# Patient Record
Sex: Female | Born: 1945 | Race: White | Hispanic: No | Smoking: Former smoker
Health system: Southern US, Community
[De-identification: ages and names within clinical notes are randomized; demographics above are authoritative.]

## PROBLEM LIST (undated history)

## (undated) DIAGNOSIS — G629 Polyneuropathy, unspecified: Secondary | ICD-10-CM

## (undated) DIAGNOSIS — F32A Depression, unspecified: Secondary | ICD-10-CM

## (undated) DIAGNOSIS — E039 Hypothyroidism, unspecified: Secondary | ICD-10-CM

## (undated) DIAGNOSIS — K219 Gastro-esophageal reflux disease without esophagitis: Secondary | ICD-10-CM

## (undated) DIAGNOSIS — F329 Major depressive disorder, single episode, unspecified: Secondary | ICD-10-CM

## (undated) DIAGNOSIS — J45909 Unspecified asthma, uncomplicated: Secondary | ICD-10-CM

## (undated) HISTORY — DX: Polyneuropathy, unspecified: G62.9

## (undated) HISTORY — PX: ABDOMINAL HYSTERECTOMY: SHX81

## (undated) HISTORY — PX: NASAL SINUS SURGERY: SHX719

## (undated) HISTORY — PX: LUMBAR LAMINECTOMY: SHX95

## (undated) HISTORY — PX: CATARACT EXTRACTION BILATERAL W/ ANTERIOR VITRECTOMY: SHX1304

## (undated) HISTORY — PX: NECK SURGERY: SHX720

## (undated) HISTORY — PX: CHOLECYSTECTOMY: SHX55

## (undated) HISTORY — PX: CERVICAL LAMINECTOMY: SHX94

## (undated) HISTORY — DX: Gastro-esophageal reflux disease without esophagitis: K21.9

---

## 2001-08-04 ENCOUNTER — Encounter: Payer: Self-pay | Admitting: Family Medicine

## 2001-08-04 ENCOUNTER — Encounter: Admission: RE | Admit: 2001-08-04 | Discharge: 2001-08-04 | Payer: Self-pay | Admitting: Family Medicine

## 2006-02-23 ENCOUNTER — Inpatient Hospital Stay (HOSPITAL_COMMUNITY): Admission: EM | Admit: 2006-02-23 | Discharge: 2006-03-01 | Payer: Self-pay | Admitting: Emergency Medicine

## 2006-08-06 ENCOUNTER — Emergency Department (HOSPITAL_COMMUNITY): Admission: EM | Admit: 2006-08-06 | Discharge: 2006-08-06 | Payer: Self-pay | Admitting: Emergency Medicine

## 2010-10-17 ENCOUNTER — Inpatient Hospital Stay (HOSPITAL_COMMUNITY): Admission: EM | Admit: 2010-10-17 | Discharge: 2010-10-26 | Payer: Self-pay | Admitting: Emergency Medicine

## 2010-10-19 DIAGNOSIS — F331 Major depressive disorder, recurrent, moderate: Secondary | ICD-10-CM

## 2010-10-20 ENCOUNTER — Encounter (INDEPENDENT_AMBULATORY_CARE_PROVIDER_SITE_OTHER): Payer: Self-pay | Admitting: Internal Medicine

## 2010-10-26 ENCOUNTER — Inpatient Hospital Stay (HOSPITAL_COMMUNITY)
Admission: EM | Admit: 2010-10-26 | Discharge: 2010-10-30 | Payer: Self-pay | Source: Ambulatory Visit | Admitting: Psychiatry

## 2010-10-26 ENCOUNTER — Ambulatory Visit: Payer: Self-pay | Admitting: Psychiatry

## 2011-02-09 LAB — CBC
HCT: 33.8 % — ABNORMAL LOW (ref 36.0–46.0)
Hemoglobin: 10.5 g/dL — ABNORMAL LOW (ref 12.0–15.0)
Hemoglobin: 12.2 g/dL (ref 12.0–15.0)
MCH: 31.3 pg (ref 26.0–34.0)
MCH: 31.5 pg (ref 26.0–34.0)
MCHC: 34.1 g/dL (ref 30.0–36.0)
MCHC: 34.3 g/dL (ref 30.0–36.0)
MCV: 90.4 fL (ref 78.0–100.0)
MCV: 91.1 fL (ref 78.0–100.0)
MCV: 92.5 fL (ref 78.0–100.0)
Platelets: 217 10*3/uL (ref 150–400)
Platelets: 250 10*3/uL (ref 150–400)
Platelets: 254 10*3/uL (ref 150–400)
Platelets: 271 10*3/uL (ref 150–400)
Platelets: 344 10*3/uL (ref 150–400)
RBC: 3.3 MIL/uL — ABNORMAL LOW (ref 3.87–5.11)
RBC: 3.53 MIL/uL — ABNORMAL LOW (ref 3.87–5.11)
RBC: 3.91 MIL/uL (ref 3.87–5.11)
RBC: 4.26 MIL/uL (ref 3.87–5.11)
RDW: 15 % (ref 11.5–15.5)
RDW: 15.8 % — ABNORMAL HIGH (ref 11.5–15.5)
RDW: 15.9 % — ABNORMAL HIGH (ref 11.5–15.5)
WBC: 10.6 10*3/uL — ABNORMAL HIGH (ref 4.0–10.5)
WBC: 11.9 10*3/uL — ABNORMAL HIGH (ref 4.0–10.5)
WBC: 9.2 10*3/uL (ref 4.0–10.5)

## 2011-02-09 LAB — BASIC METABOLIC PANEL
BUN: 1 mg/dL — ABNORMAL LOW (ref 6–23)
BUN: 1 mg/dL — ABNORMAL LOW (ref 6–23)
BUN: 1 mg/dL — ABNORMAL LOW (ref 6–23)
BUN: 2 mg/dL — ABNORMAL LOW (ref 6–23)
BUN: 4 mg/dL — ABNORMAL LOW (ref 6–23)
CO2: 25 mEq/L (ref 19–32)
CO2: 27 mEq/L (ref 19–32)
Calcium: 7.4 mg/dL — ABNORMAL LOW (ref 8.4–10.5)
Calcium: 8.4 mg/dL (ref 8.4–10.5)
Calcium: 8.5 mg/dL (ref 8.4–10.5)
Chloride: 100 mEq/L (ref 96–112)
Chloride: 106 mEq/L (ref 96–112)
Chloride: 107 mEq/L (ref 96–112)
Chloride: 107 mEq/L (ref 96–112)
Chloride: 109 mEq/L (ref 96–112)
Creatinine, Ser: 0.55 mg/dL (ref 0.4–1.2)
Creatinine, Ser: 0.65 mg/dL (ref 0.4–1.2)
Creatinine, Ser: 0.66 mg/dL (ref 0.4–1.2)
Creatinine, Ser: 0.68 mg/dL (ref 0.4–1.2)
Creatinine, Ser: 0.68 mg/dL (ref 0.4–1.2)
Creatinine, Ser: 0.71 mg/dL (ref 0.4–1.2)
GFR calc Af Amer: >60 mL/min (ref >60)
GFR calc Af Amer: >60 mL/min (ref >60)
GFR calc Af Amer: >60 mL/min (ref >60)
GFR calc non Af Amer: >60 mL/min (ref >60)
GFR calc non Af Amer: >60 mL/min (ref >60)
GFR calc non Af Amer: >60 mL/min (ref >60)
GFR calc non Af Amer: >60 mL/min (ref >60)
Glucose, Bld: 76 mg/dL (ref 70–99)
Glucose, Bld: 77 mg/dL (ref 70–99)
Glucose, Bld: 93 mg/dL (ref 70–99)
Potassium: 2.5 mEq/L — CL (ref 3.5–5.1)
Potassium: 3.6 mEq/L (ref 3.5–5.1)
Potassium: 4.6 mEq/L (ref 3.5–5.1)
Potassium: 5.3 mEq/L — ABNORMAL HIGH (ref 3.5–5.1)

## 2011-02-09 LAB — COMPREHENSIVE METABOLIC PANEL
ALT: 10 U/L (ref 0–35)
ALT: 11 U/L (ref 0–35)
AST: 28 U/L (ref 0–37)
AST: 31 U/L (ref 0–37)
Albumin: 1.9 g/dL — ABNORMAL LOW (ref 3.5–5.2)
Albumin: 2.6 g/dL — ABNORMAL LOW (ref 3.5–5.2)
Alkaline Phosphatase: 119 U/L — ABNORMAL HIGH (ref 39–117)
BUN: 1 mg/dL — ABNORMAL LOW (ref 6–23)
CO2: 21 mEq/L (ref 19–32)
Calcium: 8.3 mg/dL — ABNORMAL LOW (ref 8.4–10.5)
Chloride: 111 mEq/L (ref 96–112)
Chloride: 95 mEq/L — ABNORMAL LOW (ref 96–112)
GFR calc Af Amer: >60 mL/min (ref >60)
GFR calc non Af Amer: >60 mL/min (ref >60)
Potassium: 2.4 mEq/L — CL (ref 3.5–5.1)
Sodium: 140 mEq/L (ref 135–145)
Sodium: 142 mEq/L (ref 135–145)
Total Bilirubin: 0.8 mg/dL (ref 0.3–1.2)

## 2011-02-09 LAB — MAGNESIUM
Magnesium: 1.6 mg/dL (ref 1.5–2.5)
Magnesium: 1.9 mg/dL (ref 1.5–2.5)
Magnesium: 2.5 mg/dL (ref 1.5–2.5)
Magnesium: 3.1 mg/dL — ABNORMAL HIGH (ref 1.5–2.5)

## 2011-02-09 LAB — DIFFERENTIAL
Basophils Absolute: 0 10*3/uL (ref 0.0–0.1)
Basophils Relative: 0 % (ref 0–1)
Eosinophils Absolute: 0 10*3/uL (ref 0.0–0.7)
Eosinophils Relative: 0 % (ref 0–5)
Monocytes Absolute: 0.4 10*3/uL (ref 0.1–1.0)
Neutro Abs: 7.3 10*3/uL (ref 1.7–7.7)

## 2011-02-09 LAB — URINALYSIS, ROUTINE W REFLEX MICROSCOPIC
Bilirubin Urine: NEGATIVE
Glucose, UA: NEGATIVE mg/dL
Hgb urine dipstick: NEGATIVE
Protein, ur: 30 mg/dL — AB
Urobilinogen, UA: 1 mg/dL (ref 0.0–1.0)

## 2011-02-09 LAB — T4, FREE: Free T4: 0.78 ng/dL — ABNORMAL LOW (ref 0.80–1.80)

## 2011-02-09 LAB — CLOSTRIDIUM DIFFICILE BY PCR: Toxigenic C. Difficile by PCR: NOT DETECTED

## 2011-02-09 LAB — URINE MICROSCOPIC-ADD ON

## 2011-02-09 LAB — VITAMIN B12: Vitamin B-12: 737 pg/mL (ref 211–911)

## 2011-04-16 NOTE — H&P (Signed)
Elaine Byrd, Elaine Byrd                  ACCOUNT NO.:  [id number]   MEDICAL RECORD NO.:  [id number]          PATIENT TYPE:  INP   LOCATION:  6729                         FACILITY:  [facility]   PHYSICIAN:  Jonna L. Robb Matar, M.D.DATE OF BIRTH:  04/21/1946   DATE OF ADMISSION:  02/23/2006  DATE OF DISCHARGE:                                HISTORY & PHYSICAL   PRIMARY CARE PHYSICIAN:  Talmadge Coventry, M.D.   PSYCHIATRIST:  Dr. Lorin Picket   CHIEF COMPLAINT:  Cough.   HISTORY:  This 65 year old chronic asthmatic has had a nonproductive cough  for the last week-and-a-half with 3 days of increasing shortness of breath  and worsening asthma.  The cough is loose but nonproductive.  She has had  some low-grade chills and fever, some sinus congestion, some achiness.   PAST MEDICAL HISTORY:  1.  Asthma for the last 8 years.  2.  Allergies.  3.  Hypothyroidism.  4.  Bipolar.   OPERATIONS:  1.  Cervical and lumbar laminectomies.  2.  Tonsillectomy.  3.  Hysterectomy.  4.  Cholecystectomy.  5.  Sinus surgery.   FAMILY HISTORY:  Bipolar and anxiety.  Mother had COPD.  Father died of  MRSA.  One sister had severe degenerative disc disease and died by suicide.   SOCIAL HISTORY:  She has one daughter who has asthma and anxiety, a son who  has asthma and bipolar.  She smoked one pack per day and quit about 25 years  ago.  Alcohol:  She has three glasses of wine or beer on most nights but has  no history of DTs or withdrawal symptoms.   DRUG ALLERGIES:  1.  MORPHINE makes her go into [place].  2.  SULFA makes her itch.  3.  PENICILLIN gives her a rash.   MEDICATIONS:  1.  Advair 250/50 b.i.d.  2.  Albuterol p.r.n.  3.  Depakote 1000 daily.  4.  Lamictal 100 daily.  5.  Pepcid 20 daily.  6.  Synthroid 75 daily.  7.  Singulair 10 daily.  8.  Claritin D daily.  9.  Lexapro 20 daily.  10. Klonopin 1 mg up to t.i.d. p.r.n.   REVIEW OF SYSTEMS:  Twelve systems were reviewed.  Other than the  above,  there was no further history.   PHYSICAL EXAMINATION:  VITAL SIGNS:  Temperature 97 degrees, pulse 83,  respirations 20, blood pressure 105/79, 92% O2 saturation on 3 L.  GENERAL:  Well-developed Caucasian female in mild respiratory distress with  a cough.  HEENT:  Conjunctivae and lids are normal.  Pupils reactive.  Extraocular  movements are full.  She has normal hearing, mucosa, and pharynx.  NECK:  Show no mass or thyromegaly.  RESPIRATORY:  Effort slightly increased.  LUNGS:  Have some scattered expiratory wheeze. There are crackles on the  right side of the posterior lung field in the middle and some on the  axillary line.  There is no dullness.  HEART:  She has a regular rate and rhythm, normal S1 and S2, without  murmurs, rubs, or gallops.  There are no bruits, cyanosis, clubbing or  edema.  BREASTS:  Show no masses or tenderness, no discharge.  ABDOMEN:  Nontender.  No hepatosplenomegaly or hernia.  There is no cervical  adenopathy.  MUSCULOSKELETAL:  Muscle strength is 5/5.  Full range of motion all four  extremities.  SKIN:  Shows no rash, lesions or nodules.  NEUROLOGIC:  Cranial nerves are intact.  DTRs are 2+ in the knees.  Sensation is normal.  The patient is alert and oriented x3.  Normal memory,  judgment, pleasant and slightly humorous affect.   LABORATORY WORK:  Chest x-ray showed right middle lobe pneumonia and COPD.  White count 8.5 with a normal differential.  Normal CMP.  Alcohol is less  than 5.   IMPRESSION:  1.  Pneumonia.  Since she is allergic to PENICILLIN I will put her on      Avelox.  2.  Acute-on-chronic asthma.  I will give her just 3 days of lowered-dose      steroids since I do not want the corticosteroids to exacerbate any      psychiatric issues.  3.  Bipolar.  4.  Hypothyroidism.  Check her thyroid levels.  5.  History of daily alcohol use without any history of withdrawal or      delirium tremens.  She does have Klonopin to use  for agitation.      Jonna L. Robb Matar, M.D.  Electronically Signed    JLB/MEDQ  D:  02/23/2006  T:  02/24/2006  Job:  [id number]   cc:   Talmadge Coventry, M.D.  Fax: [phone]

## 2011-04-16 NOTE — Discharge Summary (Signed)
Elaine Byrd, Elaine Byrd                  ACCOUNT NO.:  [id number]   MEDICAL RECORD NO.:  [id number]          PATIENT TYPE:  INP   LOCATION:  6729                         FACILITY:  [facility]   PHYSICIAN:  Elliot Cousin, M.D.    DATE OF BIRTH:  June 12, 1946   DATE OF ADMISSION:  02/23/2006  DATE OF DISCHARGE:  02/28/2006                                 DISCHARGE SUMMARY   DISCHARGE DIAGNOSES:  1.  Right upper lobe and right lower lobe pneumonia.  2.  Subpleural 5 mm left lower lobe nodule per computed tomography scan of      the chest on February 24, 2006.  RECOMMEND FOLLOW-UP COMPUTED TOMOGRAPHY      SCAN IN 6 MONTHS.  3.  Superimposed asthma.  4.  Hypothyroidism.  5.  Mild hyperglycemia secondary to steroids.  6.  Bipolar disorder.   DISCHARGE MEDICATIONS:  1.  Avelox 400 mg daily for a total of 10 days of therapy.  2.  Albuterol MDI 2 puffs q.4 h. as needed.  3.  Advair inhaler 250/50, 1 puff b.i.d.  4.  Prednisone dose taper, take as directed.  5.  Singulair 10 mg daily.  6.  Depakote 1,000 mg daily.  7.  Lamictal 100 mg daily.  8.  Lexapro 20 mg daily.  9.  Pepcid 20 mg daily.  10. Klonopin 1 mg t.i.d. p.r.n.  11. Synthroid 75 mcg daily.   PROCEDURES PERFORMED:  1.  Chest x-ray on February 27, 2006.  The results revealed stable chest x-ray      to slightly decreased right upper lobe infiltrate.  Otherwise, no      significant change.  2.  CT scan of the chest without contrast on February 24, 2006.  The results      revealed mixed interstitial and air space opacities in the right upper      lobe, with bronchial thickening, concerning for pneumonia or      bronchopneumonia.  Associated right upper lobe posterior atelectasis.      Medial superior segment right lower lobe band-like consolidative      pneumonia versus atelectasis.  Subpleural 5 mm left lower lobe nodule.      Recommend followup in 6 months.  3.  Chest x-ray on February 23, 2006.  Findings consistent with right mid-lung      zone  pneumonia.  This may involve the superior segment of the right      lower lobe.  Chronic obstructive pulmonary disease.   HISTORY OF PRESENT ILLNESS:  The patient is a 65 year old lady with a past  medical history significant for asthma, who presented to the emergency  department on February 23, 2006 with a chief complaint of shortness of breath,  worsening asthma symptoms, and a nonproductive cough.  The patient also had  associated low-grade fever and chills.  When she was evaluated in the  emergency department, her chest x-ray revealed right middle lobe pneumonia  and COPD. The patient was therefore admitted for further evaluation and  management.   HOSPITAL COURSE:  1.  RIGHT-SIDED PNEUMONIA.  Blood  cultures were ordered from the emergency      department.  The patient was started on antibiotic therapy with Avelox      400 mg IV daily.  Further treatment was started with albuterol and      Atrovent nebulizers q.4 h.  Advair was also continued at 250/50 mg, 1      inhalation b.i.d.  Symptomatic treatment was started with Robitussin-DM      as needed as well as Humibid LA 1 tablet b.i.d.  Singulair was continued      for chronic asthma management.  Solu-Medrol at 60 mg IV was started as      well,  q.a.m. x3 days.   On admission, the patient's white blood cell count was within normal limits.  She was afebrile on admission as well.  The patient has remained afebrile  throughout the hospital course.  On hospital day 2, a decision was made to  evaluate the patient further with a CT scan of the chest without contrast.  The results are stated above. Essentially, the CT scan revealed right upper  lobe and right lower lobe pneumonia.  The CT scan also revealed a subpleural  5 mm left lower lobe nodule which needs to be followed up upon with a repeat  CT scan in approximately 6 months.  As of today, the patient has received 5  full days of Avelox therapy intravenously.  She remains afebrile.   She is  oxygenating 95% on room air.  With ambulation, her oxygenation saturation  falls to 93%.  The patient is still somewhat symptomatic, particularly with  bronchospasms.  Given the ongoing bronchospasms, steroid therapy was  continued with a prednisone dose taper.  The patient did receive 125 mg of  Solu-Medrol in the emergency department, followed by 60 mg of IV Solu-Medrol  for 3 days.   1.  SUPERIMPOSED ASTHMA.  The patient has a bronchospastic component      superimposed on pneumonia.  However, the bronchospasms are felt to be      more than likely the consequence of chronic asthma.  Management as      stated above.   1.  HYPOTHYROIDISM.  The patient was maintained on Synthroid 75 mcg daily.      Her TSH was mildly low at 0.326.  However, her free T4 was within normal      limits at 1.08.   1.  BIPOLAR DISORDER.  The patient was maintained on her chronic medications      for bipolar disorder.  A valproic acid level was assessed and found to      be therapeutic at 66.2.   1.  HYPERGLYCEMIA.  The patient was covered with a sliding scale insulin      regimen during her treatment with Solu-Medrol.  However, following the      discontinuation of Solu-Medrol, the sliding scale insulin regimen was      discontinued.   DISCHARGE DISPOSITION:  The patient is currently stable, although she still  has a few bronchospasms on exam.  She is still slightly symptomatic.  However, she has been ambulating in the room and the hallway.  Her oxygen  saturations on room air appear to be adequate.  A prednisone dose taper was  started today to hopefully ameliorate the bronchospasms.  A repeat chest x-  ray was ordered on today and revealed slight improvement of the right lung  infiltrates.  The patient will be discharged to home on her  chronic  medications as well as a total of 10 days of antibiotic therapy with Avelox. A prednisone taper will also be prescribed at the time of hospital   discharge.  The patient was advised to follow up with Dr. Smith Mince in 5-7  days.      Elliot Cousin, M.D.  Electronically Signed     DF/MEDQ  D:  02/27/2006  T:  02/28/2006  Job:  [id number]   cc:   Talmadge Coventry, M.D.  Fax: [phone]

## 2011-05-31 ENCOUNTER — Other Ambulatory Visit: Payer: Self-pay | Admitting: Cardiology

## 2011-05-31 DIAGNOSIS — I872 Venous insufficiency (chronic) (peripheral): Secondary | ICD-10-CM

## 2011-06-16 ENCOUNTER — Other Ambulatory Visit: Payer: Self-pay

## 2011-06-22 ENCOUNTER — Other Ambulatory Visit: Payer: Self-pay

## 2011-06-30 ENCOUNTER — Ambulatory Visit
Admission: RE | Admit: 2011-06-30 | Discharge: 2011-06-30 | Disposition: A | Discharge status: Home / Self Care | Payer: Medicaid Other | Source: Ambulatory Visit | Attending: Cardiology | Admitting: Cardiology

## 2011-06-30 VITALS — BP 126/73 | HR 76 | Temp 98.1°F | Resp 15 | Ht 62.0 in | Wt 168.0 lb

## 2011-06-30 DIAGNOSIS — I872 Venous insufficiency (chronic) (peripheral): Secondary | ICD-10-CM

## 2011-06-30 NOTE — Progress Notes (Signed)
Pt c/o bilateral peripheral edema, Left slightly more than Right x 18 mos.  Has become worse last 3-4 mos.  Describes burning, aching throbbing discomfort, restless leg syndrome.    Only minimal relief w/ elevation.  Uses walker for ambulation except w/in her house.

## 2013-02-01 ENCOUNTER — Emergency Department (HOSPITAL_COMMUNITY): Payer: Medicaid Other

## 2013-02-01 ENCOUNTER — Encounter (HOSPITAL_COMMUNITY): Payer: Self-pay | Admitting: Internal Medicine

## 2013-02-01 ENCOUNTER — Inpatient Hospital Stay (HOSPITAL_COMMUNITY)
Admission: EM | Admit: 2013-02-01 | Discharge: 2013-02-06 | DRG: 918 | Disposition: A | Discharge status: Home / Self Care | Payer: Medicaid Other | Attending: Internal Medicine | Admitting: Internal Medicine

## 2013-02-01 DIAGNOSIS — F32A Depression, unspecified: Secondary | ICD-10-CM

## 2013-02-01 DIAGNOSIS — E039 Hypothyroidism, unspecified: Secondary | ICD-10-CM

## 2013-02-01 DIAGNOSIS — F4321 Adjustment disorder with depressed mood: Secondary | ICD-10-CM | POA: Diagnosis present

## 2013-02-01 DIAGNOSIS — R45851 Suicidal ideations: Secondary | ICD-10-CM

## 2013-02-01 DIAGNOSIS — Z79899 Other long term (current) drug therapy: Secondary | ICD-10-CM

## 2013-02-01 DIAGNOSIS — Y92009 Unspecified place in unspecified non-institutional (private) residence as the place of occurrence of the external cause: Secondary | ICD-10-CM

## 2013-02-01 DIAGNOSIS — F329 Major depressive disorder, single episode, unspecified: Secondary | ICD-10-CM | POA: Diagnosis present

## 2013-02-01 DIAGNOSIS — T391X1A Poisoning by 4-Aminophenol derivatives, accidental (unintentional), initial encounter: Secondary | ICD-10-CM

## 2013-02-01 DIAGNOSIS — T1491XA Suicide attempt, initial encounter: Secondary | ICD-10-CM

## 2013-02-01 DIAGNOSIS — T424X4A Poisoning by benzodiazepines, undetermined, initial encounter: Secondary | ICD-10-CM | POA: Diagnosis present

## 2013-02-01 DIAGNOSIS — J45909 Unspecified asthma, uncomplicated: Secondary | ICD-10-CM

## 2013-02-01 DIAGNOSIS — R4182 Altered mental status, unspecified: Secondary | ICD-10-CM

## 2013-02-01 DIAGNOSIS — D72829 Elevated white blood cell count, unspecified: Secondary | ICD-10-CM | POA: Diagnosis present

## 2013-02-01 DIAGNOSIS — R112 Nausea with vomiting, unspecified: Secondary | ICD-10-CM | POA: Diagnosis present

## 2013-02-01 DIAGNOSIS — E876 Hypokalemia: Secondary | ICD-10-CM | POA: Diagnosis not present

## 2013-02-01 DIAGNOSIS — T394X2A Poisoning by antirheumatics, not elsewhere classified, intentional self-harm, initial encounter: Secondary | ICD-10-CM | POA: Diagnosis present

## 2013-02-01 DIAGNOSIS — T398X2A Poisoning by other nonopioid analgesics and antipyretics, not elsewhere classified, intentional self-harm, initial encounter: Secondary | ICD-10-CM | POA: Diagnosis present

## 2013-02-01 HISTORY — DX: Unspecified asthma, uncomplicated: J45.909

## 2013-02-01 HISTORY — DX: Depression, unspecified: F32.A

## 2013-02-01 HISTORY — DX: Major depressive disorder, single episode, unspecified: F32.9

## 2013-02-01 HISTORY — DX: Hypothyroidism, unspecified: E03.9

## 2013-02-01 LAB — CBC WITH DIFFERENTIAL/PLATELET
Eosinophils Absolute: 0 10*3/uL (ref 0.0–0.7)
Eosinophils Relative: 0 % (ref 0–5)
Hemoglobin: 15 g/dL (ref 12.0–15.0)
Lymphocytes Relative: 6 % — ABNORMAL LOW (ref 12–46)
Lymphs Abs: 1.1 10*3/uL (ref 0.7–4.0)
MCH: 31.8 pg (ref 26.0–34.0)
MCV: 93.8 fL (ref 78.0–100.0)
Monocytes Relative: 2 % — ABNORMAL LOW (ref 3–12)
Platelets: 348 10*3/uL (ref 150–400)
RBC: 4.71 MIL/uL (ref 3.87–5.11)
WBC: 18.7 10*3/uL — ABNORMAL HIGH (ref 4.0–10.5)

## 2013-02-01 LAB — RAPID URINE DRUG SCREEN, HOSP PERFORMED
Barbiturates: NOT DETECTED
Benzodiazepines: POSITIVE — AB
Cocaine: NOT DETECTED
Tetrahydrocannabinol: NOT DETECTED

## 2013-02-01 LAB — URINALYSIS, ROUTINE W REFLEX MICROSCOPIC
Glucose, UA: NEGATIVE mg/dL
Hgb urine dipstick: NEGATIVE
Protein, ur: NEGATIVE mg/dL
Specific Gravity, Urine: >1.046 — ABNORMAL HIGH (ref 1.005–1.030)
Urobilinogen, UA: 0.2 mg/dL (ref 0.0–1.0)

## 2013-02-01 LAB — URINE MICROSCOPIC-ADD ON

## 2013-02-01 LAB — COMPREHENSIVE METABOLIC PANEL
AST: 308 U/L — ABNORMAL HIGH (ref 0–37)
Albumin: 4.1 g/dL (ref 3.5–5.2)
BUN: 26 mg/dL — ABNORMAL HIGH (ref 6–23)
Calcium: 9.5 mg/dL (ref 8.4–10.5)
Creatinine, Ser: 0.87 mg/dL (ref 0.50–1.10)
Total Protein: 8.3 g/dL (ref 6.0–8.3)

## 2013-02-01 LAB — PROTIME-INR: Prothrombin Time: 14.7 seconds (ref 11.6–15.2)

## 2013-02-01 LAB — TROPONIN I: Troponin I: <0.3 ng/mL (ref <0.30)

## 2013-02-01 MED ORDER — ACETYLCYSTEINE LOAD VIA INFUSION
150.0000 mg/kg | Freq: Once | INTRAVENOUS | Status: DC
  Filled 2013-02-01: qty 330

## 2013-02-01 MED ORDER — LEVOFLOXACIN IN D5W 750 MG/150ML IV SOLN
750.0000 mg | Freq: Once | INTRAVENOUS | Status: AC
  Administered 2013-02-01: 750 mg via INTRAVENOUS
  Filled 2013-02-01: qty 150

## 2013-02-01 MED ORDER — DEXTROSE 5 % IV SOLN
15.0000 mg/kg/h | INTRAVENOUS | Status: DC
  Administered 2013-02-01 – 2013-02-02 (×2): 15 mg/kg/h via INTRAVENOUS
  Filled 2013-02-01 (×2): qty 200

## 2013-02-01 MED ORDER — ONDANSETRON HCL 4 MG/2ML IJ SOLN: 4.0000 mg | Freq: Three times a day (TID) | INTRAMUSCULAR | Status: AC | PRN

## 2013-02-01 MED ORDER — SODIUM CHLORIDE 0.9 % IV SOLN
INTRAVENOUS | Status: DC
  Administered 2013-02-01: via INTRAVENOUS

## 2013-02-01 MED ORDER — SODIUM CHLORIDE 0.9 % IV BOLUS (SEPSIS)
1000.0000 mL | Freq: Once | INTRAVENOUS | Status: AC
  Administered 2013-02-01: 1000 mL via INTRAVENOUS

## 2013-02-01 NOTE — ED Notes (Signed)
Pt becoming more alert.  Removed nasal airway, took pt off non-re breather mask and placed pt on 2L [place].  02 sats remain 100%, resps even and unlabored.

## 2013-02-01 NOTE — ED Provider Notes (Signed)
History     CSN: [id number]  Arrival date & time 02/01/13  2536   First MD Initiated Contact with Patient 02/01/13 1936      No chief complaint on file.   (Consider location/radiation/quality/duration/timing/severity/associated sxs/prior treatment) HPI Pt last seen normal at 2200 yesterday. Found by son lying on floor of her bed room, confused. No obvious trauma.Son found empty medicine bottles of vicodin, klonipin at her bedside. Pt is slurred and confused. Unable to contribute to history. Level 5 caveat.   No past medical history on file.  No past surgical history on file.  No family history on file.  History  Substance Use Topics  . Smoking status: Former Smoker -- 1.00 packs/day    Types: Cigarettes    Quit date: 06/29/1981  . Smokeless tobacco: Never Used  . Alcohol Use: 1.5 oz/week    3 drink(s) per week    OB History   Grav Para Term Preterm Abortions TAB SAB Ect Mult Living                  Review of Systems  Unable to perform ROS   Allergies  Morphine and related; Penicillins; and Sulfa antibiotics  Home Medications   Current Outpatient Rx  Name  Route  Sig  Dispense  Refill  . albuterol (PROVENTIL HFA;VENTOLIN HFA) 108 (90 BASE) MCG/ACT inhaler   Inhalation   Inhale 2 puffs into the lungs every 6 (six) hours as needed for wheezing. For wheezing         . ARIPiprazole (ABILIFY) 2 MG tablet   Oral   Take 2 mg by mouth at bedtime.          . budesonide-formoterol (SYMBICORT) 160-4.5 MCG/ACT inhaler   Inhalation   Inhale 2 puffs into the lungs 2 (two) times daily.         . clonazePAM (KLONOPIN) 1 MG tablet   Oral   Take 1 mg by mouth 3 (three) times daily as needed. For anxiety         . DULoxetine (CYMBALTA) 60 MG capsule   Oral   Take 60 mg by mouth daily.         . fluticasone-salmeterol (ADVAIR HFA) 115-21 MCG/ACT inhaler   Inhalation   Inhale 2 puffs into the lungs 2 (two) times daily.           . furosemide (LASIX) 20 MG  tablet   Oral   Take 20 mg by mouth every morning.          Marland Kitchen HYDROcodone-acetaminophen (NORCO/VICODIN) 5-325 MG per tablet   Oral   Take 1 tablet by mouth every 8 (eight) hours as needed for pain. For pain         . levothyroxine (SYNTHROID, LEVOTHROID) 50 MCG tablet   Oral   Take 50 mcg by mouth daily.           . potassium chloride (KLOR-CON) 10 MEQ CR tablet   Oral   Take 10 mEq by mouth every morning.          . zafirlukast (ACCOLATE) 20 MG tablet   Oral   Take 20 mg by mouth 2 (two) times daily.           BP 104/73  Pulse 122  Resp 12  Wt 194 lb 0.1 oz (88 kg)  BMI 35.47 kg/m2  SpO2 100%  Physical Exam  Nursing note and vitals reviewed. Constitutional: She appears well-developed and well-nourished. No distress.  HENT:  Head: Normocephalic and atraumatic.  Mouth/Throat: Oropharynx is clear and moist.  Eyes: EOM are normal. Pupils are equal, round, and reactive to light.  Neck: Normal range of motion. Neck supple.  No posterior midline tenderness, or deformity  Cardiovascular: Normal rate and regular rhythm.   Pulmonary/Chest: Effort normal and breath sounds normal. No respiratory distress. She has no wheezes. She has no rales. She exhibits no tenderness.  Abdominal: Soft. Bowel sounds are normal. She exhibits no distension and no mass. There is no tenderness. There is no rebound and no guarding.  Musculoskeletal: Normal range of motion. She exhibits no edema and no tenderness.  Neurological:  Oriented to person, slurred speech. Following simple commands, moves all ext  Skin: Skin is warm and dry. No rash noted. No erythema.    ED Course  Procedures (including critical care time)  Labs Reviewed  COMPREHENSIVE METABOLIC PANEL - Abnormal; Notable for the following:    Glucose, Bld 108 (*)    BUN 26 (*)    AST 308 (*)    ALT 247 (*)    Alkaline Phosphatase 126 (*)    GFR calc non Af Amer 69 (*)    GFR calc Af Amer 80 (*)    All other components  within normal limits  CBC WITH DIFFERENTIAL - Abnormal; Notable for the following:    WBC 18.7 (*)    Neutrophils Relative 91 (*)    Neutro Abs 17.1 (*)    Lymphocytes Relative 6 (*)    Monocytes Relative 2 (*)    All other components within normal limits  URINALYSIS, ROUTINE W REFLEX MICROSCOPIC - Abnormal; Notable for the following:    Color, Urine AMBER (*)    Specific Gravity, Urine >1.046 (*)    Bilirubin Urine SMALL (*)    Leukocytes, UA SMALL (*)    All other components within normal limits  URINE RAPID DRUG SCREEN (HOSP PERFORMED) - Abnormal; Notable for the following:    Opiates POSITIVE (*)    Benzodiazepines POSITIVE (*)    All other components within normal limits  CK - Abnormal; Notable for the following:    Total CK 206 (*)    All other components within normal limits  URINE MICROSCOPIC-ADD ON - Abnormal; Notable for the following:    Squamous Epithelial / LPF FEW (*)    Bacteria, UA FEW (*)    All other components within normal limits  ACETAMINOPHEN LEVEL - Abnormal; Notable for the following:    Acetaminophen (Tylenol), Serum 272.6 (*)    All other components within normal limits  SALICYLATE LEVEL - Abnormal; Notable for the following:    Salicylate Lvl <2.0 (*)    All other components within normal limits  URINE CULTURE  TROPONIN I  PROTIME-INR  AMMONIA  ETHANOL   Dg Chest 1 View  02/01/2013  *RADIOLOGY REPORT*  Clinical Data: Shortness of breath.  CHEST - 1 VIEW  Comparison: PA and lateral chest 02/27/2006.  CT chest 02/24/2006.  Findings: Lung volumes are low.  There is focal airspace disease in the left mid lung zone.  Right lung appears grossly clear. Cardiomegaly is noted.  IMPRESSION:  1.  Focal airspace disease left mid lung zone worrisome for pneumonia. 2.  Cardiomegaly.   Original Report Authenticated By: Holley Dexter, M.D.    Ct Head Wo Contrast  02/01/2013  *RADIOLOGY REPORT*  Clinical Data: Neighbor found pt laying on ground next to bed,  lethargic and unresponsive  CT HEAD WITHOUT CONTRAST  Technique:  Contiguous axial images were obtained from the base of the skull through the vertex without contrast.  Comparison: None.  Findings: No acute intracranial hemorrhage.  No focal mass lesion. No CT evidence of acute infarction.   No midline shift or mass effect.  No hydrocephalus.  Basilar cisterns are patent.  Mild cortical atrophy.  Mastoid air cells are clear.  There is mild mucosal periosteal thickening in the maxillary sinuses.  IMPRESSION:  1.  No acute intracranial findings. 2.  Mild atrophy. 3.  Chronic sinus disease.   Original Report Authenticated By: Genevive Bi, M.D.    Ct Cervical Spine Wo Contrast  02/01/2013  *RADIOLOGY REPORT*  Clinical Data: Found on ground; lethargic and unresponsive. Concern for cervical spine injury.  CT CERVICAL SPINE WITHOUT CONTRAST  Technique:  Multidetector CT imaging of the cervical spine was performed. Multiplanar CT image reconstructions were also generated.  Comparison: None.  Findings: There is no evidence of fracture or subluxation. Vertebral bodies demonstrate normal height and alignment.  There is multilevel disc space narrowing along the cervical spine, with associated anterior and posterior disc osteophyte complexes and underlying facet disease.  Prevertebral soft tissues are within normal limits.  The thyroid gland is unremarkable in appearance.  Mild atelectasis is noted at the visualized lung apices.  No significant soft tissue abnormalities are seen.  Dense calcification is suggested along the right innominate artery.  IMPRESSION:  1.  No evidence of fracture or subluxation along the cervical spine. 2.  Mild degenerative change noted along the cervical spine. 3.  Mild atelectasis at the visualized lung apices.   Original Report Authenticated By: Tonia Ghent, M.D.      1. Tylenol overdose, initial encounter   2. Suicide attempt   3. Altered mental status    CRITICAL CARE Performed  by: Ranae Palms, DAVID   Total critical care time: 30  Critical care time was exclusive of separately billable procedures and treating other patients.  Critical care was necessary to treat or prevent imminent or life-threatening deterioration.  Critical care was time spent personally by me on the following activities: development of treatment plan with patient and/or surrogate as well as nursing, discussions with consultants, evaluation of patient's response to treatment, examination of patient, obtaining history from patient or surrogate, ordering and performing treatments and interventions, ordering and review of laboratory studies, ordering and review of radiographic studies, pulse oximetry and re-evaluation of patient's condition.    Date: 02/01/2013  Rate: 84  Rhythm: normal sinus rhythm  QRS Axis: normal  Intervals: normal  ST/T Wave abnormalities: normal  Conduction Disutrbances:none  Narrative Interpretation:   Old EKG Reviewed: unchanged  MDM  Question OD. Pt protecting her airway. C-collar applied in ED.   Discussed with Poison control. Recommend IV loading with mucomyst  Critical Care states they will see but advise to have triad admit. Triad to see in ED    Loren Racer, MD 02/01/13 [phone]

## 2013-02-01 NOTE — ED Notes (Signed)
Patient transported to CT 

## 2013-02-01 NOTE — H&P (Signed)
[facility] History and Physical  Elaine Byrd:[id number] DOB: August 25, 1946 DOA: 02/01/2013  Referring physician: Dr.Yelverton. PCP: Ron Parker, MD  Specialists: None.  Chief Complaint: Found on the floor.  HPI: AVALEIGH Byrd is a 67 y.o. female with history of hypothyroidism asthma and depression was brought to the ER after patient was found on the floor lethargic. As per patient's son and daughter who provided the history they had contacted the patient last night and once patient was not answering her phone they had called the police. When the police had gone to the house she was stating that she was fine and at that point she had talked to her daughter stating that she had taken some NyQuil and was drowsy. Today again they tried to reach her over the phone when there was no answer her son went directly to check on her at her house when she was found to be on the floor lethargic. Her pain medication Vicodin bottles and Klonopin bottles were empty which was just refilled a week ago. In the ER patient was found to have elevated LFTs normal INR and elevated Tylenol level. Patient at this time has been admitted for Tylenol overdose with suicidal ideation. Patient still has suicidal ideation. Patient presently is mildly a can states that she did throw up twice today. Has mild epigastric discomfort but denies any chest pain or shortness of breath. Is able to follow commands and moves all extremities.  Patient has had taken Vicodin 5/325 tablets and Klonopin 1 mg tablets along with 3 tablets of Abilify. Patient had at least a month's supply of Vicodin and Klonopin in the bottle.  Poison control was contacted by the ER physician and at this time patient has been started on IV Mucomyst for Tylenol overdose.  Review of Systems: As presented in the history of present illness nothing else significant.  Past Medical History  Diagnosis Date  . Depression   . Asthma   . Hypothyroidism     Past Surgical History  Procedure Laterality Date  . Brain surgery    . Neck surgery    . Abdominal hysterectomy    . Cholecystectomy     Social History:  reports that she quit smoking about 31 years ago. Her smoking use included Cigarettes. She smoked 1.00 pack per day. She has never used smokeless tobacco. She reports that she drinks about 1.5 ounces of alcohol per week. She reports that she does not use illicit drugs. Lives at home alone. where does patient live--home, ALF, SNF? and with whom if at home? Can do ADLs. Can patient participate in ADLs?  Allergies  Allergen Reactions  . Morphine And Related Other (See Comments)    Unknown reaction per family  . Penicillins Other (See Comments)    Unknown reaction per family  . Sulfa Antibiotics Other (See Comments)    Unknown reaction per family    Family History  Problem Relation Age of Onset  . Depression Sister       Prior to Admission medications   Medication Sig Start Date End Date Taking? Authorizing Provider  albuterol (PROVENTIL HFA;VENTOLIN HFA) 108 (90 BASE) MCG/ACT inhaler Inhale 2 puffs into the lungs every 6 (six) hours as needed for wheezing. For wheezing   Yes Historical Provider, MD  ARIPiprazole (ABILIFY) 2 MG tablet Take 2 mg by mouth at bedtime.    Yes Historical Provider, MD  budesonide-formoterol (SYMBICORT) 160-4.5 MCG/ACT inhaler Inhale 2 puffs into the lungs 2 (two) times daily.  Yes Historical Provider, MD  clonazePAM (KLONOPIN) 1 MG tablet Take 1 mg by mouth 3 (three) times daily as needed. For anxiety   Yes Historical Provider, MD  DULoxetine (CYMBALTA) 60 MG capsule Take 60 mg by mouth daily.   Yes Historical Provider, MD  fluticasone-salmeterol (ADVAIR HFA) 115-21 MCG/ACT inhaler Inhale 2 puffs into the lungs 2 (two) times daily.     Yes Historical Provider, MD  furosemide (LASIX) 20 MG tablet Take 20 mg by mouth every morning.    Yes Historical Provider, MD  HYDROcodone-acetaminophen (NORCO/VICODIN)  5-325 MG per tablet Take 1 tablet by mouth every 8 (eight) hours as needed for pain. For pain   Yes Historical Provider, MD  levothyroxine (SYNTHROID, LEVOTHROID) 50 MCG tablet Take 50 mcg by mouth daily.     Yes Historical Provider, MD  potassium chloride (KLOR-CON) 10 MEQ CR tablet Take 10 mEq by mouth every morning.    Yes Historical Provider, MD  zafirlukast (ACCOLATE) 20 MG tablet Take 20 mg by mouth 2 (two) times daily.   Yes Historical Provider, MD   Physical Exam: Filed Vitals:   02/01/13 2200 02/01/13 2215 02/01/13 2242 02/01/13 2300  BP: 104/69 101/64 104/73   Pulse: 90 85 122   Resp: 13 11 12    Weight:    88 kg (194 lb 0.1 oz)  SpO2: 97% 95% 100%      General:  Well-developed and nourished.  Eyes: Anicteric no pallor.  ENT: No discharge from the ears eyes nose and mouth.  Neck: No mass felt.  Cardiovascular: S1-S2 heard.  Respiratory: No rhonchi or crepitations.  Abdomen: Soft mildly distended nontender. Bowel sounds present.  Skin: No rash.  Musculoskeletal: Has pain in the lower extremities when she tries to move but no obvious hematoma or limitation of movements.  Psychiatric: Patient is suicidal.       Neurologic: Patient is awake oriented to time place and person but appears little drowsy. Moves all extremities. Labs on Admission:  Basic Metabolic Panel:  Recent Labs Lab 02/01/13 1947  NA 137  K 4.2  CL 98  CO2 20  GLUCOSE 108*  BUN 26*  CREATININE 0.87  CALCIUM 9.5   Liver Function Tests:  Recent Labs Lab 02/01/13 1947  AST 308*  ALT 247*  ALKPHOS 126*  BILITOT 0.8  PROT 8.3  ALBUMIN 4.1   No results found for this basename: LIPASE, AMYLASE,  in the last 168 hours  Recent Labs Lab 02/01/13 1946  AMMONIA 36   CBC:  Recent Labs Lab 02/01/13 1947  WBC 18.7*  NEUTROABS 17.1*  HGB 15.0  HCT 44.2  MCV 93.8  PLT 348   Cardiac Enzymes:  Recent Labs Lab 02/01/13 1947  CKTOTAL 206*  TROPONINI <0.30    BNP (last 3  results) No results found for this basename: PROBNP,  in the last 8760 hours CBG: No results found for this basename: GLUCAP,  in the last 168 hours  Radiological Exams on Admission: Dg Chest 1 View  02/01/2013  *RADIOLOGY REPORT*  Clinical Data: Shortness of breath.  CHEST - 1 VIEW  Comparison: PA and lateral chest 02/27/2006.  CT chest 02/24/2006.  Findings: Lung volumes are low.  There is focal airspace disease in the left mid lung zone.  Right lung appears grossly clear. Cardiomegaly is noted.  IMPRESSION:  1.  Focal airspace disease left mid lung zone worrisome for pneumonia. 2.  Cardiomegaly.   Original Report Authenticated By: Holley Dexter, M.D.  Ct Head Wo Contrast  02/01/2013  *RADIOLOGY REPORT*  Clinical Data: Neighbor found pt laying on ground next to bed, lethargic and unresponsive  CT HEAD WITHOUT CONTRAST  Technique:  Contiguous axial images were obtained from the base of the skull through the vertex without contrast.  Comparison: None.  Findings: No acute intracranial hemorrhage.  No focal mass lesion. No CT evidence of acute infarction.   No midline shift or mass effect.  No hydrocephalus.  Basilar cisterns are patent.  Mild cortical atrophy.  Mastoid air cells are clear.  There is mild mucosal periosteal thickening in the maxillary sinuses.  IMPRESSION:  1.  No acute intracranial findings. 2.  Mild atrophy. 3.  Chronic sinus disease.   Original Report Authenticated By: Genevive Bi, M.D.    Ct Cervical Spine Wo Contrast  02/01/2013  *RADIOLOGY REPORT*  Clinical Data: Found on ground; lethargic and unresponsive. Concern for cervical spine injury.  CT CERVICAL SPINE WITHOUT CONTRAST  Technique:  Multidetector CT imaging of the cervical spine was performed. Multiplanar CT image reconstructions were also generated.  Comparison: None.  Findings: There is no evidence of fracture or subluxation. Vertebral bodies demonstrate normal height and alignment.  There is multilevel disc space  narrowing along the cervical spine, with associated anterior and posterior disc osteophyte complexes and underlying facet disease.  Prevertebral soft tissues are within normal limits.  The thyroid gland is unremarkable in appearance.  Mild atelectasis is noted at the visualized lung apices.  No significant soft tissue abnormalities are seen.  Dense calcification is suggested along the right innominate artery.  IMPRESSION:  1.  No evidence of fracture or subluxation along the cervical spine. 2.  Mild degenerative change noted along the cervical spine. 3.  Mild atelectasis at the visualized lung apices.   Original Report Authenticated By: Tonia Ghent, M.D.     Assessment/Plan Principal Problem:   Tylenol overdose Active Problems:   Suicidal ideation   Depression   Hypothyroidism   Asthma   1. Tylenol overdose with suicidal ideation - patient has been started on IV Mucomyst. Repeat LFTs INR and Tylenol level at least one hour before stopping the 24 hour dose of IV Mucomyst. I have also ordered a.m. labs of metabolic panel LFTs INR and Tylenol levels. Closely follow patient's mental status. Continue hydration. 2. Possible pneumonia - continue with Levaquin. 3. Leukocytosis - probably secondary to stress and pneumonia. 4. Hypothyroidism - continue Synthroid check TSH. 5. Depression with suicidal ideation - patient is placed with sitter for suicidal precautions. Consult psychiatry. 6. Bronchial asthma - presently not wheezing. Continue nebulizer as needed. 7. History of alcoholism - patient's son states that patient's alcoholism has much improved now and she only drinks one or 2 drinks per week. I have placed patient on thiamine for now.  I have placed patient's psychiatric medications and pain medications on hold for now.   Code Status:  Full code. Family Communication:  Patient's son and daughter at the bedside.  Disposition Plan:  Admit to inpatient.   Elsia Lasota N. [facility] Pager [phone].  If 7PM-7AM, please contact night-coverage [email or web address] Password [facility] 02/01/2013, 11:55 PM

## 2013-02-01 NOTE — ED Notes (Signed)
Per EMS:  Neighbor found pt laying on ground next to bed, lethargic and unresponsive.  Upon EMS' arrival she said she was fine, had no complaints, just that she hurt.  However, pt was altered.  She only knew what her birthday is and what her name is, disoriented to everything else.  Blood present around the mouth, strong ammonia smell on breath.  Pt also warm to touch.  EMS inserted nasal airway in route due to pt's snoring respirations.

## 2013-02-02 ENCOUNTER — Inpatient Hospital Stay (HOSPITAL_COMMUNITY): Payer: Medicaid Other

## 2013-02-02 DIAGNOSIS — X838XXA Intentional self-harm by other specified means, initial encounter: Secondary | ICD-10-CM

## 2013-02-02 DIAGNOSIS — R4182 Altered mental status, unspecified: Secondary | ICD-10-CM

## 2013-02-02 DIAGNOSIS — E039 Hypothyroidism, unspecified: Secondary | ICD-10-CM

## 2013-02-02 LAB — HEPATIC FUNCTION PANEL
ALT: 823 U/L — ABNORMAL HIGH (ref 0–35)
ALT: 1603 U/L — ABNORMAL HIGH (ref 0–35)
AST: 2254 U/L — ABNORMAL HIGH (ref 0–37)
Albumin: 3.2 g/dL — ABNORMAL LOW (ref 3.5–5.2)
Albumin: 3 g/dL — ABNORMAL LOW (ref 3.5–5.2)
Alkaline Phosphatase: 96 U/L (ref 39–117)
Alkaline Phosphatase: 90 U/L (ref 39–117)
Bilirubin, Direct: 0.2 mg/dL (ref 0.0–0.3)
Bilirubin, Direct: 0.1 mg/dL (ref 0.0–0.3)
Indirect Bilirubin: 0.6 mg/dL (ref 0.3–0.9)
Total Protein: 6.7 g/dL (ref 6.0–8.3)
Total Protein: 6.5 g/dL (ref 6.0–8.3)

## 2013-02-02 LAB — BLOOD GAS, ARTERIAL
O2 Content: 2 L/min
Patient temperature: 98.6
pH, Arterial: 7.331 — ABNORMAL LOW (ref 7.350–7.450)

## 2013-02-02 LAB — BASIC METABOLIC PANEL
BUN: 21 mg/dL (ref 6–23)
CO2: 23 mEq/L (ref 19–32)
CO2: 21 mEq/L (ref 19–32)
Calcium: 8.8 mg/dL (ref 8.4–10.5)
Calcium: 9 mg/dL (ref 8.4–10.5)
Calcium: 8.8 mg/dL (ref 8.4–10.5)
Creatinine, Ser: 0.54 mg/dL (ref 0.50–1.10)
GFR calc Af Amer: >90 mL/min (ref >90)
GFR calc Af Amer: >90 mL/min (ref >90)
GFR calc non Af Amer: >90 mL/min (ref >90)
GFR calc non Af Amer: >90 mL/min (ref >90)
Glucose, Bld: 90 mg/dL (ref 70–99)
Potassium: 4 mEq/L (ref 3.5–5.1)
Sodium: 140 mEq/L (ref 135–145)
Sodium: 140 mEq/L (ref 135–145)

## 2013-02-02 LAB — PROTIME-INR
INR: 1.32 (ref 0.00–1.49)
Prothrombin Time: 16.1 seconds — ABNORMAL HIGH (ref 11.6–15.2)
Prothrombin Time: 16.9 seconds — ABNORMAL HIGH (ref 11.6–15.2)

## 2013-02-02 LAB — TROPONIN I: Troponin I: <0.3 ng/mL (ref <0.30)

## 2013-02-02 LAB — ACETAMINOPHEN LEVEL: Acetaminophen (Tylenol), Serum: 94.3 ug/mL — ABNORMAL HIGH (ref 10–30)

## 2013-02-02 LAB — GLUCOSE, CAPILLARY: Glucose-Capillary: 104 mg/dL — ABNORMAL HIGH (ref 70–99)

## 2013-02-02 LAB — CBC
Platelets: 233 10*3/uL (ref 150–400)
RBC: 4.15 MIL/uL (ref 3.87–5.11)
WBC: 11 10*3/uL — ABNORMAL HIGH (ref 4.0–10.5)

## 2013-02-02 MED ORDER — FOLIC ACID 5 MG/ML IJ SOLN
1.0000 mg | Freq: Every day | INTRAMUSCULAR | Status: DC
  Administered 2013-02-02 – 2013-02-03 (×2): 1 mg via INTRAVENOUS
  Filled 2013-02-02 (×2): qty 0.2

## 2013-02-02 MED ORDER — ONDANSETRON HCL 4 MG PO TABS: 4.0000 mg | ORAL_TABLET | Freq: Four times a day (QID) | ORAL | Status: DC | PRN

## 2013-02-02 MED ORDER — SODIUM CHLORIDE 0.9 % IJ SOLN: 3.0000 mL | Freq: Two times a day (BID) | INTRAMUSCULAR | Status: DC

## 2013-02-02 MED ORDER — LEVALBUTEROL HCL 0.63 MG/3ML IN NEBU
0.6300 mg | INHALATION_SOLUTION | Freq: Four times a day (QID) | RESPIRATORY_TRACT | Status: DC
  Administered 2013-02-02: 0.63 mg via RESPIRATORY_TRACT
  Filled 2013-02-02 (×6): qty 3

## 2013-02-02 MED ORDER — ALBUTEROL SULFATE (5 MG/ML) 0.5% IN NEBU: 2.5000 mg | INHALATION_SOLUTION | RESPIRATORY_TRACT | Status: DC | PRN

## 2013-02-02 MED ORDER — ONDANSETRON HCL 4 MG/2ML IJ SOLN: 4.0000 mg | Freq: Four times a day (QID) | INTRAMUSCULAR | Status: DC | PRN

## 2013-02-02 MED ORDER — POTASSIUM CHLORIDE CRYS ER 20 MEQ PO TBCR
40.0000 meq | EXTENDED_RELEASE_TABLET | Freq: Two times a day (BID) | ORAL | Status: AC
  Administered 2013-02-02 – 2013-02-03 (×3): 40 meq via ORAL
  Filled 2013-02-02 (×4): qty 2

## 2013-02-02 MED ORDER — K PHOS MONO-SOD PHOS DI & MONO 155-852-130 MG PO TABS
500.0000 mg | ORAL_TABLET | Freq: Three times a day (TID) | ORAL | Status: DC
  Administered 2013-02-02 – 2013-02-06 (×12): 500 mg via ORAL
  Filled 2013-02-02 (×13): qty 2

## 2013-02-02 MED ORDER — LEVALBUTEROL HCL 0.63 MG/3ML IN NEBU
0.6300 mg | INHALATION_SOLUTION | Freq: Four times a day (QID) | RESPIRATORY_TRACT | Status: DC | PRN
  Filled 2013-02-02: qty 3

## 2013-02-02 MED ORDER — LEVOTHYROXINE SODIUM 100 MCG IV SOLR
25.0000 ug | Freq: Every day | INTRAVENOUS | Status: DC
  Administered 2013-02-02: 25 ug via INTRAVENOUS
  Administered 2013-02-03: 17:00:00 via INTRAVENOUS
  Filled 2013-02-02 (×2): qty 5

## 2013-02-02 MED ORDER — ACETAMINOPHEN 650 MG RE SUPP: 650.0000 mg | Freq: Four times a day (QID) | RECTAL | Status: DC | PRN

## 2013-02-02 MED ORDER — ACETAMINOPHEN 325 MG PO TABS: 650.0000 mg | ORAL_TABLET | Freq: Four times a day (QID) | ORAL | Status: DC | PRN

## 2013-02-02 MED ORDER — SODIUM CHLORIDE 0.9 % IV SOLN
INTRAVENOUS | Status: AC
Start: 2013-02-02 — End: 2013-02-03
  Administered 2013-02-02 (×2): via INTRAVENOUS

## 2013-02-02 MED ORDER — THIAMINE HCL 100 MG/ML IJ SOLN
100.0000 mg | Freq: Every day | INTRAMUSCULAR | Status: DC
  Filled 2013-02-02: qty 1

## 2013-02-02 MED ORDER — THIAMINE HCL 100 MG/ML IJ SOLN
100.0000 mg | Freq: Every day | INTRAMUSCULAR | Status: AC
  Administered 2013-02-02 – 2013-02-03 (×2): 100 mg via INTRAVENOUS
  Filled 2013-02-02 (×2): qty 1

## 2013-02-02 NOTE — Progress Notes (Signed)
Critical Lab for Phos of 1.0 called.  Paged Dr. Sharon Seller.

## 2013-02-02 NOTE — Clinical Social Work Psych Assess (Signed)
     Clinical Social Work Department CLINICAL SOCIAL WORK PSYCHIATRY SERVICE LINE ASSESSMENT 02/02/2013  Patient:  Elaine Byrd  Account:  [id number]  Admit Date:  02/01/2013  Clinical Social Worker:  Margaree Mackintosh  Date/Time:  02/02/2013 12:02 PM Referred by:  Physician  Date referred:  02/02/2013 Reason for Referral  Crisis Intervention   Presenting Symptoms/Problems (In the persons/familys own words):   Pt admitted to hospital after taking a "large amount" of tylenol, vicodin, and clonapin.    Abuse/Neglect/Trauma Comments:   Psychiatric History (check all that apply)  Denies history   Psychiatric medications:  Current Mental Health Hospitalizations/Previous Mental Health History:   Current Ezekiel Menzer:   Place and Date:   Current Medications:   Previous Impatient Admission/Date/Reason:   Emotional Health / Current Symptoms    Suicide/Self Harm  Suicide attempt in past (date/description)   Suicide attempt in the past:   March 2014.   Other harmful behavior:    Other Psychotic/Dissociative Symptoms:    Attention/Behavioral Symptoms  Impulsive   Other Attention / Behavioral Symptoms:    Cognitive Impairment  Unable to accurately assess   Other Cognitive Impairment:    Mood and Adjustment  Guarded    Stress, Anxiety, Trauma, Any Recent Loss/Stressor  Anxiety  Avoidance  Compulsive behavior   Anxiety (frequency):   Phobia (specify):   Compulsive behavior (specify):   Obsessive behavior (specify):   Other:    SBIRT completed (please refer for detailed history):  N  Self-reported substance use:   Urinary Drug Screen Completed:  Y Alcohol level:    Environmental/Housing/Living Arrangement  Stable housing   Who is in the home:   Lives alone.   Emergency contact:   Patients Strengths and Goals (patients own words):   Pt unable to verbalize at this time.   Clinical Social Workers Interpretive Summary:   Visual merchandiser met with  pt at bedside.  CSW introduced self, explained role, and provided support.  CSW utilized solution focused interventions.  Pt shared that she lives alone and has a dog and a Counselling psychologist.  Pt reports that "things just became too much".  Pt not able/willing to verbalize triggers but states, "It's a long story".  Pt states this is her first suicide attempt.  Pt endorses having suicidal thoughts in past.  Pt states she is "usually able to see it coming, then I take my medicine".  Pt states she "couldn't wait for my (her) medicine to work this time" and elected to "take a whole bunch of tylenol".  When asked about support system, pt hesitantly states her children are supportive "right now, but we will see what it is in a few months".  Pt also shares that she does not feel she is able to verbalize her feelings with her children as she is dependent on them for rides.  Per pt, when her children are upset with her, they do not drive her places.    Pt states she took care of her mother for "3 years" and had access to a Child psychotherapist through care giver support but has never seen a therapist.  Pt is open to an inpatient psych admit "as long as it's not a long time".  Pt is worried about her pets at home and states her children are currently agreeable to watching the pets.    CSW staffed case with Psych MD.   Disposition:  Recommend Psych CSW continuing to support while in hospital

## 2013-02-02 NOTE — ED Notes (Signed)
To call report to [phone], nurse unavailable.  Left my number for them to call back in 10-15 min.

## 2013-02-02 NOTE — Consult Note (Signed)
PULMONARY  / CRITICAL CARE MEDICINE  Name: Elaine Byrd MRN: [id number] DOB: [birth year]    ADMISSION DATE:  02/01/2013 CONSULTATION DATE:  02/02/13   REFERRING MD :  Sharyne Peach  CHIEF COMPLAINT:  Tylenol ingestion  BRIEF PATIENT DESCRIPTION: 11, hx depression, hypothyroid, ? Asthma. Admitted 3/6 pm after intentional ingestion benzos, narcs, tylenol. Started n-AC approx 23:30 3/6. PCCM consulted 3/7.   SIGNIFICANT EVENTS / STUDIES:  MELD 3/7 am >> 10  LINES / TUBES: none  CULTURES: Urine 3/6 >> Not collected  ANTIBIOTICS: levaquin 3/6 >> single dose   HISTORY OF PRESENT ILLNESS:  67 yo former tobacco, current moderate EtOH, hx depression, ? Asthma. Found down by family 3/6 pm, poorly responsive, lethargic. They noted empty vicodin, clonazepam bottles that had been filled x 1 week. Initial tylenol level 273 (at ~4h time point), MELD 10. More oriented 3/7 am and able to tell me that she took vicodin, benzos and a "large part" of a large, regular strength tylenol bottle. # pills unclear. IV n-AC was started approx midnight 02/02/13.   PAST MEDICAL HISTORY :  Past Medical History  Diagnosis Date  . Depression   . Asthma   . Hypothyroidism    Past Surgical History  Procedure Laterality Date  . Brain surgery    . Neck surgery    . Abdominal hysterectomy    . Cholecystectomy     Prior to Admission medications   Medication Sig Start Date End Date Taking? Authorizing Provider  albuterol (PROVENTIL HFA;VENTOLIN HFA) 108 (90 BASE) MCG/ACT inhaler Inhale 2 puffs into the lungs every 6 (six) hours as needed for wheezing. For wheezing   Yes Historical Provider, MD  ARIPiprazole (ABILIFY) 2 MG tablet Take 2 mg by mouth at bedtime.    Yes Historical Provider, MD  budesonide-formoterol (SYMBICORT) 160-4.5 MCG/ACT inhaler Inhale 2 puffs into the lungs 2 (two) times daily.   Yes Historical Provider, MD  clonazePAM (KLONOPIN) 1 MG tablet Take 1 mg by mouth 3 (three) times daily as needed.  For anxiety   Yes Historical Provider, MD  DULoxetine (CYMBALTA) 60 MG capsule Take 60 mg by mouth daily.   Yes Historical Provider, MD  fluticasone-salmeterol (ADVAIR HFA) 115-21 MCG/ACT inhaler Inhale 2 puffs into the lungs 2 (two) times daily.     Yes Historical Provider, MD  furosemide (LASIX) 20 MG tablet Take 20 mg by mouth every morning.    Yes Historical Provider, MD  HYDROcodone-acetaminophen (NORCO/VICODIN) 5-325 MG per tablet Take 1 tablet by mouth every 8 (eight) hours as needed for pain. For pain   Yes Historical Provider, MD  levothyroxine (SYNTHROID, LEVOTHROID) 50 MCG tablet Take 50 mcg by mouth daily.     Yes Historical Provider, MD  potassium chloride (KLOR-CON) 10 MEQ CR tablet Take 10 mEq by mouth every morning.    Yes Historical Provider, MD  zafirlukast (ACCOLATE) 20 MG tablet Take 20 mg by mouth 2 (two) times daily.   Yes Historical Provider, MD   Allergies  Allergen Reactions  . Morphine And Related Other (See Comments)    Unknown reaction per family  . Penicillins Other (See Comments)    Unknown reaction per family  . Sulfa Antibiotics Other (See Comments)    Unknown reaction per family    FAMILY HISTORY:  Family History  Problem Relation Age of Onset  . Depression Sister    SOCIAL HISTORY:  reports that she quit smoking about 31 years ago. Her smoking use included Cigarettes. She smoked  1.00 pack per day. She has never used smokeless tobacco. She reports that she drinks about 1.5 ounces of alcohol per week. She reports that she does not use illicit drugs.  REVIEW OF SYSTEMS:  Denies pain. Admits that she was trying to hurt herself, continues to have suicidal ideation.   SUBJECTIVE:  Feels tired, denies pain  VITAL SIGNS: Temp:  [97.7 F (36.5 C)-98.3 F (36.8 C)] 98.2 F (36.8 C) (03/07 0700) Pulse Rate:  [76-122] 81 (03/07 0325) Resp:  [8-13] 13 (03/07 0325) BP: (96-136)/(51-87) 125/60 mmHg (03/07 0800) SpO2:  [94 %-100 %] 94 % (03/07 0325) Weight:   [88 kg (194 lb 0.1 oz)] 88 kg (194 lb 0.1 oz) (03/06 2300) HEMODYNAMICS:   VENTILATOR SETTINGS:   INTAKE / OUTPUT: Intake/Output     03/06 0701 - 03/07 0700 03/07 0701 - 03/08 0700   I.V. (mL/kg) 100 (1.1)    Total Intake(mL/kg) 100 (1.1)    Urine (mL/kg/hr) 800    Total Output 800     Net -700            PHYSICAL EXAMINATION: General:  Obese woman, comfortable Neuro:  Awake, slightly lethargic, moves all ext HEENT:  OP moist, ruddy faces, large neck, no stridor Cardiovascular:  Regu,ar no M Lungs:  Decreased at bases, no wheeze or crackles Abdomen:  Obese, soft, benign Musculoskeletal:  No edema Skin:  No rash  LABS:  Recent Labs Lab 02/01/13 1947 02/02/13 0159 02/02/13 0208 02/02/13 0550  HGB 15.0  --   --  12.8  WBC 18.7*  --   --  11.0*  PLT 348  --   --  233  NA 137  --   --  140  K 4.2  --   --  3.0*  CL 98  --   --  103  CO2 20  --   --  23  GLUCOSE 108*  --   --  86  BUN 26*  --   --  25*  CREATININE 0.87  --   --  0.62  CALCIUM 9.5  --   --  8.8  AST 308*  --   --  499*  ALT 247*  --   --  403*  ALKPHOS 126*  --   --  96  BILITOT 0.8  --   --  0.9  PROT 8.3  --   --  6.7  ALBUMIN 4.1  --   --  3.2*  INR 1.17  --   --  1.32  TROPONINI <0.30 <0.30  --   --   PHART  --   --  7.331*  --   PCO2ART  --   --  42.3  --   PO2ART  --   --  69.0*  --    Tylenol 4h >> 273 Tylenol 13h >> 94  MELD 3/7 am >> 10   Recent Labs Lab 02/02/13 0658  GLUCAP 88    CXR: 3/6 >> cardiomegaly, small volumes, significant atx vs infiltrate B LL, L > R  ASSESSMENT / PLAN:  PULMONARY A:B basilar atx vs infiltrate ? Hx asthma P:   - repeat CXR now that she is more awake to eval for clearance - consider abx, although no compelling hx for PNA she was at risk aspiration (received initial dose levaquin on presentation) - neb Bd's ordered - both advair and symbicort on her home med list??   CARDIOVASCULAR A: hemodynamically stable P:   RENAL A:  High risk  acute renal failure if evolves hepatic failure P:   - follow S Cr and lytes - hold home lasix  GASTROINTESTINAL A:  Tylenol OD, intentional. Initial MELD 10. n-AC started 3/7 at midnight P:   - continue n-AC IV for a minimum of 20 hours. Would not stop until serum acetaminophen concentration is undetectable, the ALT is clearly decreasing or in the normal range, and the INR < 2.0. - follow APAP level, LFT, BMP q6h x 4 and then adjust schedule depending on the trend.  - recheck ammonia and trend - High risk for hepatic failure. If MELD score rises 3/7 then strongly consider move to ICU, CVC placement in anticipation of need for more aggressive care (before coagulopathy prohibits line placement). If she shows signs hepatic failure would transfer to a Transplant Center   HEMATOLOGIC A:  leukocytosis P:  - follow CBC and clinical status - repeat CXR, consider abx   INFECTIOUS A:  At risk aspiration P:   - repeat CXR now  ENDOCRINE A:  hypothyroidism   P:   - Start synthroid IV until we are sure she is safe for PO's  NEUROLOGIC A: lethargy, multifactorial due to meds. At high risk for hepatic encephalopathy P:   - follow neuro status closely - hold home abilify, clonazepam, cymbalta - will need psych consult, would call them now  TODAY'S SUMMARY:  I have personally obtained a history, examined the patient, evaluated laboratory and imaging results, formulated the assessment and plan and placed orders. CRITICAL CARE: The patient is critically ill with multiple organ systems failure and requires high complexity decision making for assessment and support, frequent evaluation and titration of therapies, application of advanced monitoring technologies and extensive interpretation of multiple databases. Critical Care Time devoted to patient care services described in this note is 60 minutes.    Levy Pupa, MD, PhD 02/02/2013, 9:49 AM [facility] [phone] or  if no answer [phone]

## 2013-02-02 NOTE — Progress Notes (Signed)
[facility] Progress Note [place] TEAM 1 - Stepdown/ICU TEAM   Elaine Byrd ZOX:[id number] DOB: 12/23/1945 DOA: 02/01/2013 PCP: Ron Parker, MD  Brief narrative: 67 y.o. female with history of hypothyroidism asthma and depression who was brought to the ER after being found on the floor lethargic. As per patient's son and daughter who provided the history they had attempted to contact the patient and when the patient was not answering her phone they had called the police. When the police first went to the house the pt was stating that she was fine stating she had taken some NyQuil and was drowsy. The following day they tried to reach her over the phone again - when there was no answer her son went directly to check on her at her house when she was found to be on the floor lethargic. Her Vicodin bottles and Klonopin bottles were empty - both were refilled a week previously. In the ER patient was found to have elevated LFTs normal INR and elevated Tylenol level. Patient was admitted for Tylenol overdose with suicidal ideation.   Assessment/Plan:  Apparent intentional tylenol OD Remains on mucomyst IV - plan to continue for 20hrs minimal, and not stop until ALT is improved markedly, APAP is undetectable, and INR normalized - initial level 273 ~4hrs post ingestion - as per PCCM "High risk for hepatic failure. If MELD score rises 3/7 then strongly consider move to ICU, CVC placement in anticipation of need for more aggressive care (before coagulopathy prohibits line placement). If she shows signs hepatic failure would transfer to a Transplant Center." - at present I am quite concerned that this patient could progress to fulminant hepatic failure - we will continue to trend her LFTs, INR, creatinine, electrolytes, hemoglobin, and mental status very closely - there is a high likelihood she will require transfer to a transplant center should she fail to show significant signs of improvement over  the next 24 hours  Apparent intentional narcotic and benzo OD Pt is now alert and conversant, though mildly confused - clinically she appears to have survived her narcotic and benzo overdose without lasting effect  Suicidal Ideation w/ hx of depression Pt freely admits to me that she was trying to kill herself - Psychiatry has been consulted - suicide precautions and sitter until otherwise stated by Psych MD  Bibasilar infiltrates Was tx w/ a single dose of levaquin - high risk for aspiration given HPI - recheck CXR suggest no lasting infiltrate and clinically the patient is stable - I will not continue antibiotics at this time  Hypokalemia Replace and follow   Hypothyroidism Continue Synthroid therapy  Hx of EtOH abuse - now moderate intake  Patient has a history of heavy alcohol intake the family has reported that she now limits herself to approximately 2 drinks per week  Code Status: FULL  Family Communication: No family present Disposition Plan: SDU  Consultants: Psychiatry Critical care medicine  Procedures: none  Antibiotics: Levaquin 3/6   DVT prophylaxis: SCDs   HPI/Subjective: Pt is awake and conversant, though mildly confused.  She is oriented and able to provide a hx.  She is requesting pain meds for her "non-diabetic neuropathy."  She denies sob, chest pain, nausea, or vomiting.  She admits to attempting to kill herself with an intentional overdose saying "I took half of a really big bottle of Tylenol because I just wanted to die."  Objective: Blood pressure 125/60, pulse 81, temperature 98.2 F (36.8 C), temperature source Oral,  resp. rate 13, weight 88 kg (194 lb 0.1 oz), SpO2 95.00%.  Intake/Output Summary (Last 24 hours) at 02/02/13 1113 Last data filed at 02/02/13 1000  Gross per 24 hour  Intake 1185.97 ml  Output    800 ml  Net 385.97 ml   Exam: General: No acute respiratory distress Lungs: Clear to auscultation bilaterally without wheezes or  crackles Cardiovascular: Regular rate and rhythm without murmur gallop or rub normal S1 and S2 Abdomen: Nontender, nondistended, soft, bowel sounds positive, no rebound, no ascites, no appreciable mass Extremities: No significant cyanosis, clubbing, or edema bilateral lower extremities  Data Reviewed: Basic Metabolic Panel:  Recent Labs Lab 02/01/13 1947 02/02/13 0550  NA 137 140  K 4.2 3.0*  CL 98 103  CO2 20 23  GLUCOSE 108* 86  BUN 26* 25*  CREATININE 0.87 0.62  CALCIUM 9.5 8.8   Liver Function Tests:  Recent Labs Lab 02/01/13 1947 02/02/13 0550  AST 308* 499*  ALT 247* 403*  ALKPHOS 126* 96  BILITOT 0.8 0.9  PROT 8.3 6.7  ALBUMIN 4.1 3.2*    Recent Labs Lab 02/01/13 1946  AMMONIA 36   CBC:  Recent Labs Lab 02/01/13 1947 02/02/13 0550  WBC 18.7* 11.0*  NEUTROABS 17.1*  --   HGB 15.0 12.8  HCT 44.2 38.5  MCV 93.8 92.8  PLT 348 233   Cardiac Enzymes:  Recent Labs Lab 02/01/13 1947 02/02/13 0159  CKTOTAL 206*  --   TROPONINI <0.30 <0.30   CBG:  Recent Labs Lab 02/02/13 0658  GLUCAP 88    Recent Results (from the past 240 hour(s))  MRSA PCR SCREENING     Status: None   Collection Time    02/02/13  1:25 AM      Result Value Range Status   MRSA by PCR NEGATIVE  NEGATIVE Final   Comment:            The GeneXpert MRSA Assay (FDA     approved for NASAL specimens     only), is one component of a     comprehensive MRSA colonization     surveillance program. It is not     intended to diagnose MRSA     infection nor to guide or     monitor treatment for     MRSA infections.     Studies:  Recent x-ray studies have been reviewed in detail by the Attending Physician  Scheduled Meds:  Scheduled Meds: . acetylcysteine  150 mg/kg Intravenous Once  . folic acid  1 mg Intravenous Daily  . levothyroxine  25 mcg Intravenous Daily  . sodium chloride  3 mL Intravenous Q12H  . thiamine  100 mg Intravenous Daily   Continuous Infusions: .  sodium chloride 100 mL/hr at 02/02/13 0600  . acetylcysteine 15 mg/kg/hr (02/02/13 0600)    Time spent on care of this patient: 92   [facility] T  [facility] Office  [phone] Pager - Text Page per Loretha Stapler as per below:  On-Call/Text Page:      [email or web address]      password [email or web address]  If 7PM-7AM, please contact night-coverage [email or web address] Password [email or web address] 02/02/2013, 11:13 AM   LOS: 1 day

## 2013-02-02 NOTE — Progress Notes (Signed)
Utilization Review Completed.   Kimberly Tucker, RN, BSN Nurse Case Manager  [phone]  

## 2013-02-03 ENCOUNTER — Inpatient Hospital Stay (HOSPITAL_COMMUNITY): Payer: Medicaid Other

## 2013-02-03 DIAGNOSIS — Z5189 Encounter for other specified aftercare: Secondary | ICD-10-CM

## 2013-02-03 LAB — BASIC METABOLIC PANEL
BUN: 12 mg/dL (ref 6–23)
BUN: 6 mg/dL (ref 6–23)
CO2: 19 mEq/L (ref 19–32)
Calcium: 9.1 mg/dL (ref 8.4–10.5)
Calcium: 8.9 mg/dL (ref 8.4–10.5)
Calcium: 8.9 mg/dL (ref 8.4–10.5)
Chloride: 109 mEq/L (ref 96–112)
Chloride: 109 mEq/L (ref 96–112)
Chloride: 110 mEq/L (ref 96–112)
Creatinine, Ser: 0.52 mg/dL (ref 0.50–1.10)
GFR calc Af Amer: >90 mL/min (ref >90)
GFR calc Af Amer: >90 mL/min (ref >90)
GFR calc Af Amer: >90 mL/min (ref >90)
GFR calc Af Amer: >90 mL/min (ref >90)
GFR calc non Af Amer: >90 mL/min (ref >90)
GFR calc non Af Amer: >90 mL/min (ref >90)
GFR calc non Af Amer: >90 mL/min (ref >90)
GFR calc non Af Amer: >90 mL/min (ref >90)
Glucose, Bld: 100 mg/dL — ABNORMAL HIGH (ref 70–99)
Potassium: 3.5 mEq/L (ref 3.5–5.1)
Potassium: 4.2 mEq/L (ref 3.5–5.1)
Sodium: 140 mEq/L (ref 135–145)
Sodium: 141 mEq/L (ref 135–145)
Sodium: 141 mEq/L (ref 135–145)

## 2013-02-03 LAB — HEPATIC FUNCTION PANEL
ALT: 2195 U/L — ABNORMAL HIGH (ref 0–35)
AST: 2027 U/L — ABNORMAL HIGH (ref 0–37)
AST: 2918 U/L — ABNORMAL HIGH (ref 0–37)
AST: 787 U/L — ABNORMAL HIGH (ref 0–37)
Albumin: 2.8 g/dL — ABNORMAL LOW (ref 3.5–5.2)
Albumin: 2.9 g/dL — ABNORMAL LOW (ref 3.5–5.2)
Bilirubin, Direct: 0.1 mg/dL (ref 0.0–0.3)
Bilirubin, Direct: 0.2 mg/dL (ref 0.0–0.3)
Bilirubin, Direct: 0.2 mg/dL (ref 0.0–0.3)
Bilirubin, Direct: 0.2 mg/dL (ref 0.0–0.3)
Indirect Bilirubin: 0.4 mg/dL (ref 0.3–0.9)
Indirect Bilirubin: 0.4 mg/dL (ref 0.3–0.9)
Total Bilirubin: 0.4 mg/dL (ref 0.3–1.2)
Total Bilirubin: 0.5 mg/dL (ref 0.3–1.2)
Total Protein: 6 g/dL (ref 6.0–8.3)

## 2013-02-03 LAB — GLUCOSE, CAPILLARY
Glucose-Capillary: 103 mg/dL — ABNORMAL HIGH (ref 70–99)
Glucose-Capillary: 107 mg/dL — ABNORMAL HIGH (ref 70–99)
Glucose-Capillary: 153 mg/dL — ABNORMAL HIGH (ref 70–99)

## 2013-02-03 LAB — CBC
Hemoglobin: 12.8 g/dL (ref 12.0–15.0)
MCV: 91.7 fL (ref 78.0–100.0)
Platelets: 199 10*3/uL (ref 150–400)
RBC: 4.12 MIL/uL (ref 3.87–5.11)
WBC: 8.4 10*3/uL (ref 4.0–10.5)

## 2013-02-03 LAB — TROPONIN I: Troponin I: <0.3 ng/mL (ref <0.30)

## 2013-02-03 LAB — PROTIME-INR
INR: 1.63 — ABNORMAL HIGH (ref 0.00–1.49)
Prothrombin Time: 17.5 seconds — ABNORMAL HIGH (ref 11.6–15.2)

## 2013-02-03 LAB — URINE CULTURE: Culture: NO GROWTH

## 2013-02-03 LAB — PHOSPHORUS
Phosphorus: 0.6 mg/dL — CL (ref 2.3–4.6)
Phosphorus: 2.3 mg/dL (ref 2.3–4.6)

## 2013-02-03 LAB — MAGNESIUM
Magnesium: 1.7 mg/dL (ref 1.5–2.5)
Magnesium: 1.6 mg/dL (ref 1.5–2.5)

## 2013-02-03 LAB — ACETAMINOPHEN LEVEL: Acetaminophen (Tylenol), Serum: <15 ug/mL (ref 10–30)

## 2013-02-03 MED ORDER — LEVOTHYROXINE SODIUM 50 MCG PO TABS
50.0000 ug | ORAL_TABLET | Freq: Every day | ORAL | Status: DC
  Administered 2013-02-03 – 2013-02-06 (×3): 50 ug via ORAL
  Filled 2013-02-03 (×4): qty 1

## 2013-02-03 MED ORDER — DEXTROSE 5 % IV SOLN
15.0000 mg/kg/h | INTRAVENOUS | Status: AC
  Filled 2013-02-03: qty 200

## 2013-02-03 MED ORDER — FOLIC ACID 1 MG PO TABS
1.0000 mg | ORAL_TABLET | Freq: Every day | ORAL | Status: DC
  Administered 2013-02-03 – 2013-02-06 (×4): 1 mg via ORAL
  Filled 2013-02-03 (×4): qty 1

## 2013-02-03 MED ORDER — SODIUM CHLORIDE 0.9 % IJ SOLN
INTRAMUSCULAR | Status: AC
  Administered 2013-02-03: 10 mL
  Filled 2013-02-03: qty 10

## 2013-02-03 MED ORDER — SODIUM CHLORIDE 0.9 % IV SOLN
INTRAVENOUS | Status: DC
  Administered 2013-02-03: 75 mL/h via INTRAVENOUS
  Administered 2013-02-03: 50 mL/h via INTRAVENOUS
  Administered 2013-02-04: 12:00:00 via INTRAVENOUS

## 2013-02-03 MED ORDER — DEXTROSE 5 % IV SOLN
15.0000 mg/kg/h | INTRAVENOUS | Status: DC
  Administered 2013-02-03 (×3): 15 mg/kg/h via INTRAVENOUS
  Filled 2013-02-03 (×8): qty 50

## 2013-02-03 MED ORDER — LEVOTHYROXINE SODIUM 50 MCG PO TABS
50.0000 ug | ORAL_TABLET | Freq: Every day | ORAL | Status: DC
  Filled 2013-02-03: qty 1

## 2013-02-03 MED ORDER — POTASSIUM PHOSPHATE DIBASIC 3 MMOLE/ML IV SOLN
30.0000 mmol | Freq: Once | INTRAVENOUS | Status: AC
  Administered 2013-02-03: 30 mmol via INTRAVENOUS
  Filled 2013-02-03: qty 10

## 2013-02-03 MED ORDER — NAPHAZOLINE HCL 0.1 % OP SOLN
1.0000 [drp] | Freq: Four times a day (QID) | OPHTHALMIC | Status: DC | PRN
  Administered 2013-02-03: 1 [drp] via OPHTHALMIC
  Filled 2013-02-03: qty 15

## 2013-02-03 MED ORDER — VITAMIN B-1 100 MG PO TABS
100.0000 mg | ORAL_TABLET | Freq: Every day | ORAL | Status: DC
  Administered 2013-02-03 – 2013-02-06 (×4): 100 mg via ORAL
  Filled 2013-02-03 (×4): qty 1

## 2013-02-03 NOTE — Progress Notes (Signed)
Patty with Poison Control called to confirm lab values and the [facility] gtt was still running. She endorsed that gtt should remain on for at least 24 more hours. Will pass on to day shift RN.

## 2013-02-03 NOTE — Progress Notes (Signed)
PULMONARY  / CRITICAL CARE MEDICINE  Name: CHRISTYANN MANOLIS MRN: [id number] DOB: [birth year]    ADMISSION DATE:  02/01/2013 CONSULTATION DATE:  02/02/13   REFERRING MD :  Sharyne Peach  CHIEF COMPLAINT:  Tylenol OD  BRIEF PATIENT DESCRIPTION: 67,  hx depression, hypothyroid, ? Asthma. Admitted 3/6 pm after intentional ingestion benzos, narcs, tylenol (level=272). Started n-AC approx 23:30 3/6. PCCM consulted 3/7.   SIGNIFICANT EVENTS / STUDIES:  MELD 3/7 am >> 10  LINES / TUBES: none  CULTURES: Urine 3/6 >> Not collected  ANTIBIOTICS: levaquin 3/6 >> single dose   HISTORY OF PRESENT ILLNESS:  67 yo former tobacco former tobacco, current moderate EtOH, hx depression, ? Asthma. Found down by family 3/6 pm, poorly responsive, lethargic. They noted empty vicodin, clonazepam bottles that had been filled x 1 week. Initial tylenol level 273 (at ~4h time point), MELD 10. More oriented 3/7 am and able to tell me that she took vicodin, benzos and a "large part" of a large, regular strength tylenol bottle. # pills unclear. IV n-AC was started approx midnight 02/02/13.   PAST MEDICAL HISTORY :  Past Medical History  Diagnosis Date  . Depression   . Asthma   . Hypothyroidism    Past Surgical History  Procedure Laterality Date  . Brain surgery    . Neck surgery    . Abdominal hysterectomy    . Cholecystectomy     Prior to Admission medications   Medication Sig Start Date End Date Taking? Authorizing Katelyn Broadnax  albuterol (PROVENTIL HFA;VENTOLIN HFA) 108 (90 BASE) MCG/ACT inhaler Inhale 2 puffs into the lungs every 6 (six) hours as needed for wheezing. For wheezing   Yes Historical Florentina Marquart, MD  ARIPiprazole (ABILIFY) 2 MG tablet Take 2 mg by mouth at bedtime.    Yes Historical Shawntina Diffee, MD  budesonide-formoterol (SYMBICORT) 160-4.5 MCG/ACT inhaler Inhale 2 puffs into the lungs 2 (two) times daily.   Yes Historical Teegan Brandis, MD  clonazePAM (KLONOPIN) 1 MG tablet Take 1 mg by mouth 3 (three) times daily as  needed. For anxiety   Yes Historical Orie Cuttino, MD  DULoxetine (CYMBALTA) 60 MG capsule Take 60 mg by mouth daily.   Yes Historical Calab Sachse, MD  fluticasone-salmeterol (ADVAIR HFA) 115-21 MCG/ACT inhaler Inhale 2 puffs into the lungs 2 (two) times daily.     Yes Historical Georgine Wiltse, MD  furosemide (LASIX) 20 MG tablet Take 20 mg by mouth every morning.    Yes Historical Shia Eber, MD  HYDROcodone-acetaminophen (NORCO/VICODIN) 5-325 MG per tablet Take 1 tablet by mouth every 8 (eight) hours as needed for pain. For pain   Yes Historical Cinzia Devos, MD  levothyroxine (SYNTHROID, LEVOTHROID) 50 MCG tablet Take 50 mcg by mouth daily.     Yes Historical Bascom Biel, MD  potassium chloride (KLOR-CON) 10 MEQ CR tablet Take 10 mEq by mouth every morning.    Yes Historical Arber Wiemers, MD  zafirlukast (ACCOLATE) 20 MG tablet Take 20 mg by mouth 2 (two) times daily.   Yes Historical Brendolyn Stockley, MD   Allergies  Allergen Reactions  . Morphine And Related Other (See Comments)    Unknown reaction per family  . Penicillins Other (See Comments)    Unknown reaction per family  . Sulfa Antibiotics Other (See Comments)    Unknown reaction per family    SUBJECTIVE:  Feels tired, denies pain  VITAL SIGNS: Temp:  [97.8 F (36.6 C)-98.4 F (36.9 C)] 98.4 F (36.9 C) (03/08 0800) Pulse Rate:  [77-134] 97 (03/08 0830) Resp:  [11-20] 17 (03/08  0830) BP: (112-149)/(60-88) 132/62 mmHg (03/08 0830) SpO2:  [96 %-100 %] 98 % (03/08 0830) Weight:  [93.5 kg (206 lb 2.1 oz)] 93.5 kg (206 lb 2.1 oz) (03/08 0350) HEMODYNAMICS:   VENTILATOR SETTINGS:   INTAKE / OUTPUT: Intake/Output     03/07 0701 - 03/08 0700 03/08 0701 - 03/09 0700   P.O. 480    I.V. (mL/kg) 1854 (19.8) 150 (1.6)   Total Intake(mL/kg) 2334 (25) 150 (1.6)   Urine (mL/kg/hr)     Total Output       Net +2334 +150        Urine Occurrence 4 x    Stool Occurrence 2 x      PHYSICAL EXAMINATION: General:  Obese woman, comfortable Neuro:  Awake,  slightly lethargic, moves all ext HEENT:  OP moist, ruddy faces, large neck, no stridor Cardiovascular:  Regu,ar no M Lungs:  Decreased at bases, no wheeze or crackles Abdomen:  Obese, soft, benign Musculoskeletal:  No edema Skin:  No rash  LABS:  Recent Labs Lab 02/01/13 1947 02/02/13 0159 02/02/13 0208 02/02/13 0550 02/02/13 1158 02/02/13 1754 02/02/13 2337 02/03/13 0650  HGB 15.0  --   --  12.8  --   --   --  12.8  WBC 18.7*  --   --  11.0*  --   --   --  8.4  PLT 348  --   --  233  --   --   --  199  NA 137  --   --  140 141 140 140 141  K 4.2  --   --  3.0* 3.2* 4.0 3.5 3.7  CL 98  --   --  103 106 105 109 109  CO2 20  --   --  23 21 22 23 19   GLUCOSE 108*  --   --  86 99 90 99 100*  BUN 26*  --   --  25* 21 18 12 8   CREATININE 0.87  --   --  0.62 0.54 0.56 0.59 0.50  CALCIUM 9.5  --   --  8.8 9.0 8.8 8.9 9.1  MG  --   --   --   --   --  2.1  --  1.7  PHOS  --   --   --   --   --  1.0*  --  0.6*  AST 308*  --   --  499* 1074* 2254* 2918* 2027*  ALT 247*  --   --  403* 823* 1603* 2195* 2028*  ALKPHOS 126*  --   --  96 90 92 91 94  BILITOT 0.8  --   --  0.9 0.8 0.5 0.4 0.6  PROT 8.3  --   --  6.7 6.3 6.5 6.5 6.4  ALBUMIN 4.1  --   --  3.2* 2.9* 3.0* 2.9* 2.9*  INR 1.17  --   --  1.32 1.41  --  1.63*  --   TROPONINI <0.30 <0.30  --   --   --   --   --  <0.30  PHART  --   --  7.331*  --   --   --   --   --   PCO2ART  --   --  42.3  --   --   --   --   --   PO2ART  --   --  69.0*  --   --   --   --   --  Tylenol 4h >> 273 Tylenol 13h >> 94  MELD 3/7 am >> 10   Recent Labs Lab 02/02/13 0658 02/02/13 1123 02/02/13 1547 02/02/13 2339 02/03/13 0640  GLUCAP 88 93 104* 103* 85    CXR: 3/6>> cardiomegaly, small volumes, significant atx vs infiltrate B LL, L > R CXR 3/8>> Findings: Mild elevation of the right hemidiaphragm is again seen. There is some coarsening of the pulmonary interstitium. No consolidative process, pneumothorax or effusion is identified.  Cardiomegaly is noted.  IMPRESSION:  No acute abnormality.   ASSESSMENT / PLAN:  PULMONARY A:B basilar atx vs infiltrate ? Hx asthma P:   - repeat CXR now that she is more awake to eval for clearance=> no infiltrate... - consider abx, although no compelling hx for PNA she was at risk aspiration (received initial dose levaquin on presentation) - neb Bd's ordered - both advair and symbicort on her home med list??   CARDIOVASCULAR A: hemodynamically stable P:  monitor  RENAL A:  High risk acute renal failure if evolves hepatic failure P:   - follow S Cr and lytes - hold home lasix  GASTROINTESTINAL A:  Tylenol OD, intentional. Initial MELD 10. n-AC started 3/7 at midnight P:   - continue n-AC IV for a minimum of 20 hours. Would not stop until serum acetaminophen concentration is undetectable, the ALT is clearly decreasing or in the normal range, and the INR < 2.0. - follow APAP level, LFT, BMP q6h x 4 and then adjust schedule depending on the trend.  - recheck ammonia and trend - High risk for hepatic failure. If MELD score rises 3/7 then strongly consider move to ICU, CVC placement in anticipation of need for more aggressive care (before coagulopathy prohibits line placement). If she shows signs hepatic failure would transfer to a Transplant Center  HEMATOLOGIC A:  leukocytosis P:  - follow CBC and clinical status - repeat CXR, consider abx   INFECTIOUS A:  At risk aspiration P:   - repeat CXR w/o infiltrate  ENDOCRINE A:  hypothyroidism   P:   - Start Synthroid IV until we are sure she is safe for PO's  NEUROLOGIC A: lethargy, multifactorial due to meds. At high risk for hepatic encephalopathy P:   - follow neuro status closely - hold home abilify, clonazepam, cymbalta - will need psych consult, would call them now   [facility]. Kriste Basque, MD  02/03/2013, 9:39 AM [facility]

## 2013-02-03 NOTE — Consult Note (Signed)
Reason for Consult: OD, SI Referring Physician: unknown  Elaine Byrd is an 67 y.o. female.  HPI: Elaine Byrd is a 67 y.o. female with history of hypothyroidism asthma and depression was brought to the ER after patient was found on the floor lethargic. Per chart they had contacted the patient last night and once patient was not answering her phone they had called the police. When the police had gone to the house she was stating that she was fine and at that point she had talked to her daughter stating that she had taken some NyQuil and was drowsy. Later they tried to reach her over the phone when there was no answer her son went directly to check on her at her house when she was found to be on the floor lethargic. Her pain medication Vicodin bottles and Klonopin bottles were empty which was just refilled a week ago. In the ER patient was found to have elevated LFTs normal INR and elevated Tylenol level. Patient at this time has been admitted for Tylenol overdose with suicidal ideation.    Seen in ICU. Very sleepy and not able to give much. Reported SI ideation before coming. Reported being week at this time. Reports a lot of pain in recent times. Per pt her situation is very difficult but she was not able to explain much at this time due to her current mental status.   Past Medical History  Diagnosis Date  . Depression   . Asthma   . Hypothyroidism     Past Surgical History  Procedure Laterality Date  . Brain surgery    . Neck surgery    . Abdominal hysterectomy    . Cholecystectomy      Family History  Problem Relation Age of Onset  . Depression Sister     Social History:  reports that she quit smoking about 31 years ago. Her smoking use included Cigarettes. She smoked 1.00 pack per day. She has never used smokeless tobacco. She reports that she drinks about 1.5 ounces of alcohol per week. She reports that she does not use illicit drugs.  Allergies:  Allergies  Allergen Reactions   . Morphine And Related Other (See Comments)    Unknown reaction per family  . Penicillins Other (See Comments)    Unknown reaction per family  . Sulfa Antibiotics Other (See Comments)    Unknown reaction per family    Medications: I have reviewed the patient's current medications.  Results for orders placed during the hospital encounter of 02/01/13 (from the past 48 hour(s))  AMMONIA     Status: None   Collection Time    02/01/13  7:46 PM      Result Value Range   Ammonia 36  11 - 60 umol/L  COMPREHENSIVE METABOLIC PANEL     Status: Abnormal   Collection Time    02/01/13  7:47 PM      Result Value Range   Sodium 137  135 - 145 mEq/L   Potassium 4.2  3.5 - 5.1 mEq/L   Chloride 98  96 - 112 mEq/L   CO2 20  19 - 32 mEq/L   Glucose, Bld 108 (*) 70 - 99 mg/dL   BUN 26 (*) 6 - 23 mg/dL   Creatinine, Ser 1.61  0.50 - 1.10 mg/dL   Calcium 9.5  8.4 - 09.6 mg/dL   Total Protein 8.3  6.0 - 8.3 g/dL   Albumin 4.1  3.5 - 5.2 g/dL  AST 308 (*) 0 - 37 U/L   ALT 247 (*) 0 - 35 U/L   Alkaline Phosphatase 126 (*) 39 - 117 U/L   Total Bilirubin 0.8  0.3 - 1.2 mg/dL   GFR calc non Af Amer 69 (*) >90 mL/min   GFR calc Af Amer 80 (*) >90 mL/min   Comment:            The eGFR has been calculated     using the CKD EPI equation.     This calculation has not been     validated in all clinical     situations.     eGFR's persistently     <90 mL/min signify     possible Chronic Kidney Disease.  CBC WITH DIFFERENTIAL     Status: Abnormal   Collection Time    02/01/13  7:47 PM      Result Value Range   WBC 18.7 (*) 4.0 - 10.5 K/uL   RBC 4.71  3.87 - 5.11 MIL/uL   Hemoglobin 15.0  12.0 - 15.0 g/dL   HCT 40.9  81.1 - 91.4 %   MCV 93.8  78.0 - 100.0 fL   MCH 31.8  26.0 - 34.0 pg   MCHC 33.9  30.0 - 36.0 g/dL   RDW 78.2  95.6 - 21.3 %   Platelets 348  150 - 400 K/uL   Neutrophils Relative 91 (*) 43 - 77 %   Neutro Abs 17.1 (*) 1.7 - 7.7 K/uL   Lymphocytes Relative 6 (*) 12 - 46 %    Lymphs Abs 1.1  0.7 - 4.0 K/uL   Monocytes Relative 2 (*) 3 - 12 %   Monocytes Absolute 0.4  0.1 - 1.0 K/uL   Eosinophils Relative 0  0 - 5 %   Eosinophils Absolute 0.0  0.0 - 0.7 K/uL   Basophils Relative 0  0 - 1 %   Basophils Absolute 0.0  0.0 - 0.1 K/uL  TROPONIN I     Status: None   Collection Time    02/01/13  7:47 PM      Result Value Range   Troponin I <0.30  <0.30 ng/mL   Comment:            Due to the release kinetics of cTnI,     a negative result within the first hours     of the onset of symptoms does not rule out     myocardial infarction with certainty.     If myocardial infarction is still suspected,     repeat the test at appropriate intervals.  PROTIME-INR     Status: None   Collection Time    02/01/13  7:47 PM      Result Value Range   Prothrombin Time 14.7  11.6 - 15.2 seconds   INR 1.17  0.00 - 1.49  ETHANOL     Status: None   Collection Time    02/01/13  7:47 PM      Result Value Range   Alcohol, Ethyl (B) <11  0 - 11 mg/dL   Comment:            LOWEST DETECTABLE LIMIT FOR     SERUM ALCOHOL IS 11 mg/dL     FOR MEDICAL PURPOSES ONLY  CK     Status: Abnormal   Collection Time    02/01/13  7:47 PM      Result Value Range   Total CK 206 (*) 7 -  177 U/L  URINALYSIS, ROUTINE W REFLEX MICROSCOPIC     Status: Abnormal   Collection Time    02/01/13  8:02 PM      Result Value Range   Color, Urine AMBER (*) YELLOW   Comment: BIOCHEMICALS MAY BE AFFECTED BY COLOR   APPearance CLEAR  CLEAR   Specific Gravity, Urine >1.046 (*) 1.005 - 1.030   Comment: REPEATED TO VERIFY   pH 5.0  5.0 - 8.0   Glucose, UA NEGATIVE  NEGATIVE mg/dL   Hgb urine dipstick NEGATIVE  NEGATIVE   Bilirubin Urine SMALL (*) NEGATIVE   Ketones, ur NEGATIVE  NEGATIVE mg/dL   Protein, ur NEGATIVE  NEGATIVE mg/dL   Urobilinogen, UA 0.2  0.0 - 1.0 mg/dL   Nitrite NEGATIVE  NEGATIVE   Leukocytes, UA SMALL (*) NEGATIVE  URINE RAPID DRUG SCREEN (HOSP PERFORMED)     Status: Abnormal    Collection Time    02/01/13  8:02 PM      Result Value Range   Opiates POSITIVE (*) NONE DETECTED   Cocaine NONE DETECTED  NONE DETECTED   Benzodiazepines POSITIVE (*) NONE DETECTED   Amphetamines NONE DETECTED  NONE DETECTED   Tetrahydrocannabinol NONE DETECTED  NONE DETECTED   Barbiturates NONE DETECTED  NONE DETECTED   Comment:            DRUG SCREEN FOR MEDICAL PURPOSES     ONLY.  IF CONFIRMATION IS NEEDED     FOR ANY PURPOSE, NOTIFY LAB     WITHIN 5 DAYS.                LOWEST DETECTABLE LIMITS     FOR URINE DRUG SCREEN     Drug Class       Cutoff (ng/mL)     Amphetamine      1000     Barbiturate      200     Benzodiazepine   200     Tricyclics       300     Opiates          300     Cocaine          300     THC              50  URINE MICROSCOPIC-ADD ON     Status: Abnormal   Collection Time    02/01/13  8:02 PM      Result Value Range   Squamous Epithelial / LPF FEW (*) RARE   WBC, UA 7-10  <3 WBC/hpf   Bacteria, UA FEW (*) RARE  URINE CULTURE     Status: None   Collection Time    02/01/13  8:02 PM      Result Value Range   Specimen Description URINE, CATHETERIZED     Special Requests NONE     Culture  Setup Time 02/02/2013 04:45     Colony Count NO GROWTH     Culture NO GROWTH     Report Status 02/03/2013 FINAL    ACETAMINOPHEN LEVEL     Status: Abnormal   Collection Time    02/01/13  8:59 PM      Result Value Range   Acetaminophen (Tylenol), Serum 272.6 (*) 10 - 30 ug/mL   Comment:            THERAPEUTIC CONCENTRATIONS VARY     SIGNIFICANTLY. A RANGE OF 10-30     ug/mL MAY BE AN EFFECTIVE  CONCENTRATION FOR MANY PATIENTS.     HOWEVER, SOME ARE BEST TREATED     AT CONCENTRATIONS OUTSIDE THIS     RANGE.     ACETAMINOPHEN CONCENTRATIONS     >150 ug/mL AT 4 HOURS AFTER     INGESTION AND >50 ug/mL AT 12     HOURS AFTER INGESTION ARE     OFTEN ASSOCIATED WITH TOXIC     REACTIONS.     CRITICAL RESULT CALLED TO, READ BACK BY AND VERIFIED WITH:     RN T.  Burnetta Sabin 02/01/13 2205 Sallyanne Kuster M.  SALICYLATE LEVEL     Status: Abnormal   Collection Time    02/01/13  8:59 PM      Result Value Range   Salicylate Lvl <2.0 (*) 2.8 - 20.0 mg/dL  MRSA PCR SCREENING     Status: None   Collection Time    02/02/13  1:25 AM      Result Value Range   MRSA by PCR NEGATIVE  NEGATIVE   Comment:            The GeneXpert MRSA Assay (FDA     approved for NASAL specimens     only), is one component of a     comprehensive MRSA colonization     surveillance program. It is not     intended to diagnose MRSA     infection nor to guide or     monitor treatment for     MRSA infections.  TROPONIN I     Status: None   Collection Time    02/02/13  1:59 AM      Result Value Range   Troponin I <0.30  <0.30 ng/mL   Comment:            Due to the release kinetics of cTnI,     a negative result within the first hours     of the onset of symptoms does not rule out     myocardial infarction with certainty.     If myocardial infarction is still suspected,     repeat the test at appropriate intervals.  BLOOD GAS, ARTERIAL     Status: Abnormal   Collection Time    02/02/13  2:08 AM      Result Value Range   O2 Content 2.0     Delivery systems NASAL CANNULA     pH, Arterial 7.331 (*) 7.350 - 7.450   pCO2 arterial 42.3  35.0 - 45.0 mmHg   pO2, Arterial 69.0 (*) 80.0 - 100.0 mmHg   Bicarbonate 21.7  20.0 - 24.0 mEq/L   TCO2 23.0  0 - 100 mmol/L   Acid-base deficit 3.3 (*) 0.0 - 2.0 mmol/L   O2 Saturation 94.3     Patient temperature 98.6     Collection site RIGHT RADIAL     Drawn by [phone]     Sample type ARTERIAL DRAW     Allens test (pass/fail) PASS  PASS  BASIC METABOLIC PANEL     Status: Abnormal   Collection Time    02/02/13  5:50 AM      Result Value Range   Sodium 140  135 - 145 mEq/L   Potassium 3.0 (*) 3.5 - 5.1 mEq/L   Comment: DELTA CHECK NOTED   Chloride 103  96 - 112 mEq/L   CO2 23  19 - 32 mEq/L   Glucose, Bld 86  70 - 99 mg/dL   BUN 25 (*) 6 -  23  mg/dL   Creatinine, Ser 1.61  0.50 - 1.10 mg/dL   Calcium 8.8  8.4 - 09.6 mg/dL   GFR calc non Af Amer >90  >90 mL/min   GFR calc Af Amer >90  >90 mL/min   Comment:            The eGFR has been calculated     using the CKD EPI equation.     This calculation has not been     validated in all clinical     situations.     eGFR's persistently     <90 mL/min signify     possible Chronic Kidney Disease.  HEPATIC FUNCTION PANEL     Status: Abnormal   Collection Time    02/02/13  5:50 AM      Result Value Range   Total Protein 6.7  6.0 - 8.3 g/dL   Albumin 3.2 (*) 3.5 - 5.2 g/dL   AST 045 (*) 0 - 37 U/L   ALT 403 (*) 0 - 35 U/L   Alkaline Phosphatase 96  39 - 117 U/L   Total Bilirubin 0.9  0.3 - 1.2 mg/dL   Bilirubin, Direct 0.2  0.0 - 0.3 mg/dL   Indirect Bilirubin 0.7  0.3 - 0.9 mg/dL  PROTIME-INR     Status: Abnormal   Collection Time    02/02/13  5:50 AM      Result Value Range   Prothrombin Time 16.1 (*) 11.6 - 15.2 seconds   INR 1.32  0.00 - 1.49  ACETAMINOPHEN LEVEL     Status: Abnormal   Collection Time    02/02/13  5:50 AM      Result Value Range   Acetaminophen (Tylenol), Serum 94.3 (*) 10 - 30 ug/mL   Comment:            THERAPEUTIC CONCENTRATIONS VARY     SIGNIFICANTLY. A RANGE OF 10-30     ug/mL MAY BE AN EFFECTIVE     CONCENTRATION FOR MANY PATIENTS.     HOWEVER, SOME ARE BEST TREATED     AT CONCENTRATIONS OUTSIDE THIS     RANGE.     ACETAMINOPHEN CONCENTRATIONS     >150 ug/mL AT 4 HOURS AFTER     INGESTION AND >50 ug/mL AT 12     HOURS AFTER INGESTION ARE     OFTEN ASSOCIATED WITH TOXIC     REACTIONS.  CBC     Status: Abnormal   Collection Time    02/02/13  5:50 AM      Result Value Range   WBC 11.0 (*) 4.0 - 10.5 K/uL   RBC 4.15  3.87 - 5.11 MIL/uL   Hemoglobin 12.8  12.0 - 15.0 g/dL   HCT 40.9  81.1 - 91.4 %   MCV 92.8  78.0 - 100.0 fL   MCH 30.8  26.0 - 34.0 pg   MCHC 33.2  30.0 - 36.0 g/dL   RDW 78.2  95.6 - 21.3 %   Platelets 233  150 - 400  K/uL   Comment: DELTA CHECK NOTED     REPEATED TO VERIFY  GLUCOSE, CAPILLARY     Status: None   Collection Time    02/02/13  6:58 AM      Result Value Range   Glucose-Capillary 88  70 - 99 mg/dL   Comment 1 Notify RN     Comment 2 Documented in Chart    GLUCOSE, CAPILLARY     Status:  None   Collection Time    02/02/13 11:23 AM      Result Value Range   Glucose-Capillary 93  70 - 99 mg/dL   Comment 1 Notify RN    HEPATIC FUNCTION PANEL     Status: Abnormal   Collection Time    02/02/13 11:58 AM      Result Value Range   Total Protein 6.3  6.0 - 8.3 g/dL   Albumin 2.9 (*) 3.5 - 5.2 g/dL   AST 8657 (*) 0 - 37 U/L   ALT 823 (*) 0 - 35 U/L   Alkaline Phosphatase 90  39 - 117 U/L   Total Bilirubin 0.8  0.3 - 1.2 mg/dL   Bilirubin, Direct 0.2  0.0 - 0.3 mg/dL   Indirect Bilirubin 0.6  0.3 - 0.9 mg/dL  BASIC METABOLIC PANEL     Status: Abnormal   Collection Time    02/02/13 11:58 AM      Result Value Range   Sodium 141  135 - 145 mEq/L   Potassium 3.2 (*) 3.5 - 5.1 mEq/L   Chloride 106  96 - 112 mEq/L   CO2 21  19 - 32 mEq/L   Glucose, Bld 99  70 - 99 mg/dL   BUN 21  6 - 23 mg/dL   Creatinine, Ser 8.46  0.50 - 1.10 mg/dL   Calcium 9.0  8.4 - 96.2 mg/dL   GFR calc non Af Amer >90  >90 mL/min   GFR calc Af Amer >90  >90 mL/min   Comment:            The eGFR has been calculated     using the CKD EPI equation.     This calculation has not been     validated in all clinical     situations.     eGFR's persistently     <90 mL/min signify     possible Chronic Kidney Disease.  PROTIME-INR     Status: Abnormal   Collection Time    02/02/13 11:58 AM      Result Value Range   Prothrombin Time 16.9 (*) 11.6 - 15.2 seconds   INR 1.41  0.00 - 1.49  GLUCOSE, CAPILLARY     Status: Abnormal   Collection Time    02/02/13  3:47 PM      Result Value Range   Glucose-Capillary 104 (*) 70 - 99 mg/dL  ACETAMINOPHEN LEVEL     Status: None   Collection Time    02/02/13  5:52 PM       Result Value Range   Acetaminophen (Tylenol), Serum 29.0  10 - 30 ug/mL   Comment:            THERAPEUTIC CONCENTRATIONS VARY     SIGNIFICANTLY. A RANGE OF 10-30     ug/mL MAY BE AN EFFECTIVE     CONCENTRATION FOR MANY PATIENTS.     HOWEVER, SOME ARE BEST TREATED     AT CONCENTRATIONS OUTSIDE THIS     RANGE.     ACETAMINOPHEN CONCENTRATIONS     >150 ug/mL AT 4 HOURS AFTER     INGESTION AND >50 ug/mL AT 12     HOURS AFTER INGESTION ARE     OFTEN ASSOCIATED WITH TOXIC     REACTIONS.     HEMOLYSIS AT THIS LEVEL MAY AFFECT RESULT  BASIC METABOLIC PANEL     Status: None   Collection Time    02/02/13  5:54 PM  Result Value Range   Sodium 140  135 - 145 mEq/L   Potassium 4.0  3.5 - 5.1 mEq/L   Comment: HEMOLYSIS AT THIS LEVEL MAY AFFECT RESULT   Chloride 105  96 - 112 mEq/L   CO2 22  19 - 32 mEq/L   Glucose, Bld 90  70 - 99 mg/dL   BUN 18  6 - 23 mg/dL   Creatinine, Ser 2.72  0.50 - 1.10 mg/dL   Calcium 8.8  8.4 - 53.6 mg/dL   GFR calc non Af Amer >90  >90 mL/min   GFR calc Af Amer >90  >90 mL/min   Comment:            The eGFR has been calculated     using the CKD EPI equation.     This calculation has not been     validated in all clinical     situations.     eGFR's persistently     <90 mL/min signify     possible Chronic Kidney Disease.  HEPATIC FUNCTION PANEL     Status: Abnormal   Collection Time    02/02/13  5:54 PM      Result Value Range   Total Protein 6.5  6.0 - 8.3 g/dL   Albumin 3.0 (*) 3.5 - 5.2 g/dL   AST 6440 (*) 0 - 37 U/L   ALT 1603 (*) 0 - 35 U/L   Alkaline Phosphatase 92  39 - 117 U/L   Total Bilirubin 0.5  0.3 - 1.2 mg/dL   Bilirubin, Direct 0.1  0.0 - 0.3 mg/dL   Comment: HEMOLYSIS AT THIS LEVEL MAY AFFECT RESULT   Indirect Bilirubin 0.4  0.3 - 0.9 mg/dL  MAGNESIUM     Status: None   Collection Time    02/02/13  5:54 PM      Result Value Range   Magnesium 2.1  1.5 - 2.5 mg/dL  PHOSPHORUS     Status: Abnormal   Collection Time    02/02/13   5:54 PM      Result Value Range   Phosphorus 1.0 (*) 2.3 - 4.6 mg/dL   Comment: CRITICAL RESULT CALLED TO, READ BACK BY AND VERIFIED WITH:     E STANFIELDS,RN 1859 02/02/13 WBOND  BASIC METABOLIC PANEL     Status: None   Collection Time    02/02/13 11:37 PM      Result Value Range   Sodium 140  135 - 145 mEq/L   Potassium 3.5  3.5 - 5.1 mEq/L   Chloride 109  96 - 112 mEq/L   CO2 23  19 - 32 mEq/L   Glucose, Bld 99  70 - 99 mg/dL   BUN 12  6 - 23 mg/dL   Creatinine, Ser 3.47  0.50 - 1.10 mg/dL   Calcium 8.9  8.4 - 42.5 mg/dL   GFR calc non Af Amer >90  >90 mL/min   GFR calc Af Amer >90  >90 mL/min   Comment:            The eGFR has been calculated     using the CKD EPI equation.     This calculation has not been     validated in all clinical     situations.     eGFR's persistently     <90 mL/min signify     possible Chronic Kidney Disease.  HEPATIC FUNCTION PANEL     Status: Abnormal   Collection Time  02/02/13 11:37 PM      Result Value Range   Total Protein 6.5  6.0 - 8.3 g/dL   Albumin 2.9 (*) 3.5 - 5.2 g/dL   AST 4540 (*) 0 - 37 U/L   ALT 2195 (*) 0 - 35 U/L   Alkaline Phosphatase 91  39 - 117 U/L   Total Bilirubin 0.4  0.3 - 1.2 mg/dL   Bilirubin, Direct 0.2  0.0 - 0.3 mg/dL   Indirect Bilirubin 0.2 (*) 0.3 - 0.9 mg/dL  PROTIME-INR     Status: Abnormal   Collection Time    02/02/13 11:37 PM      Result Value Range   Prothrombin Time 18.8 (*) 11.6 - 15.2 seconds   INR 1.63 (*) 0.00 - 1.49  GLUCOSE, CAPILLARY     Status: Abnormal   Collection Time    02/02/13 11:39 PM      Result Value Range   Glucose-Capillary 103 (*) 70 - 99 mg/dL  GLUCOSE, CAPILLARY     Status: None   Collection Time    02/03/13  6:40 AM      Result Value Range   Glucose-Capillary 85  70 - 99 mg/dL  CBC     Status: None   Collection Time    02/03/13  6:50 AM      Result Value Range   WBC 8.4  4.0 - 10.5 K/uL   RBC 4.12  3.87 - 5.11 MIL/uL   Hemoglobin 12.8  12.0 - 15.0 g/dL   HCT  98.1  19.1 - 47.8 %   MCV 91.7  78.0 - 100.0 fL   MCH 31.1  26.0 - 34.0 pg   MCHC 33.9  30.0 - 36.0 g/dL   RDW 29.5  62.1 - 30.8 %   Platelets 199  150 - 400 K/uL  BASIC METABOLIC PANEL     Status: Abnormal   Collection Time    02/03/13  6:50 AM      Result Value Range   Sodium 141  135 - 145 mEq/L   Potassium 3.7  3.5 - 5.1 mEq/L   Chloride 109  96 - 112 mEq/L   CO2 19  19 - 32 mEq/L   Glucose, Bld 100 (*) 70 - 99 mg/dL   BUN 8  6 - 23 mg/dL   Creatinine, Ser 6.57  0.50 - 1.10 mg/dL   Calcium 9.1  8.4 - 84.6 mg/dL   GFR calc non Af Amer >90  >90 mL/min   GFR calc Af Amer >90  >90 mL/min   Comment:            The eGFR has been calculated     using the CKD EPI equation.     This calculation has not been     validated in all clinical     situations.     eGFR's persistently     <90 mL/min signify     possible Chronic Kidney Disease.  HEPATIC FUNCTION PANEL     Status: Abnormal   Collection Time    02/03/13  6:50 AM      Result Value Range   Total Protein 6.4  6.0 - 8.3 g/dL   Albumin 2.9 (*) 3.5 - 5.2 g/dL   AST 9629 (*) 0 - 37 U/L   ALT 2028 (*) 0 - 35 U/L   Alkaline Phosphatase 94  39 - 117 U/L   Total Bilirubin 0.6  0.3 - 1.2 mg/dL   Bilirubin, Direct 0.2  0.0 - 0.3 mg/dL   Indirect Bilirubin 0.4  0.3 - 0.9 mg/dL  MAGNESIUM     Status: None   Collection Time    02/03/13  6:50 AM      Result Value Range   Magnesium 1.7  1.5 - 2.5 mg/dL  PHOSPHORUS     Status: Abnormal   Collection Time    02/03/13  6:50 AM      Result Value Range   Phosphorus 0.6 (*) 2.3 - 4.6 mg/dL   Comment: CRITICAL RESULT CALLED TO, READ BACK BY AND VERIFIED WITH:     DAVISWRN [id number] [place]  ACETAMINOPHEN LEVEL     Status: None   Collection Time    02/03/13  6:50 AM      Result Value Range   Acetaminophen (Tylenol), Serum <15.0  10 - 30 ug/mL   Comment:            THERAPEUTIC CONCENTRATIONS VARY     SIGNIFICANTLY. A RANGE OF 10-30     ug/mL MAY BE AN EFFECTIVE     CONCENTRATION  FOR MANY PATIENTS.     HOWEVER, SOME ARE BEST TREATED     AT CONCENTRATIONS OUTSIDE THIS     RANGE.     ACETAMINOPHEN CONCENTRATIONS     >150 ug/mL AT 4 HOURS AFTER     INGESTION AND >50 ug/mL AT 12     HOURS AFTER INGESTION ARE     OFTEN ASSOCIATED WITH TOXIC     REACTIONS.  TROPONIN I     Status: None   Collection Time    02/03/13  6:50 AM      Result Value Range   Troponin I <0.30  <0.30 ng/mL   Comment:            Due to the release kinetics of cTnI,     a negative result within the first hours     of the onset of symptoms does not rule out     myocardial infarction with certainty.     If myocardial infarction is still suspected,     repeat the test at appropriate intervals.  BASIC METABOLIC PANEL     Status: Abnormal   Collection Time    02/03/13 11:16 AM      Result Value Range   Sodium 142  135 - 145 mEq/L   Potassium 4.0  3.5 - 5.1 mEq/L   Chloride 110  96 - 112 mEq/L   CO2 19  19 - 32 mEq/L   Glucose, Bld 115 (*) 70 - 99 mg/dL   BUN 6  6 - 23 mg/dL   Creatinine, Ser 6.57  0.50 - 1.10 mg/dL   Calcium 8.9  8.4 - 84.6 mg/dL   GFR calc non Af Amer >90  >90 mL/min   GFR calc Af Amer >90  >90 mL/min   Comment:            The eGFR has been calculated     using the CKD EPI equation.     This calculation has not been     validated in all clinical     situations.     eGFR's persistently     <90 mL/min signify     possible Chronic Kidney Disease.  HEPATIC FUNCTION PANEL     Status: Abnormal   Collection Time    02/03/13 11:16 AM      Result Value Range   Total Protein 6.0  6.0 - 8.3 g/dL  Albumin 2.6 (*) 3.5 - 5.2 g/dL   AST 1610 (*) 0 - 37 U/L   ALT 1693 (*) 0 - 35 U/L   Alkaline Phosphatase 83  39 - 117 U/L   Total Bilirubin 0.6  0.3 - 1.2 mg/dL   Bilirubin, Direct 0.2  0.0 - 0.3 mg/dL   Indirect Bilirubin 0.4  0.3 - 0.9 mg/dL  PROTIME-INR     Status: Abnormal   Collection Time    02/03/13 11:16 AM      Result Value Range   Prothrombin Time 17.5 (*) 11.6  - 15.2 seconds   INR 1.48  0.00 - 1.49  GLUCOSE, CAPILLARY     Status: Abnormal   Collection Time    02/03/13 12:06 PM      Result Value Range   Glucose-Capillary 107 (*) 70 - 99 mg/dL   Comment 1 Notify RN      Dg Chest 1 View  02/01/2013  *RADIOLOGY REPORT*  Clinical Data: Shortness of breath.  CHEST - 1 VIEW  Comparison: PA and lateral chest 02/27/2006.  CT chest 02/24/2006.  Findings: Lung volumes are low.  There is focal airspace disease in the left mid lung zone.  Right lung appears grossly clear. Cardiomegaly is noted.  IMPRESSION:  1.  Focal airspace disease left mid lung zone worrisome for pneumonia. 2.  Cardiomegaly.   Original Report Authenticated By: Holley Dexter, M.D.    Ct Head Wo Contrast  02/01/2013  *RADIOLOGY REPORT*  Clinical Data: Neighbor found pt laying on ground next to bed, lethargic and unresponsive  CT HEAD WITHOUT CONTRAST  Technique:  Contiguous axial images were obtained from the base of the skull through the vertex without contrast.  Comparison: None.  Findings: No acute intracranial hemorrhage.  No focal mass lesion. No CT evidence of acute infarction.   No midline shift or mass effect.  No hydrocephalus.  Basilar cisterns are patent.  Mild cortical atrophy.  Mastoid air cells are clear.  There is mild mucosal periosteal thickening in the maxillary sinuses.  IMPRESSION:  1.  No acute intracranial findings. 2.  Mild atrophy. 3.  Chronic sinus disease.   Original Report Authenticated By: Genevive Bi, M.D.    Ct Cervical Spine Wo Contrast  02/01/2013  *RADIOLOGY REPORT*  Clinical Data: Found on ground; lethargic and unresponsive. Concern for cervical spine injury.  CT CERVICAL SPINE WITHOUT CONTRAST  Technique:  Multidetector CT imaging of the cervical spine was performed. Multiplanar CT image reconstructions were also generated.  Comparison: None.  Findings: There is no evidence of fracture or subluxation. Vertebral bodies demonstrate normal height and alignment.   There is multilevel disc space narrowing along the cervical spine, with associated anterior and posterior disc osteophyte complexes and underlying facet disease.  Prevertebral soft tissues are within normal limits.  The thyroid gland is unremarkable in appearance.  Mild atelectasis is noted at the visualized lung apices.  No significant soft tissue abnormalities are seen.  Dense calcification is suggested along the right innominate artery.  IMPRESSION:  1.  No evidence of fracture or subluxation along the cervical spine. 2.  Mild degenerative change noted along the cervical spine. 3.  Mild atelectasis at the visualized lung apices.   Original Report Authenticated By: Tonia Ghent, M.D.    Ir Fluoro Guide Cv Line Right  02/03/2013  *RADIOLOGY REPORT*  Indication: Poor venous access, concern for impending hepatic failure with coagulopathy, need for frequent lab checks and reliable intravenous access  ULTRASOUND AND FLUORSCOPIC GUIDED  PICC LINE INSERTION  Intravenous Medications: None  Contrast: None  Fluoroscopy Time:  0.6 minutes.  Complications: None immediate  Technique / Findings:  The procedure, risks, benefits, and alternatives were explained to the patient's family and informed written consent was obtained.  A timeout was performed prior to the initiation of the procedure.  The right upper extremity was prepped with chlorhexidine in a sterile fashion, and a sterile drape was applied covering the operative field.  Maximum barrier sterile technique with sterile gowns and gloves were used for the procedure.  A timeout was performed prior to the initiation of the procedure.  Local anesthesia was provided with 1% lidocaine.  Under direct ultrasound guidance, the rightbasilicvein was accessed with a micropuncture kit after the overlying soft tissues were anesthetized with 1% lidocaine.  An ultrasound image was saved for documentation purposes.  A guidewire was advanced to the level of the superior caval-atrial  junction for measurement purposes and the PICC line was cut to length.  A peel-away sheath was placed and a 40 cm, 5 [place], dual lumen was inserted to level of the superior caval-atrial junction.  A post procedure spot fluoroscopic was obtained.  The catheter easily aspirated and flushed and was sutured in place.  A dressing was placed.  The patient tolerated the procedure well without immediate post procedural complication.  Impression:  Successful ultrasound and fluoroscopic guided placement of a right basilic vein approach, 40 cm, 5 French,dual lumen PICC with tip at the superior caval-atrial junction.  The PICC line is ready for immediate use.   Original Report Authenticated By: Tacey Ruiz, MD    Ir US Guide Vasc Access Right  02/03/2013  *RADIOLOGY REPORT*  Indication: Poor venous access, concern for impending hepatic failure with coagulopathy, need for frequent lab checks and reliable intravenous access  ULTRASOUND AND FLUORSCOPIC GUIDED PICC LINE INSERTION  Intravenous Medications: None  Contrast: None  Fluoroscopy Time:  0.6 minutes.  Complications: None immediate  Technique / Findings:  The procedure, risks, benefits, and alternatives were explained to the patient's family and informed written consent was obtained.  A timeout was performed prior to the initiation of the procedure.  The right upper extremity was prepped with chlorhexidine in a sterile fashion, and a sterile drape was applied covering the operative field.  Maximum barrier sterile technique with sterile gowns and gloves were used for the procedure.  A timeout was performed prior to the initiation of the procedure.  Local anesthesia was provided with 1% lidocaine.  Under direct ultrasound guidance, the rightbasilicvein was accessed with a micropuncture kit after the overlying soft tissues were anesthetized with 1% lidocaine.  An ultrasound image was saved for documentation purposes.  A guidewire was advanced to the level of the superior  caval-atrial junction for measurement purposes and the PICC line was cut to length.  A peel-away sheath was placed and a 40 cm, 5 [place], dual lumen was inserted to level of the superior caval-atrial junction.  A post procedure spot fluoroscopic was obtained.  The catheter easily aspirated and flushed and was sutured in place.  A dressing was placed.  The patient tolerated the procedure well without immediate post procedural complication.  Impression:  Successful ultrasound and fluoroscopic guided placement of a right basilic vein approach, 40 cm, 5 French,dual lumen PICC with tip at the superior caval-atrial junction.  The PICC line is ready for immediate use.   Original Report Authenticated By: Tacey Ruiz, MD    Dg Chest [facility]  1 View  02/03/2013  *RADIOLOGY REPORT*  Clinical Data: Altered mental status.  PORTABLE CHEST - 1 VIEW  Comparison: Chest 02/02/2013.  Findings: Mild elevation of the right hemidiaphragm is again seen. There is some coarsening of the pulmonary interstitium.  No consolidative process, pneumothorax or effusion is identified. Cardiomegaly is noted.  IMPRESSION: No acute abnormality.   Original Report Authenticated By: Holley Dexter, M.D.    Dg Chest Port 1 View  02/02/2013  *RADIOLOGY REPORT*  Clinical Data: Evaluate bilateral lower lobe infiltrate  PORTABLE CHEST - 1 VIEW  Comparison: 02/01/2013  Findings: Borderline cardiomegaly.  Significant improvement in aeration.  No acute infiltrate or pulmonary edema.  Bony thorax is stable.  IMPRESSION: Significant improved aeration without acute infiltrate or pulmonary edema.   Original Report Authenticated By: Natasha Mead, M.D.     ROS Blood pressure 129/69, pulse 98, temperature 99 F (37.2 C), temperature source Oral, resp. rate 16, height 5\' 3"  (1.6 m), weight 93.5 kg (206 lb 2.1 oz), SpO2 98.00%. Physical Exam      Mental Status Examination/Evaluation:  Appearance: on bed sleepy  Eye Contact:: poor  Speech:  normal  Volume: Normal  Mood: depressed  Affect: ristricted  Thought Process: organized  Orientation: Full  Thought Content: NO AVH  Suicidal Thoughts: No  Homicidal Thoughts: no  Memory: Recent; Poor  Judgement: Impaired  Insight: Lacking  Psychomotor Activity: Normal  Concentration: Fair  Recall: Fair  Akathisia: No  Assessment:  AXIS I: Depressive d/o nos  AXIS II: Deferred  AXIS III: see emdical hx ? ?  ? ?  ?  ? ?  ?  ? ?  ?  ? ?  ?  ?  AXIS [id number]  AXIS V: 30  ?  Treatment Plan/Recommendations:   1.will continue to evaluate her tomorrow. She could not talk much due to being very sleepy and confused today.     Wonda Cerise 02/03/2013, 3:33 PM

## 2013-02-03 NOTE — Progress Notes (Signed)
@  approx. 4540 pt converted from SR/ST (90s-110s) to junctional tachycardia (130s-150s). BPs remained and still remain stable (see flowsheet) along with other VS. Supplemental O2 added for support, STAT EKG performed confirming rhythm change, and MD (Dr. Brien Few of [facility]) paged. Page returned @ [phone] and orders received for STAT cardiac enzymes x3 q6h and to pass along to day RN that Cardiology should be consulted. Pt appears stable, and is mentating as she was previously (lethargic/groggy but oriented). Pt denies SOB, and endorses mild chest ache that has persisted since admission. Will continue to monitor and assess.

## 2013-02-03 NOTE — Progress Notes (Signed)
[facility] Progress Note [place] TEAM 1 - Stepdown/ICU TEAM   Elaine Byrd OZH:[id number] DOB: September 26, 1946 DOA: 02/01/2013 PCP: Ron Parker, MD  Brief narrative: 67 y.o. female with history of hypothyroidism asthma and depression who was brought to the ER after being found on the floor lethargic. As per patient's son and daughter who provided the history they had attempted to contact the patient and when the patient was not answering her phone they called the police. When the police first went to the house the pt was stating she had taken some NyQuil and was drowsy. The following day they tried to reach her over the phone again - when there was no answer her son went directly to check on her at her house when she was found to be on the floor lethargic. Her Vicodin bottles and Klonopin bottles were empty - both were refilled a week previously. In the ER patient was found to have elevated LFTs normal INR and elevated Tylenol level. Patient was admitted for Tylenol overdose with suicidal ideation.   Assessment/Plan:  Apparent intentional tylenol OD Remains on mucomyst IV - plan to continue for 20hrs minimal, and not stop until ALT/AST improved markedly, APAP is undetectable, and INR normalized - initial level 273 ~4hrs post ingestion - as per PCCM "High risk for hepatic failure. If MELD score rises 3/7 then strongly consider move to ICU, CVC placement in anticipation of need for more aggressive care (before coagulopathy prohibits line placement). If she shows signs hepatic failure would transfer to a Transplant Center." - at present the patient is showing early signs of recovery with transaminases apparently having peaked - we will continue to trend her LFTs, INR, creatinine, electrolytes, hemoglobin, and mental status very closely  Apparent intentional narcotic and benzo OD Pt is now alert and conversant, though mildly confused - clinically she appears to have survived her narcotic and  benzo overdose without lasting effect  Suicidal Ideation w/ hx of depression Pt freely admits to me that she was trying to kill herself - Psychiatry has been consulted - suicide precautions and sitter until otherwise stated by Psych MD  Bibasilar infiltrates Was tx w/ a single dose of levaquin - high risk for aspiration given HPI - recheck CXR suggest no lasting infiltrate and clinically the patient is stable - I will not continue antibiotics at this time  Hypokalemia Replaced and stable at this time  Hypophosphatemia - critical Continue maintenance oral dosing and supplement with IV dose now that level is less than 1.0  Hypothyroidism Continue Synthroid therapy  Hx of EtOH abuse - now moderate intake  Patient has a history of heavy alcohol intake the family has reported that she now limits herself to approximately 2 drinks per week  Code Status: FULL  Family Communication: No family present Disposition Plan: SDU  Consultants: Psychiatry Critical care medicine  Procedures: 3/8 - PICC line placement in radiology  Antibiotics: Levaquin 3/6   DVT prophylaxis: SCDs   HPI/Subjective: Patient is mildly confused but alert and interactive.  She denies any complaints whatsoever.  She laughs inappropriately.  Objective: Blood pressure 129/69, pulse 88, temperature 98.2 F (36.8 C), temperature source Oral, resp. rate 17, height 5\' 3"  (1.6 m), weight 93.5 kg (206 lb 2.1 oz), SpO2 96.00%.  Intake/Output Summary (Last 24 hours) at 02/03/13 1759 Last data filed at 02/03/13 1700  Gross per 24 hour  Intake   2725 ml  Output      0 ml  Net  2725 ml   Exam: General: No acute respiratory distress Lungs: Clear to auscultation bilaterally without wheezes or crackles Cardiovascular: Regular rate and rhythm without murmur gallop or rub  Abdomen: Nontender, nondistended, soft, bowel sounds positive, no rebound, no ascites, no appreciable mass Extremities: No significant cyanosis,  clubbing, or edema bilateral lower extremities  Data Reviewed: Basic Metabolic Panel:  Recent Labs Lab 02/02/13 1158 02/02/13 1754 02/02/13 2337 02/03/13 0650 02/03/13 1116  NA 141 140 140 141 142  K 3.2* 4.0 3.5 3.7 4.0  CL 106 105 109 109 110  CO2 21 22 23 19 19   GLUCOSE 99 90 99 100* 115*  BUN 21 18 12 8 6   CREATININE 0.54 0.56 0.59 0.50 0.52  CALCIUM 9.0 8.8 8.9 9.1 8.9  MG  --  2.1  --  1.7  --   PHOS  --  1.0*  --  0.6*  --    Liver Function Tests:  Recent Labs Lab 02/02/13 1158 02/02/13 1754 02/02/13 2337 02/03/13 0650 02/03/13 1116  AST 1074* 2254* 2918* 2027* 1395*  ALT 823* 1603* 2195* 2028* 1693*  ALKPHOS 90 92 91 94 83  BILITOT 0.8 0.5 0.4 0.6 0.6  PROT 6.3 6.5 6.5 6.4 6.0  ALBUMIN 2.9* 3.0* 2.9* 2.9* 2.6*    Recent Labs Lab 02/01/13 1946  AMMONIA 36   CBC:  Recent Labs Lab 02/01/13 1947 02/02/13 0550 02/03/13 0650  WBC 18.7* 11.0* 8.4  NEUTROABS 17.1*  --   --   HGB 15.0 12.8 12.8  HCT 44.2 38.5 37.8  MCV 93.8 92.8 91.7  PLT 348 233 199   Cardiac Enzymes:  Recent Labs Lab 02/01/13 1947 02/02/13 0159 02/03/13 0650  CKTOTAL 206*  --   --   TROPONINI <0.30 <0.30 <0.30   CBG:  Recent Labs Lab 02/02/13 1123 02/02/13 1547 02/02/13 2339 02/03/13 0640 02/03/13 1206  GLUCAP 93 104* 103* 85 107*    Recent Results (from the past 240 hour(s))  URINE CULTURE     Status: None   Collection Time    02/01/13  8:02 PM      Result Value Range Status   Specimen Description URINE, CATHETERIZED   Final   Special Requests NONE   Final   Culture  Setup Time 02/02/2013 04:45   Final   Colony Count NO GROWTH   Final   Culture NO GROWTH   Final   Report Status 02/03/2013 FINAL   Final  MRSA PCR SCREENING     Status: None   Collection Time    02/02/13  1:25 AM      Result Value Range Status   MRSA by PCR NEGATIVE  NEGATIVE Final   Comment:            The GeneXpert MRSA Assay (FDA     approved for NASAL specimens     only), is one  component of a     comprehensive MRSA colonization     surveillance program. It is not     intended to diagnose MRSA     infection nor to guide or     monitor treatment for     MRSA infections.     Studies:  Recent x-ray studies have been reviewed in detail by the Attending Physician  Scheduled Meds:  Scheduled Meds: . acetylcysteine  150 mg/kg Intravenous Once  . folic acid  1 mg Intravenous Daily  . levothyroxine  25 mcg Intravenous Daily  . phosphorus  500 mg Oral TID  .  potassium phosphate IVPB (mmol)  30 mmol Intravenous Once  . sodium chloride  3 mL Intravenous Q12H   Continuous Infusions: . sodium chloride 75 mL/hr (02/03/13 1638)  . acetylcysteine 15 mg/kg/hr (02/03/13 1700)    Time spent on care of this patient: 80   [facility] T  [facility] Office  [phone] Pager - Text Page per Loretha Stapler as per below:  On-Call/Text Page:      [email or web address]      password [email or web address]  If 7PM-7AM, please contact night-coverage [email or web address] Password [email or web address] 02/03/2013, 5:59 PM   LOS: 2 days

## 2013-02-03 NOTE — Procedures (Signed)
Successful placement of right basilic vein approach 40 cm dual lumen PICC line with tip at the superior caval-atrial junction.  The PICC line is ready for immediate use. 

## 2013-02-04 LAB — BASIC METABOLIC PANEL
Calcium: 9 mg/dL (ref 8.4–10.5)
Calcium: 9.1 mg/dL (ref 8.4–10.5)
Creatinine, Ser: 0.4 mg/dL — ABNORMAL LOW (ref 0.50–1.10)
GFR calc Af Amer: >90 mL/min (ref >90)
GFR calc Af Amer: >90 mL/min (ref >90)
GFR calc non Af Amer: >90 mL/min (ref >90)
Potassium: 3.7 mEq/L (ref 3.5–5.1)
Sodium: 142 mEq/L (ref 135–145)

## 2013-02-04 LAB — HEPATIC FUNCTION PANEL
AST: 235 U/L — ABNORMAL HIGH (ref 0–37)
Albumin: 2.9 g/dL — ABNORMAL LOW (ref 3.5–5.2)
Indirect Bilirubin: 0.4 mg/dL (ref 0.3–0.9)
Total Bilirubin: 0.5 mg/dL (ref 0.3–1.2)
Total Protein: 6.2 g/dL (ref 6.0–8.3)
Total Protein: 6.6 g/dL (ref 6.0–8.3)

## 2013-02-04 LAB — GLUCOSE, CAPILLARY
Glucose-Capillary: 97 mg/dL (ref 70–99)
Glucose-Capillary: 102 mg/dL — ABNORMAL HIGH (ref 70–99)
Glucose-Capillary: 112 mg/dL — ABNORMAL HIGH (ref 70–99)

## 2013-02-04 LAB — MAGNESIUM: Magnesium: 1.7 mg/dL (ref 1.5–2.5)

## 2013-02-04 LAB — PROTIME-INR: INR: 1.18 (ref 0.00–1.49)

## 2013-02-04 MED ORDER — SODIUM CHLORIDE 0.9 % IJ SOLN
INTRAMUSCULAR | Status: AC
  Filled 2013-02-04: qty 20

## 2013-02-04 MED ORDER — DEXTROSE 5 % IV SOLN
15.0000 mg/kg/h | INTRAVENOUS | Status: AC
  Administered 2013-02-04: 15 mg/kg/h via INTRAVENOUS
  Filled 2013-02-04 (×3): qty 50

## 2013-02-04 NOTE — Progress Notes (Signed)
Patient transferred to 3W room 5.   Report called to RN on 3W and all questions answered. Patient transferred via wheelchair with RN.  Family notified of new room assignment.

## 2013-02-04 NOTE — Progress Notes (Signed)
PULMONARY  / CRITICAL CARE MEDICINE  Name: Elaine Byrd MRN: [id number] DOB: [birth year]    ADMISSION DATE:  02/01/2013 CONSULTATION DATE:  02/02/13   REFERRING MD :  Sharyne Peach  CHIEF COMPLAINT:  Tylenol OD  BRIEF PATIENT DESCRIPTION: 67, hx depression, hypothyroid, ? Asthma. Admitted 3/6 pm after intentional ingestion benzos, narcs, tylenol (level=272). Started n-AC approx 23:30 3/6. PCCM consulted 3/7.   SIGNIFICANT EVENTS / STUDIES:  MELD 3/7 am >> 10  LINES / TUBES: none  CULTURES: Urine 3/6 >> Not collected  ANTIBIOTICS: levaquin 3/6 >> single dose   HISTORY OF PRESENT ILLNESS:  67 yo former tobacco, current moderate EtOH, hx depression, ? Asthma. Found down by family 65/6 pm, poorly responsive, lethargic. They noted empty vicodin, clonazepam bottles that had been filled x 1 week. Initial tylenol level 273 (at ~4h time point), MELD 10. More oriented 3/7 am and able to tell me that she took vicodin, benzos and a "large part" of a large, regular strength tylenol bottle. # pills unclear. IV n-AC was started approx midnight 02/02/13.   PAST MEDICAL HISTORY :  Past Medical History  Diagnosis Date  . Depression   . Asthma   . Hypothyroidism    Past Surgical History  Procedure Laterality Date  . Brain surgery    . Neck surgery    . Abdominal hysterectomy    . Cholecystectomy     Prior to Admission medications   Medication Sig Start Date End Date Taking? Authorizing Provider  albuterol (PROVENTIL HFA;VENTOLIN HFA) 108 (90 BASE) MCG/ACT inhaler Inhale 2 puffs into the lungs every 6 (six) hours as needed for wheezing. For wheezing   Yes Historical Provider, MD  ARIPiprazole (ABILIFY) 2 MG tablet Take 2 mg by mouth at bedtime.    Yes Historical Provider, MD  budesonide-formoterol (SYMBICORT) 160-4.5 MCG/ACT inhaler Inhale 2 puffs into the lungs 2 (two) times daily.   Yes Historical Provider, MD  clonazePAM (KLONOPIN) 1 MG tablet Take 1 mg by mouth 3 (three) times daily as  needed. For anxiety   Yes Historical Provider, MD  DULoxetine (CYMBALTA) 60 MG capsule Take 60 mg by mouth daily.   Yes Historical Provider, MD  fluticasone-salmeterol (ADVAIR HFA) 115-21 MCG/ACT inhaler Inhale 2 puffs into the lungs 2 (two) times daily.     Yes Historical Provider, MD  furosemide (LASIX) 20 MG tablet Take 20 mg by mouth every morning.    Yes Historical Provider, MD  HYDROcodone-acetaminophen (NORCO/VICODIN) 5-325 MG per tablet Take 1 tablet by mouth every 8 (eight) hours as needed for pain. For pain   Yes Historical Provider, MD  levothyroxine (SYNTHROID, LEVOTHROID) 50 MCG tablet Take 50 mcg by mouth daily.     Yes Historical Provider, MD  potassium chloride (KLOR-CON) 10 MEQ CR tablet Take 10 mEq by mouth every morning.    Yes Historical Provider, MD  zafirlukast (ACCOLATE) 20 MG tablet Take 20 mg by mouth 2 (two) times daily.   Yes Historical Provider, MD   Allergies  Allergen Reactions  . Morphine And Related Other (See Comments)    Unknown reaction per family  . Penicillins Other (See Comments)    Unknown reaction per family  . Sulfa Antibiotics Other (See Comments)    Unknown reaction per family    SUBJECTIVE:  Feels tired, denies pain  VITAL SIGNS: Temp:  [97.6 F (36.4 C)-99.6 F (37.6 C)] 98.4 F (36.9 C) (03/09 1200) Pulse Rate:  [85-102] 102 (03/09 1200) Resp:  [16-19] 19 (03/09  1200) BP: (131-153)/(68-80) 147/80 mmHg (03/09 1200) SpO2:  [90 %-99 %] 90 % (03/09 1200) Weight:  [93.4 kg (205 lb 14.6 oz)] 93.4 kg (205 lb 14.6 oz) (03/09 0428) HEMODYNAMICS:   VENTILATOR SETTINGS:   INTAKE / OUTPUT: Intake/Output     03/08 0701 - 03/09 0700 03/09 0701 - 03/10 0700   P.O. 600 350   I.V. (mL/kg) 2018.1 (21.6) 328 (3.5)   IV Piggyback 510    Total Intake(mL/kg) 3128.1 (33.5) 678 (7.3)   Total Output   0   Net +3128.1 +678        Urine Occurrence 6 x    Stool Occurrence 6 x      PHYSICAL EXAMINATION: General:  Obese woman, comfortable Neuro:   Awake, slightly lethargic, moves all ext HEENT:  OP moist, ruddy faces, large neck, no stridor Cardiovascular:  Regu,ar no M Lungs:  Decreased at bases, no wheeze or crackles Abdomen:  Obese, soft, benign Musculoskeletal:  No edema Skin:  No rash  LABS:  Recent Labs Lab 02/01/13 1947 02/02/13 0159 02/02/13 0208 02/02/13 0550  02/03/13 0650 02/03/13 1116 02/03/13 2020 02/04/13 0500 02/04/13 1200  HGB 15.0  --   --  12.8  --  12.8  --   --   --   --   WBC 18.7*  --   --  11.0*  --  8.4  --   --   --   --   PLT 348  --   --  233  --  199  --   --   --   --   NA 137  --   --  140  < > 141 142 141 143  --   K 4.2  --   --  3.0*  < > 3.7 4.0 4.2 3.8  --   CL 98  --   --  103  < > 109 110 108 108  --   CO2 20  --   --  23  < > 19 19 22 25   --   GLUCOSE 108*  --   --  86  < > 100* 115* 99 106*  --   BUN 26*  --   --  25*  < > 8 6 3* 3*  --   CREATININE 0.87  --   --  0.62  < > 0.50 0.52 0.44* 0.40*  --   CALCIUM 9.5  --   --  8.8  < > 9.1 8.9 8.9 9.0  --   MG  --   --   --   --   < > 1.7  --  1.6 1.7  --   PHOS  --   --   --   --   < > 0.6*  --  2.3 2.1*  --   AST 308*  --   --  499*  < > 2027* 1395* 787* 458*  --   ALT 247*  --   --  403*  < > 2028* 1693* 1417* 1180*  --   ALKPHOS 126*  --   --  96  < > 94 83 85 82  --   BILITOT 0.8  --   --  0.9  < > 0.6 0.6 0.5 0.6  --   PROT 8.3  --   --  6.7  < > 6.4 6.0 6.4 6.2  --   ALBUMIN 4.1  --   --  3.2*  < >  2.9* 2.6* 2.8* 2.7*  --   INR 1.17  --   --  1.32  < >  --  1.48  --  1.18 1.18  TROPONINI <0.30 <0.30  --   --   --  <0.30  --   --   --   --   PHART  --   --  7.331*  --   --   --   --   --   --   --   PCO2ART  --   --  42.3  --   --   --   --   --   --   --   PO2ART  --   --  69.0*  --   --   --   --   --   --   --   < > = values in this interval not displayed. Tylenol 4h >> 273 Tylenol 13h >> 94  MELD 3/7 am >> 10   Recent Labs Lab 02/03/13 0640 02/03/13 1206 02/03/13 1723 02/03/13 2349 02/04/13 0618  GLUCAP 85  107* 153* 110* 97    CXR: 3/6>> cardiomegaly, small volumes, significant atx vs infiltrate B LL, L > R CXR 3/8>> Findings: Mild elevation of the right hemidiaphragm is again seen. There is some coarsening of the pulmonary interstitium. No consolidative process, pneumothorax or effusion is identified. Cardiomegaly is noted.  IMPRESSION:  No acute abnormality.   ASSESSMENT / PLAN:  PULMONARY A:B basilar atx vs infiltrate ? Hx asthma P:   - repeat CXR now that she is more awake to eval for clearance=> no infiltrate... - continue NEBS - not requiring antibiotics  CARDIOVASCULAR A: hemodynamically stable P:  monitor  RENAL A:  High risk acute renal failure if evolves hepatic failure P:   - follow S Cr and lytes - hold home lasix  GASTROINTESTINAL A:  Tylenol OD, intentional. Initial MELD 10. n-AC started 3/7 at midnight P:   - continue n-AC IV for a minimum of 20 hours. Would not stop until serum acetaminophen concentration is undetectable, the ALT is clearly decreasing or in the normal range, and the INR < 2.0. - follow APAP level, LFT, BMP q6h x 4 and then adjust schedule depending on the trend.  - recheck ammonia and trend - High risk for hepatic failure. If MELD score rises 3/7 then strongly consider move to ICU, CVC placement in anticipation of need for more aggressive care (before coagulopathy prohibits line placement). If she shows signs hepatic failure would transfer to a Transplant Center - 3/9> LFTs continue to trend down, agree w/ stoping mucomist 7 continue to follow...  HEMATOLOGIC A:  leukocytosis P:  - follow CBC and clinical status - repeat CXR, consider abx   INFECTIOUS A:  At risk aspiration P:   - repeat CXR w/o infiltrate  ENDOCRINE A:  hypothyroidism   P:   - Start Synthroid IV until we are sure she is safe for PO's  NEUROLOGIC A: lethargy, multifactorial due to meds. At high risk for hepatic encephalopathy P:   - follow neuro status closely -  hold home abilify, clonazepam, cymbalta - will need psych consult, would call them now   [facility]. Kriste Basque, MD  02/04/2013, 1:17 PM [facility]
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
## 2013-02-04 NOTE — Progress Notes (Signed)
Per poison control, may d/c acetadone gtt.  Poison control signing off on pt.  MD ordered to continue acetadone gtt.    Maximino Greenland RN

## 2013-02-04 NOTE — Progress Notes (Signed)
[facility] Progress Note [place] TEAM 1 - Stepdown/ICU TEAM   Elaine Byrd ZOX:[id number] DOB: [birth year] DOA: 02/01/2013 PCP: Ron Parker, MD  Brief narrative: 67 y.o. female with history of hypothyroidism asthma and depression who was brought to the ER after being found on the floor lethargic. As per patient's son and daughter who provided the history they had attempted to contact the patient and when the patient was not answering her phone they called the police. When the police first went to the house the pt was stating she had taken some NyQuil and was drowsy. The following day they tried to reach her over the phone again - when there was no answer her son went directly to check on her at her house when she was found to be on the floor lethargic. Her Vicodin bottles and Klonopin bottles were empty - both were refilled a week previously. In the ER patient was found to have elevated LFTs normal INR and elevated Tylenol level. Patient was admitted for Tylenol overdose with suicidal ideation.   The pt underwent tx w/ a continuous mucomyst IV gtt.  Electrolytes have been followed closely, and replaced as needed.  ALT, INR, and APAP levels have been trended.  As of 02/04/2013 the patient is exhibiting clear improvement, and is being transitioned off her mucomyst gtt.  Psychiatry is following to address her SI.  Assessment/Plan:  Apparent intentional tylenol OD initial level 273 ~4hrs post ingestion - ALT already elevated at time of presentation - treated with mucomyst IV - has been cleared to stop mucomyst per Poison Control as of 3/9 at 3am - ALT improved markedly, APAP is undetectable, and INR is <2.0 - I agree it is safe to stop mucomyst at this time - we will continue to trend her LFTs, INR, creatinine, electrolytes, hemoglobin, and mental status very closely   Apparent intentional narcotic and benzo OD Pt is now alert and conversant - clinically she appears to have survived her  narcotic and benzo overdose without lasting effect - will avoid use of narcotics or benzos   Suicidal Ideation w/ hx of depression Pt freely admited to Dr. Sharon Seller that she was trying to kill herself - Psychiatry has been consulted - suicide precautions and sitter until otherwise stated by Psych MD  Bibasilar infiltrates Was tx w/ a single dose of levaquin - high risk for aspiration given HPI - recheck CXR suggest no lasting infiltrate and clinically the patient is stable - no indication for continued abx tx  Hypokalemia Replaced and stable at this time  Hypophosphatemia - critical Continue maintenance oral dosing and supplement with IV dose as needed when level is less than 1.0  Hypothyroidism Continue Synthroid therapy  Hx of EtOH abuse - now moderate intake  Patient has a history of heavy alcohol intake - the family has reported that she now limits herself to approximately 2 drinks per week - no evidence of withdrawal  Code Status: FULL  Family Communication: No family present Disposition Plan: transfer to tele bed   Consultants: Psychiatry Critical care medicine  Procedures: 3/8 - PICC line placement in radiology  Antibiotics: Levaquin 3/6   DVT prophylaxis: SCDs   HPI/Subjective: Pt is alert, oriented, and conversant.  Her affect is somewhat flat.  She c/o "aching all over" but denies n/v, sob, abdom pain, or chest pain.    Objective: Blood pressure 143/68, pulse 92, temperature 99.6 F (37.6 C), temperature source Oral, resp. rate 17, height 5\' 3"  (1.6 m),  weight 93.4 kg (205 lb 14.6 oz), SpO2 96.00%.  Intake/Output Summary (Last 24 hours) at 02/04/13 0856 Last data filed at 02/04/13 0744  Gross per 24 hour  Intake 2820.1 ml  Output      0 ml  Net 2820.1 ml   Exam: General: No acute respiratory distress Lungs: Clear to auscultation bilaterally without wheezes or crackles Cardiovascular: Regular rate and rhythm without murmur gallop or rub  Abdomen:  Nontender, nondistended, soft, bowel sounds positive, no rebound, no ascites, no appreciable mass Extremities: No significant cyanosis, clubbing, edema bilateral lower extremities  Data Reviewed: Basic Metabolic Panel:  Recent Labs Lab 02/02/13 1158 02/02/13 1754 02/02/13 2337 02/03/13 0650 02/03/13 1116 02/03/13 2020 02/04/13 0500  NA 141 140 140 141 142 141 143  K 3.2* 4.0 3.5 3.7 4.0 4.2 3.8  CL 106 105 109 109 110 108 108  CO2 21 22 23 19 19 22 25   GLUCOSE 99 90 99 100* 115* 99 106*  BUN 21 18 12 8 6  3* 3*  CREATININE 0.54 0.56 0.59 0.50 0.52 0.44* 0.40*  CALCIUM 9.0 8.8 8.9 9.1 8.9 8.9 9.0  MG  --  2.1  --  1.7  --  1.6 1.7  PHOS  --  1.0*  --  0.6*  --  2.3 2.1*   Liver Function Tests:  Recent Labs Lab 02/02/13 2337 02/03/13 0650 02/03/13 1116 02/03/13 2020 02/04/13 0500  AST 2918* 2027* 1395* 787* 458*  ALT 2195* 2028* 1693* 1417* 1180*  ALKPHOS 91 94 83 85 82  BILITOT 0.4 0.6 0.6 0.5 0.6  PROT 6.5 6.4 6.0 6.4 6.2  ALBUMIN 2.9* 2.9* 2.6* 2.8* 2.7*    Recent Labs Lab 02/01/13 1946  AMMONIA 36   CBC:  Recent Labs Lab 02/01/13 1947 02/02/13 0550 02/03/13 0650  WBC 18.7* 11.0* 8.4  NEUTROABS 17.1*  --   --   HGB 15.0 12.8 12.8  HCT 44.2 38.5 37.8  MCV 93.8 92.8 91.7  PLT 348 233 199   Cardiac Enzymes:  Recent Labs Lab 02/01/13 1947 02/02/13 0159 02/03/13 0650  CKTOTAL 206*  --   --   TROPONINI <0.30 <0.30 <0.30   CBG:  Recent Labs Lab 02/03/13 0640 02/03/13 1206 02/03/13 1723 02/03/13 2349 02/04/13 0618  GLUCAP 85 107* 153* 110* 97    Recent Results (from the past 240 hour(s))  URINE CULTURE     Status: None   Collection Time    02/01/13  8:02 PM      Result Value Range Status   Specimen Description URINE, CATHETERIZED   Final   Special Requests NONE   Final   Culture  Setup Time 02/02/2013 04:45   Final   Colony Count NO GROWTH   Final   Culture NO GROWTH   Final   Report Status 02/03/2013 FINAL   Final  MRSA PCR  SCREENING     Status: None   Collection Time    02/02/13  1:25 AM      Result Value Range Status   MRSA by PCR NEGATIVE  NEGATIVE Final   Comment:            The GeneXpert MRSA Assay (FDA     approved for NASAL specimens     only), is one component of a     comprehensive MRSA colonization     surveillance program. It is not     intended to diagnose MRSA     infection nor to guide or  monitor treatment for     MRSA infections.     Studies:  Recent x-ray studies have been reviewed in detail by the Attending Physician  Scheduled Meds:  Scheduled Meds: . acetylcysteine  150 mg/kg Intravenous Once  . folic acid  1 mg Oral Daily  . levothyroxine  50 mcg Oral QAC breakfast  . phosphorus  500 mg Oral TID  . sodium chloride  3 mL Intravenous Q12H  . thiamine  100 mg Oral Daily   Continuous Infusions: . sodium chloride 50 mL/hr (02/03/13 1818)  . acetylcysteine 15 mg/kg/hr (02/04/13 0719)   Time spent on care of this patient: 4  [facility] T  [facility] Office  [phone] Pager - Text Page per Loretha Stapler as per below:  On-Call/Text Page:      [email or web address]      password [email or web address]  If 7PM-7AM, please contact night-coverage [email or web address] Password [email or web address] 02/04/2013, 8:56 AM   LOS: 3 days

## 2013-02-05 DIAGNOSIS — F4321 Adjustment disorder with depressed mood: Secondary | ICD-10-CM

## 2013-02-05 LAB — BASIC METABOLIC PANEL
Calcium: 9.1 mg/dL (ref 8.4–10.5)
GFR calc Af Amer: >90 mL/min (ref >90)
GFR calc non Af Amer: >90 mL/min (ref >90)
Glucose, Bld: 95 mg/dL (ref 70–99)
Sodium: 144 mEq/L (ref 135–145)

## 2013-02-05 LAB — PHOSPHORUS: Phosphorus: 3.1 mg/dL (ref 2.3–4.6)

## 2013-02-05 LAB — GLUCOSE, CAPILLARY: Glucose-Capillary: 100 mg/dL — ABNORMAL HIGH (ref 70–99)

## 2013-02-05 LAB — HEPATIC FUNCTION PANEL
Albumin: 3 g/dL — ABNORMAL LOW (ref 3.5–5.2)
Total Bilirubin: 0.7 mg/dL (ref 0.3–1.2)

## 2013-02-05 LAB — PROTIME-INR
INR: 0.99 (ref 0.00–1.49)
Prothrombin Time: 13 seconds (ref 11.6–15.2)

## 2013-02-05 MED ORDER — SODIUM CHLORIDE 0.9 % IJ SOLN
10.0000 mL | INTRAMUSCULAR | Status: DC | PRN
  Administered 2013-02-05 – 2013-02-06 (×3): 10 mL

## 2013-02-05 NOTE — Progress Notes (Signed)
Met with patient who is sitting in bed laughing with her sitter- patient spoke very openly with me about the "incident" that led to her overdose- SHe is adamant that this was a "one time" thing and voices comfort in knowing that "God isn't done with me yet."  Patient report she gets her anti-depressants through her PCP and does not receive counseling/therapy. She is open to this as well as being very agreeable to plans per Dr. Ferol Luz for outpatient f/u with her family- I have provided her with info for the walk-in clinic at [facility] the [place]. Will f/u tomorrow if patient remains here-  Reece Levy, MSW, [facility] [phone]

## 2013-02-05 NOTE — Progress Notes (Signed)
Clinical Social Worker reviewed chart and made referral to [facility].    Angelia Mould, MSW, [place] [phone]
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
## 2013-02-05 NOTE — Progress Notes (Signed)
[facility] PROGRESS NOTE  Elaine Byrd WJX:[id number] DOB: [birth year] DOA: 02/01/2013 PCP: Ron Parker, MD  Assessment/Plan: Principal Problem:   Tylenol overdose Active Problems:   Suicidal ideation   Depression   Hypothyroidism   Asthma     *Brief narrative:  67 y.o. female with history of hypothyroidism asthma and depression who was brought to the ER after being found on the floor lethargic. As per patient's son and daughter who provided the history they had attempted to contact the patient and when the patient was not answering her phone they called the police. When the police first went to the house the pt was stating she had taken some NyQuil and was drowsy. The following day they tried to reach her over the phone again - when there was no answer her son went directly to check on her at her house when she was found to be on the floor lethargic. Her Vicodin bottles and Klonopin bottles were empty - both were refilled a week previously. In the ER patient was found to have elevated LFTs normal INR and elevated Tylenol level. Patient was admitted for Tylenol overdose with suicidal ideation.  The pt underwent tx w/ a continuous mucomyst IV gtt. Electrolytes have been followed closely, and replaced as needed. ALT, INR, and APAP levels have been trended. As of 02/04/2013 the patient is exhibiting clear improvement, and is being transitioned off her mucomyst gtt. Psychiatry is following to address her SI.    Assessment/Plan:  Apparent intentional tylenol OD  initial level 273 ~4hrs post ingestion - ALT already elevated at time of presentation - treated with mucomyst IV - has been cleared to stop mucomyst per Poison Control as of 3/9 at 3am - ALT improved markedly, APAP is undetectable, and INR is <2.0 - I agree it is safe to stop mucomyst at this time - we will continue to trend her LFTs, INR, creatinine, electrolytes, hemoglobin, and mental status very closely  Recheck  LFTs  Apparent intentional narcotic and benzo OD  Pt is now alert and conversant - clinically she appears to have survived her narcotic and benzo overdose without lasting effect - will avoid use of narcotics or benzos       Suicidal Ideation w/ hx of depression  Pt freely admited to Dr. Sharon Byrd that she was trying to kill herself - Psychiatry has been consulted - suicide precautions and sitter until otherwise stated by Psych MD  hold home abilify, clonazepam, cymbalta  Bibasilar infiltrates  Was tx w/ a single dose of levaquin - high risk for aspiration given HPI - recheck CXR suggest no lasting infiltrate and clinically the patient is stable - no indication for continued abx tx   Hypokalemia  Replaced and stable at this time   Hypophosphatemia - critical  Continue maintenance oral dosing and supplement with IV dose as needed when level is less than 1.0   Hypothyroidism  Continue Synthroid therapy   Hx of EtOH abuse - now moderate intake  Patient has a history of heavy alcohol intake - the family has reported that she now limits herself to approximately 2 drinks per week - no evidence of withdrawal    Code Status: FULL  Family Communication: No family present  Disposition Plan: Dc sitter , no need for inpt psyche  Consultants:  Psychiatry  Critical care medicine  Procedures:  3/8 - PICC line placement in radiology  Antibiotics:  Levaquin 3/6  DVT prophylaxis:  SCDs  HPI/Subjective:  Pt is alert,  oriented, and conversant. Her affect is somewhat flat. She c/o "aching all over" but denies n/v, sob, abdom pain, or chest pain.   Objective: Filed Vitals:   02/04/13 1542 02/04/13 1718 02/04/13 2100 02/05/13 0500  BP: 153/82 161/84 138/82 137/81  Pulse: 99 92 104 103  Temp: 98.4 F (36.9 C) 98 F (36.7 C) 98.5 F (36.9 C) 98.8 F (37.1 C)  TempSrc: Oral Oral    Resp: 15 18 16 16   Height:      Weight:      SpO2: 96% 97% 96% 92%    Intake/Output Summary (Last 24  hours) at 02/05/13 1223 Last data filed at 02/05/13 0716  Gross per 24 hour  Intake    453 ml  Output      0 ml  Net    453 ml    Exam:  General: Obese woman, comfortable  Neuro: Awake, slightly lethargic, moves all ext  HEENT: OP moist, ruddy faces, large neck, no stridor  Cardiovascular: Regu,ar no M  Lungs: Decreased at bases, no wheeze or crackles  Abdomen: Obese, soft, benign  Musculoskeletal: No edema  Skin: No rash    Data Reviewed: Basic Metabolic Panel:  Recent Labs Lab 02/02/13 1754  02/03/13 0650 02/03/13 1116 02/03/13 2020 02/04/13 0500 02/04/13 1800 02/05/13 0545  NA 140  < > 141 142 141 143 142 144  K 4.0  < > 3.7 4.0 4.2 3.8 3.7 3.5  CL 105  < > 109 110 108 108 106 108  CO2 22  < > 19 19 22 25 27 28   GLUCOSE 90  < > 100* 115* 99 106* 102* 95  BUN 18  < > 8 6 3* 3* 3* 3*  CREATININE 0.56  < > 0.50 0.52 0.44* 0.40* 0.53 0.53  CALCIUM 8.8  < > 9.1 8.9 8.9 9.0 9.1 9.1  MG 2.1  --  1.7  --  1.6 1.7  --   --   PHOS 1.0*  --  0.6*  --  2.3 2.1* 3.8 3.1  < > = values in this interval not displayed.  Liver Function Tests:  Recent Labs Lab 02/03/13 1116 02/03/13 2020 02/04/13 0500 02/04/13 1800 02/05/13 0545  AST 1395* 787* 458* 235* 137*  ALT 1693* 1417* 1180* 950* 780*  ALKPHOS 83 85 82 83 85  BILITOT 0.6 0.5 0.6 0.5 0.7  PROT 6.0 6.4 6.2 6.6 6.6  ALBUMIN 2.6* 2.8* 2.7* 2.9* 3.0*   No results found for this basename: LIPASE, AMYLASE,  in the last 168 hours  Recent Labs Lab 02/01/13 1946  AMMONIA 36    CBC:  Recent Labs Lab 02/01/13 1947 02/02/13 0550 02/03/13 0650  WBC 18.7* 11.0* 8.4  NEUTROABS 17.1*  --   --   HGB 15.0 12.8 12.8  HCT 44.2 38.5 37.8  MCV 93.8 92.8 91.7  PLT 348 233 199    Cardiac Enzymes:  Recent Labs Lab 02/01/13 1947 02/02/13 0159 02/03/13 0650  CKTOTAL 206*  --   --   TROPONINI <0.30 <0.30 <0.30   BNP (last 3 results) No results found for this basename: PROBNP,  in the last 8760  hours   CBG:  Recent Labs Lab 02/03/13 2349 02/04/13 0618 02/04/13 1137 02/04/13 1823 02/05/13 0609  GLUCAP 110* 97 102* 112* 95    Recent Results (from the past 240 hour(s))  URINE CULTURE     Status: None   Collection Time    02/01/13  8:02 PM  Result Value Range Status   Specimen Description URINE, CATHETERIZED   Final   Special Requests NONE   Final   Culture  Setup Time 02/02/2013 04:45   Final   Colony Count NO GROWTH   Final   Culture NO GROWTH   Final   Report Status 02/03/2013 FINAL   Final  MRSA PCR SCREENING     Status: None   Collection Time    02/02/13  1:25 AM      Result Value Range Status   MRSA by PCR NEGATIVE  NEGATIVE Final   Comment:            The GeneXpert MRSA Assay (FDA     approved for NASAL specimens     only), is one component of a     comprehensive MRSA colonization     surveillance program. It is not     intended to diagnose MRSA     infection nor to guide or     monitor treatment for     MRSA infections.     Studies: Dg Chest 1 View  02/01/2013  *RADIOLOGY REPORT*  Clinical Data: Shortness of breath.  CHEST - 1 VIEW  Comparison: PA and lateral chest 02/27/2006.  CT chest 02/24/2006.  Findings: Lung volumes are low.  There is focal airspace disease in the left mid lung zone.  Right lung appears grossly clear. Cardiomegaly is noted.  IMPRESSION:  1.  Focal airspace disease left mid lung zone worrisome for pneumonia. 2.  Cardiomegaly.   Original Report Authenticated By: Holley Dexter, M.D.    Ct Head Wo Contrast  02/01/2013  *RADIOLOGY REPORT*  Clinical Data: Neighbor found pt laying on ground next to bed, lethargic and unresponsive  CT HEAD WITHOUT CONTRAST  Technique:  Contiguous axial images were obtained from the base of the skull through the vertex without contrast.  Comparison: None.  Findings: No acute intracranial hemorrhage.  No focal mass lesion. No CT evidence of acute infarction.   No midline shift or mass effect.  No  hydrocephalus.  Basilar cisterns are patent.  Mild cortical atrophy.  Mastoid air cells are clear.  There is mild mucosal periosteal thickening in the maxillary sinuses.  IMPRESSION:  1.  No acute intracranial findings. 2.  Mild atrophy. 3.  Chronic sinus disease.   Original Report Authenticated By: Genevive Bi, M.D.    Ct Cervical Spine Wo Contrast  02/01/2013  *RADIOLOGY REPORT*  Clinical Data: Found on ground; lethargic and unresponsive. Concern for cervical spine injury.  CT CERVICAL SPINE WITHOUT CONTRAST  Technique:  Multidetector CT imaging of the cervical spine was performed. Multiplanar CT image reconstructions were also generated.  Comparison: None.  Findings: There is no evidence of fracture or subluxation. Vertebral bodies demonstrate normal height and alignment.  There is multilevel disc space narrowing along the cervical spine, with associated anterior and posterior disc osteophyte complexes and underlying facet disease.  Prevertebral soft tissues are within normal limits.  The thyroid gland is unremarkable in appearance.  Mild atelectasis is noted at the visualized lung apices.  No significant soft tissue abnormalities are seen.  Dense calcification is suggested along the right innominate artery.  IMPRESSION:  1.  No evidence of fracture or subluxation along the cervical spine. 2.  Mild degenerative change noted along the cervical spine. 3.  Mild atelectasis at the visualized lung apices.   Original Report Authenticated By: Tonia Ghent, M.D.    Ir Fluoro Guide Cv Line Right  02/03/2013  *  RADIOLOGY REPORT*  Indication: Poor venous access, concern for impending hepatic failure with coagulopathy, need for frequent lab checks and reliable intravenous access  ULTRASOUND AND FLUORSCOPIC GUIDED PICC LINE INSERTION  Intravenous Medications: None  Contrast: None  Fluoroscopy Time:  0.6 minutes.  Complications: None immediate  Technique / Findings:  The procedure, risks, benefits, and alternatives  were explained to the patient's family and informed written consent was obtained.  A timeout was performed prior to the initiation of the procedure.  The right upper extremity was prepped with chlorhexidine in a sterile fashion, and a sterile drape was applied covering the operative field.  Maximum barrier sterile technique with sterile gowns and gloves were used for the procedure.  A timeout was performed prior to the initiation of the procedure.  Local anesthesia was provided with 1% lidocaine.  Under direct ultrasound guidance, the rightbasilicvein was accessed with a micropuncture kit after the overlying soft tissues were anesthetized with 1% lidocaine.  An ultrasound image was saved for documentation purposes.  A guidewire was advanced to the level of the superior caval-atrial junction for measurement purposes and the PICC line was cut to length.  A peel-away sheath was placed and a 40 cm, 5 [place], dual lumen was inserted to level of the superior caval-atrial junction.  A post procedure spot fluoroscopic was obtained.  The catheter easily aspirated and flushed and was sutured in place.  A dressing was placed.  The patient tolerated the procedure well without immediate post procedural complication.  Impression:  Successful ultrasound and fluoroscopic guided placement of a right basilic vein approach, 40 cm, 5 French,dual lumen PICC with tip at the superior caval-atrial junction.  The PICC line is ready for immediate use.   Original Report Authenticated By: Tacey Ruiz, MD    Ir US Guide Vasc Access Right  02/03/2013  *RADIOLOGY REPORT*  Indication: Poor venous access, concern for impending hepatic failure with coagulopathy, need for frequent lab checks and reliable intravenous access  ULTRASOUND AND FLUORSCOPIC GUIDED PICC LINE INSERTION  Intravenous Medications: None  Contrast: None  Fluoroscopy Time:  0.6 minutes.  Complications: None immediate  Technique / Findings:  The procedure, risks, benefits, and  alternatives were explained to the patient's family and informed written consent was obtained.  A timeout was performed prior to the initiation of the procedure.  The right upper extremity was prepped with chlorhexidine in a sterile fashion, and a sterile drape was applied covering the operative field.  Maximum barrier sterile technique with sterile gowns and gloves were used for the procedure.  A timeout was performed prior to the initiation of the procedure.  Local anesthesia was provided with 1% lidocaine.  Under direct ultrasound guidance, the rightbasilicvein was accessed with a micropuncture kit after the overlying soft tissues were anesthetized with 1% lidocaine.  An ultrasound image was saved for documentation purposes.  A guidewire was advanced to the level of the superior caval-atrial junction for measurement purposes and the PICC line was cut to length.  A peel-away sheath was placed and a 40 cm, 5 [place], dual lumen was inserted to level of the superior caval-atrial junction.  A post procedure spot fluoroscopic was obtained.  The catheter easily aspirated and flushed and was sutured in place.  A dressing was placed.  The patient tolerated the procedure well without immediate post procedural complication.  Impression:  Successful ultrasound and fluoroscopic guided placement of a right basilic vein approach, 40 cm, 5 French,dual lumen PICC with tip at the  superior caval-atrial junction.  The PICC line is ready for immediate use.   Original Report Authenticated By: Tacey Ruiz, MD    Dg Chest Port 1 View  02/03/2013  *RADIOLOGY REPORT*  Clinical Data: Altered mental status.  PORTABLE CHEST - 1 VIEW  Comparison: Chest 02/02/2013.  Findings: Mild elevation of the right hemidiaphragm is again seen. There is some coarsening of the pulmonary interstitium.  No consolidative process, pneumothorax or effusion is identified. Cardiomegaly is noted.  IMPRESSION: No acute abnormality.   Original Report Authenticated  By: Holley Dexter, M.D.    Dg Chest Port 1 View  02/02/2013  *RADIOLOGY REPORT*  Clinical Data: Evaluate bilateral lower lobe infiltrate  PORTABLE CHEST - 1 VIEW  Comparison: 02/01/2013  Findings: Borderline cardiomegaly.  Significant improvement in aeration.  No acute infiltrate or pulmonary edema.  Bony thorax is stable.  IMPRESSION: Significant improved aeration without acute infiltrate or pulmonary edema.   Original Report Authenticated By: Natasha Mead, M.D.     Scheduled Meds: . acetylcysteine  150 mg/kg Intravenous Once  . folic acid  1 mg Oral Daily  . levothyroxine  50 mcg Oral QAC breakfast  . phosphorus  500 mg Oral TID  . sodium chloride  3 mL Intravenous Q12H  . thiamine  100 mg Oral Daily   Continuous Infusions: . sodium chloride 20 mL/hr at 02/04/13 1136    Principal Problem:   Tylenol overdose Active Problems:   Suicidal ideation   Depression   Hypothyroidism   Asthma    Time spent: 40 minutes   [facility]  [facility] Pager [phone]. If 8PM-8AM, please contact night-coverage at [email or web address], password [email or web address] 02/05/2013, 12:23 PM  LOS: 4 days

## 2013-02-05 NOTE — Progress Notes (Signed)
PULMONARY  / CRITICAL CARE MEDICINE  Name: Elaine Byrd MRN: [id number] DOB: [birth year]    ADMISSION DATE:  02/01/2013 CONSULTATION DATE:  02/02/13   REFERRING MD :  Sharyne Peach  CHIEF COMPLAINT:  Tylenol OD  BRIEF PATIENT DESCRIPTION: 18, hx depression, hypothyroid, ? Asthma. Admitted 3/6 pm after intentional ingestion benzos, narcs, tylenol (level=272). Started n-AC approx 23:30 3/6. PCCM consulted 3/7.   SIGNIFICANT EVENTS / STUDIES:  MELD 3/7 am >> 10  LINES / TUBES: none  CULTURES: Urine 3/6 >> Not collected  ANTIBIOTICS: levaquin 3/6 >> single dose   BRIEF  67 yo former tobacco, current moderate EtOH, hx depression, ? Asthma. Found down by family 3/6 pm, poorly responsive, lethargic. They noted empty vicodin, clonazepam bottles that had been filled x 1 week. Initial tylenol level 273 (at ~4h time point), MELD 10. More oriented 3/7 am and able to tell me that she took vicodin, benzos and a "large part" of a large, regular strength tylenol bottle. # pills unclear. IV n-AC was started approx midnight 02/02/13.   EVENTS 02/05/13: Denies complaints. Sitter at bedside. [facility] says we can sign off  VITAL SIGNS: Temp:  [98 F (36.7 C)-98.8 F (37.1 C)] 98.8 F (37.1 C) (03/10 0500) Pulse Rate:  [92-104] 103 (03/10 0500) Resp:  [15-18] 16 (03/10 0500) BP: (137-161)/(81-84) 137/81 mmHg (03/10 0500) SpO2:  [92 %-97 %] 92 % (03/10 0500) HEMODYNAMICS:  INTAKE / OUTPUT: Intake/Output     03/09 0701 - 03/10 0700 03/10 0701 - 03/11 0700   P.O. 590    I.V. (mL/kg) 381 (4.1) 160 (1.7)   IV Piggyback     Total Intake(mL/kg) 971 (10.4) 160 (1.7)   Total Output 0     Net +971 +160        Urine Occurrence 5 x    Stool Occurrence 2 x      PHYSICAL EXAMINATION: General:  Obese woman, comfortable Neuro:  RASS 0. GCS 15 . Moves all 4s. No asterixis. ? Mild confusion.  HEENT:  OP moist, ruddy faces, large neck, no stridor Cardiovascular:  Regu,ar no M Lungs:  Decreased at bases,  no wheeze or crackles Abdomen:  Obese, soft, benign Musculoskeletal:  No edema Skin:  No rash  LABS:  Recent Labs Lab 02/01/13 1947 02/02/13 0159 02/02/13 0208 02/02/13 0550  02/03/13 0650  02/03/13 2020 02/04/13 0500 02/04/13 1200 02/04/13 1800 02/05/13 0545  HGB 15.0  --   --  12.8  --  12.8  --   --   --   --   --   --   WBC 18.7*  --   --  11.0*  --  8.4  --   --   --   --   --   --   PLT 348  --   --  233  --  199  --   --   --   --   --   --   NA 137  --   --  140  < > 141  < > 141 143  --  142 144  K 4.2  --   --  3.0*  < > 3.7  < > 4.2 3.8  --  3.7 3.5  CL 98  --   --  103  < > 109  < > 108 108  --  106 108  CO2 20  --   --  23  < > 19  < > 22 25  --  27 28  GLUCOSE 108*  --   --  86  < > 100*  < > 99 106*  --  102* 95  BUN 26*  --   --  25*  < > 8  < > 3* 3*  --  3* 3*  CREATININE 0.87  --   --  0.62  < > 0.50  < > 0.44* 0.40*  --  0.53 0.53  CALCIUM 9.5  --   --  8.8  < > 9.1  < > 8.9 9.0  --  9.1 9.1  MG  --   --   --   --   < > 1.7  --  1.6 1.7  --   --   --   PHOS  --   --   --   --   < > 0.6*  --  2.3 2.1*  --  3.8 3.1  AST 308*  --   --  499*  < > 2027*  < > 787* 458*  --  235* 137*  ALT 247*  --   --  403*  < > 2028*  < > 1417* 1180*  --  950* 780*  ALKPHOS 126*  --   --  96  < > 94  < > 85 82  --  83 85  BILITOT 0.8  --   --  0.9  < > 0.6  < > 0.5 0.6  --  0.5 0.7  PROT 8.3  --   --  6.7  < > 6.4  < > 6.4 6.2  --  6.6 6.6  ALBUMIN 4.1  --   --  3.2*  < > 2.9*  < > 2.8* 2.7*  --  2.9* 3.0*  INR 1.17  --   --  1.32  < >  --   < >  --  1.18 1.18  --  0.99  TROPONINI <0.30 <0.30  --   --   --  <0.30  --   --   --   --   --   --   PHART  --   --  7.331*  --   --   --   --   --   --   --   --   --   PCO2ART  --   --  42.3  --   --   --   --   --   --   --   --   --   PO2ART  --   --  69.0*  --   --   --   --   --   --   --   --   --   < > = values in this interval not displayed. Tylenol 4h >> 273 Tylenol 13h >> 94  MELD 3/7 am >> 10   Recent Labs Lab  02/03/13 2349 02/04/13 0618 02/04/13 1137 02/04/13 1823 02/05/13 0609  GLUCAP 110* 97 102* 112* 95    CXR: 3/6>> cardiomegaly, small volumes, significant atx vs infiltrate B LL, L > R CXR 3/8>> Findings: Mild elevation of the right hemidiaphragm is again seen. There is some coarsening of the pulmonary interstitium. No consolidative process, pneumothorax or effusion is identified. Cardiomegaly is noted.  IMPRESSION:  No acute abnormality.   ASSESSMENT / PLAN:  PULMONARY A:B basilar atx vs infiltrate ? Hx asthma 02/05/13: stable resp status P:   -monitor   CARDIOVASCULAR A: hemodynamically stable  P:  monitor  RENAL A:  High risk acute renal failure if evolves hepatic failure P:   - follow S Cr and lytes - hold home lasix  GASTROINTESTINAL A:  Tylenol OD, intentional. Initial MELD 10. n-AC started 3/7 at midnight  Recent Labs Lab 02/03/13 1116 02/03/13 2020 02/04/13 0500 02/04/13 1800 02/05/13 0545  CREATININE 0.52 0.44* 0.40* 0.53 0.53     Recent Labs Lab 02/02/13 2337  02/03/13 1116 02/03/13 2020 02/04/13 0500 02/04/13 1200 02/04/13 1800 02/05/13 0545  AST 2918*  < > 1395* 787* 458*  --  235* 137*  ALT 2195*  < > 1693* 1417* 1180*  --  950* 780*  ALKPHOS 91  < > 83 85 82  --  83 85  BILITOT 0.4  < > 0.6 0.5 0.6  --  0.5 0.7  PROT 6.5  < > 6.0 6.4 6.2  --  6.6 6.6  ALBUMIN 2.9*  < > 2.6* 2.8* 2.7*  --  2.9* 3.0*  INR 1.63*  --  1.48  --  1.18 1.18  --  0.99  < > = values in this interval not displayed.   Recent Labs Lab 02/02/13 2337 02/03/13 1116 02/04/13 0500 02/04/13 1200 02/05/13 0545  INR 1.63* 1.48 1.18 1.18 0.99    - 02/05/13: MELD score 6 and better P:   MELD score better Mgmt per [facility] as outlined before  HEMATOLOGIC A:  leukocytosis P:  - follow CBC and clinical status - repeat CXR, consider abx   INFECTIOUS A:  At risk aspiration P:   - repeat CXR w/o infiltrate  ENDOCRINE A:  hypothyroidism   P:   - Start  Synthroid IV until we are sure she is safe for PO's  NEUROLOGIC A: lethargy, multifactorial due to meds. At high risk for hepatic encephalopathy - 02/05/13: improved but still needing sitter P:   - follow neuro status closely - hold home abilify, clonazepam, cymbalta - will need psych consult,     PCCM will sign off. D/w [facility] Dr Susie Cassette    Dr. Kalman Shan, M.D., [facility].C.P Pulmonary and Critical Care Medicine Staff Physician [facility] Pager: [phone], If no answer or between  15:00h - 7:00h: call [phone]  02/05/2013 12:23 PM

## 2013-02-06 DIAGNOSIS — T391X1A Poisoning by 4-Aminophenol derivatives, accidental (unintentional), initial encounter: Secondary | ICD-10-CM

## 2013-02-06 LAB — COMPREHENSIVE METABOLIC PANEL
ALT: 501 U/L — ABNORMAL HIGH (ref 0–35)
BUN: 6 mg/dL (ref 6–23)
Calcium: 9.2 mg/dL (ref 8.4–10.5)
GFR calc Af Amer: >90 mL/min (ref >90)
Glucose, Bld: 92 mg/dL (ref 70–99)
Sodium: 142 mEq/L (ref 135–145)
Total Protein: 6.6 g/dL (ref 6.0–8.3)

## 2013-02-06 LAB — GLUCOSE, CAPILLARY: Glucose-Capillary: 94 mg/dL (ref 70–99)

## 2013-02-06 MED ORDER — LEVOTHYROXINE SODIUM 50 MCG PO TABS
50.0000 ug | ORAL_TABLET | Freq: Every day | ORAL | Status: DC
  Filled 2013-02-06: qty 1

## 2013-02-06 MED ORDER — ARIPIPRAZOLE 2 MG PO TABS
2.0000 mg | ORAL_TABLET | Freq: Every day | ORAL | Status: DC
  Filled 2013-02-06: qty 1

## 2013-02-06 MED ORDER — HYDROCODONE-ACETAMINOPHEN 5-325 MG PO TABS: 1.0000 | ORAL_TABLET | Freq: Four times a day (QID) | ORAL | Status: DC | PRN

## 2013-02-06 MED ORDER — CLONAZEPAM 0.5 MG PO TABS: 0.5000 mg | ORAL_TABLET | Freq: Three times a day (TID) | ORAL | Status: DC | PRN

## 2013-02-06 MED ORDER — DULOXETINE HCL 60 MG PO CPEP
60.0000 mg | ORAL_CAPSULE | Freq: Every day | ORAL | Status: DC
  Administered 2013-02-06: 60 mg via ORAL
  Filled 2013-02-06: qty 1

## 2013-02-06 NOTE — Progress Notes (Signed)
Spoke with patient again this morning to further assess and check in with her on how she is feeling- Patient reports that she is feeling better today- states, "I can tell the meds are flushing out as  I am beginning to feel my pain and depression come back." Patient denies SI/HI thoughts but does feel that she is missing her anti-depressant/pain meds- will message MD. Patient continues to feel fortunate that she did not die after her overdose- states "God isn't finished with me yet."  She appears very high-spirited today- more so than yesterday- giggling, etc. Will ask Dr. Ferol Luz to revisit patient for further assessment and recommendations-  Reece Levy, MSW, [employer] [phone]
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
## 2013-02-06 NOTE — Progress Notes (Signed)
Pt HR increased to 140s-150s ST. Pt reports being very upset due to a bank account that she cant take care of while here in the hospital. After talking with the patient, HR returned to low 100s. Pt denies palpitations, dizziness or lightheadedness. Levonne Spiller, RN

## 2013-02-06 NOTE — Progress Notes (Signed)
Patient given post PICC removal care instructions via teach back method.  Elaine Byrd, Elaine Byrd

## 2013-02-06 NOTE — Progress Notes (Signed)
Pt provided with dc instructions and education. Verbalized understnading. Pt aware of appointments that should be scheduled within the next few weeks. Education provided to daughter, Lynwood Dawley, via phone. All questions answered.Heart monitor cleaned and returned to front. Ilona Sorrel, RN

## 2013-02-06 NOTE — Discharge Summary (Signed)
Physician Discharge Summary  Elaine Byrd MRN: [id number] DOB/AGE: 1946-01-12 67 y.o.  PCP: Ron Parker, MD   Admit date: 02/01/2013 Discharge date: 02/06/2013  Discharge Diagnoses:   :   Tylenol overdose Active Problems:   Suicidal ideation   Depression   Hypothyroidism   Asthma     Medication List    TAKE these medications       albuterol 108 (90 BASE) MCG/ACT inhaler  Commonly known as:  PROVENTIL HFA;VENTOLIN HFA  Inhale 2 puffs into the lungs every 6 (six) hours as needed for wheezing. For wheezing     ARIPiprazole 2 MG tablet  Commonly known as:  ABILIFY  Take 2 mg by mouth at bedtime.     budesonide-formoterol 160-4.5 MCG/ACT inhaler  Commonly known as:  SYMBICORT  Inhale 2 puffs into the lungs 2 (two) times daily.    v    DULoxetine 60 MG capsule  Commonly known as:  CYMBALTA  Take 60 mg by mouth daily.     fluticasone-salmeterol 115-21 MCG/ACT inhaler  Commonly known as:  ADVAIR HFA  Inhale 2 puffs into the lungs 2 (two) times daily.     furosemide 20 MG tablet  Commonly known as:  LASIX  Take 20 mg by mouth every morning.     HYDROcodone-acetaminophen 5-325 MG per tablet  Commonly known as:  NORCO/VICODIN  Take 1 tablet by mouth every 8 (eight) hours as needed for pain. For pain     levothyroxine 50 MCG tablet  Commonly known as:  SYNTHROID, LEVOTHROID  Take 50 mcg by mouth daily.     potassium chloride 10 MEQ CR tablet  Commonly known as:  KLOR-CON  Take 10 mEq by mouth every morning.     zafirlukast 20 MG tablet  Commonly known as:  ACCOLATE  Take 20 mg by mouth 2 (two) times daily.        Discharge Condition: stable  Disposition:    Consults: stable   Significant Diagnostic Studies: Dg Chest 1 View  02/01/2013  *RADIOLOGY REPORT*  Clinical Data: Shortness of breath.  CHEST - 1 VIEW  Comparison: PA and lateral chest 02/27/2006.  CT chest 02/24/2006.  Findings: Lung volumes are low.  There is focal airspace disease in  the left mid lung zone.  Right lung appears grossly clear. Cardiomegaly is noted.  IMPRESSION:  1.  Focal airspace disease left mid lung zone worrisome for pneumonia. 2.  Cardiomegaly.   Original Report Authenticated By: Holley Dexter, M.D.    Ct Head Wo Contrast  02/01/2013  *RADIOLOGY REPORT*  Clinical Data: Neighbor found pt laying on ground next to bed, lethargic and unresponsive  CT HEAD WITHOUT CONTRAST  Technique:  Contiguous axial images were obtained from the base of the skull through the vertex without contrast.  Comparison: None.  Findings: No acute intracranial hemorrhage.  No focal mass lesion. No CT evidence of acute infarction.   No midline shift or mass effect.  No hydrocephalus.  Basilar cisterns are patent.  Mild cortical atrophy.  Mastoid air cells are clear.  There is mild mucosal periosteal thickening in the maxillary sinuses.  IMPRESSION:  1.  No acute intracranial findings. 2.  Mild atrophy. 3.  Chronic sinus disease.   Original Report Authenticated By: Genevive Bi, M.D.    Ct Cervical Spine Wo Contrast  02/01/2013  *RADIOLOGY REPORT*  Clinical Data: Found on ground; lethargic and unresponsive. Concern for cervical spine injury.  CT CERVICAL SPINE WITHOUT CONTRAST  Technique:  Multidetector CT imaging of the cervical spine was performed. Multiplanar CT image reconstructions were also generated.  Comparison: None.  Findings: There is no evidence of fracture or subluxation. Vertebral bodies demonstrate normal height and alignment.  There is multilevel disc space narrowing along the cervical spine, with associated anterior and posterior disc osteophyte complexes and underlying facet disease.  Prevertebral soft tissues are within normal limits.  The thyroid gland is unremarkable in appearance.  Mild atelectasis is noted at the visualized lung apices.  No significant soft tissue abnormalities are seen.  Dense calcification is suggested along the right innominate artery.  IMPRESSION:  1.   No evidence of fracture or subluxation along the cervical spine. 2.  Mild degenerative change noted along the cervical spine. 3.  Mild atelectasis at the visualized lung apices.   Original Report Authenticated By: Tonia Ghent, M.D.    Ir Fluoro Guide Cv Line Right  02/03/2013  *RADIOLOGY REPORT*  Indication: Poor venous access, concern for impending hepatic failure with coagulopathy, need for frequent lab checks and reliable intravenous access  ULTRASOUND AND FLUORSCOPIC GUIDED PICC LINE INSERTION  Intravenous Medications: None  Contrast: None  Fluoroscopy Time:  0.6 minutes.  Complications: None immediate  Technique / Findings:  The procedure, risks, benefits, and alternatives were explained to the patient's family and informed written consent was obtained.  A timeout was performed prior to the initiation of the procedure.  The right upper extremity was prepped with chlorhexidine in a sterile fashion, and a sterile drape was applied covering the operative field.  Maximum barrier sterile technique with sterile gowns and gloves were used for the procedure.  A timeout was performed prior to the initiation of the procedure.  Local anesthesia was provided with 1% lidocaine.  Under direct ultrasound guidance, the rightbasilicvein was accessed with a micropuncture kit after the overlying soft tissues were anesthetized with 1% lidocaine.  An ultrasound image was saved for documentation purposes.  A guidewire was advanced to the level of the superior caval-atrial junction for measurement purposes and the PICC line was cut to length.  A peel-away sheath was placed and a 40 cm, 5 [place], dual lumen was inserted to level of the superior caval-atrial junction.  A post procedure spot fluoroscopic was obtained.  The catheter easily aspirated and flushed and was sutured in place.  A dressing was placed.  The patient tolerated the procedure well without immediate post procedural complication.  Impression:  Successful ultrasound  and fluoroscopic guided placement of a right basilic vein approach, 40 cm, 5 French,dual lumen PICC with tip at the superior caval-atrial junction.  The PICC line is ready for immediate use.   Original Report Authenticated By: Tacey Ruiz, MD    Ir US Guide Vasc Access Right  02/03/2013  *RADIOLOGY REPORT*  Indication: Poor venous access, concern for impending hepatic failure with coagulopathy, need for frequent lab checks and reliable intravenous access  ULTRASOUND AND FLUORSCOPIC GUIDED PICC LINE INSERTION  Intravenous Medications: None  Contrast: None  Fluoroscopy Time:  0.6 minutes.  Complications: None immediate  Technique / Findings:  The procedure, risks, benefits, and alternatives were explained to the patient's family and informed written consent was obtained.  A timeout was performed prior to the initiation of the procedure.  The right upper extremity was prepped with chlorhexidine in a sterile fashion, and a sterile drape was applied covering the operative field.  Maximum barrier sterile technique with sterile gowns and gloves were used for the procedure.  A timeout  was performed prior to the initiation of the procedure.  Local anesthesia was provided with 1% lidocaine.  Under direct ultrasound guidance, the rightbasilicvein was accessed with a micropuncture kit after the overlying soft tissues were anesthetized with 1% lidocaine.  An ultrasound image was saved for documentation purposes.  A guidewire was advanced to the level of the superior caval-atrial junction for measurement purposes and the PICC line was cut to length.  A peel-away sheath was placed and a 40 cm, 5 [place], dual lumen was inserted to level of the superior caval-atrial junction.  A post procedure spot fluoroscopic was obtained.  The catheter easily aspirated and flushed and was sutured in place.  A dressing was placed.  The patient tolerated the procedure well without immediate post procedural complication.  Impression:  Successful  ultrasound and fluoroscopic guided placement of a right basilic vein approach, 40 cm, 5 French,dual lumen PICC with tip at the superior caval-atrial junction.  The PICC line is ready for immediate use.   Original Report Authenticated By: Tacey Ruiz, MD    Dg Chest Port 1 View  02/03/2013  *RADIOLOGY REPORT*  Clinical Data: Altered mental status.  PORTABLE CHEST - 1 VIEW  Comparison: Chest 02/02/2013.  Findings: Mild elevation of the right hemidiaphragm is again seen. There is some coarsening of the pulmonary interstitium.  No consolidative process, pneumothorax or effusion is identified. Cardiomegaly is noted.  IMPRESSION: No acute abnormality.   Original Report Authenticated By: Holley Dexter, M.D.    Dg Chest Port 1 View  02/02/2013  *RADIOLOGY REPORT*  Clinical Data: Evaluate bilateral lower lobe infiltrate  PORTABLE CHEST - 1 VIEW  Comparison: 02/01/2013  Findings: Borderline cardiomegaly.  Significant improvement in aeration.  No acute infiltrate or pulmonary edema.  Bony thorax is stable.  IMPRESSION: Significant improved aeration without acute infiltrate or pulmonary edema.   Original Report Authenticated By: Natasha Mead, M.D.      Microbiology: Recent Results (from the past 240 hour(s))  URINE CULTURE     Status: None   Collection Time    02/01/13  8:02 PM      Result Value Range Status   Specimen Description URINE, CATHETERIZED   Final   Special Requests NONE   Final   Culture  Setup Time 02/02/2013 04:45   Final   Colony Count NO GROWTH   Final   Culture NO GROWTH   Final   Report Status 02/03/2013 FINAL   Final  MRSA PCR SCREENING     Status: None   Collection Time    02/02/13  1:25 AM      Result Value Range Status   MRSA by PCR NEGATIVE  NEGATIVE Final   Comment:            The GeneXpert MRSA Assay (FDA     approved for NASAL specimens     only), is one component of a     comprehensive MRSA colonization     surveillance program. It is not     intended to diagnose MRSA      infection nor to guide or     monitor treatment for     MRSA infections.     Labs: Results for orders placed during the hospital encounter of 02/01/13 (from the past 48 hour(s))  PHOSPHORUS     Status: None   Collection Time    02/04/13  6:00 PM      Result Value Range   Phosphorus 3.8  2.3 - 4.6  mg/dL  BASIC METABOLIC PANEL     Status: Abnormal   Collection Time    02/04/13  6:00 PM      Result Value Range   Sodium 142  135 - 145 mEq/L   Potassium 3.7  3.5 - 5.1 mEq/L   Chloride 106  96 - 112 mEq/L   CO2 27  19 - 32 mEq/L   Glucose, Bld 102 (*) 70 - 99 mg/dL   BUN 3 (*) 6 - 23 mg/dL   Creatinine, Ser 4.09  0.50 - 1.10 mg/dL   Calcium 9.1  8.4 - 81.1 mg/dL   GFR calc non Af Amer >90  >90 mL/min   GFR calc Af Amer >90  >90 mL/min   Comment:            The eGFR has been calculated     using the CKD EPI equation.     This calculation has not been     validated in all clinical     situations.     eGFR's persistently     <90 mL/min signify     possible Chronic Kidney Disease.  HEPATIC FUNCTION PANEL     Status: Abnormal   Collection Time    02/04/13  6:00 PM      Result Value Range   Total Protein 6.6  6.0 - 8.3 g/dL   Albumin 2.9 (*) 3.5 - 5.2 g/dL   AST 914 (*) 0 - 37 U/L   ALT 950 (*) 0 - 35 U/L   Alkaline Phosphatase 83  39 - 117 U/L   Total Bilirubin 0.5  0.3 - 1.2 mg/dL   Bilirubin, Direct 0.2  0.0 - 0.3 mg/dL   Indirect Bilirubin 0.3  0.3 - 0.9 mg/dL  GLUCOSE, CAPILLARY     Status: Abnormal   Collection Time    02/04/13  6:23 PM      Result Value Range   Glucose-Capillary 112 (*) 70 - 99 mg/dL  PHOSPHORUS     Status: None   Collection Time    02/05/13  5:45 AM      Result Value Range   Phosphorus 3.1  2.3 - 4.6 mg/dL  BASIC METABOLIC PANEL     Status: Abnormal   Collection Time    02/05/13  5:45 AM      Result Value Range   Sodium 144  135 - 145 mEq/L   Potassium 3.5  3.5 - 5.1 mEq/L   Chloride 108  96 - 112 mEq/L   CO2 28  19 - 32 mEq/L    Glucose, Bld 95  70 - 99 mg/dL   BUN 3 (*) 6 - 23 mg/dL   Creatinine, Ser 7.82  0.50 - 1.10 mg/dL   Calcium 9.1  8.4 - 95.6 mg/dL   GFR calc non Af Amer >90  >90 mL/min   GFR calc Af Amer >90  >90 mL/min   Comment:            The eGFR has been calculated     using the CKD EPI equation.     This calculation has not been     validated in all clinical     situations.     eGFR's persistently     <90 mL/min signify     possible Chronic Kidney Disease.  HEPATIC FUNCTION PANEL     Status: Abnormal   Collection Time    02/05/13  5:45 AM      Result Value Range  Total Protein 6.6  6.0 - 8.3 g/dL   Albumin 3.0 (*) 3.5 - 5.2 g/dL   AST 621 (*) 0 - 37 U/L   ALT 780 (*) 0 - 35 U/L   Alkaline Phosphatase 85  39 - 117 U/L   Total Bilirubin 0.7  0.3 - 1.2 mg/dL   Bilirubin, Direct 0.2  0.0 - 0.3 mg/dL   Indirect Bilirubin 0.5  0.3 - 0.9 mg/dL  PROTIME-INR     Status: None   Collection Time    02/05/13  5:45 AM      Result Value Range   Prothrombin Time 13.0  11.6 - 15.2 seconds   INR 0.99  0.00 - 1.49  GLUCOSE, CAPILLARY     Status: None   Collection Time    02/05/13  6:09 AM      Result Value Range   Glucose-Capillary 95  70 - 99 mg/dL  GLUCOSE, CAPILLARY     Status: Abnormal   Collection Time    02/05/13 11:56 AM      Result Value Range   Glucose-Capillary 100 (*) 70 - 99 mg/dL  GLUCOSE, CAPILLARY     Status: Abnormal   Collection Time    02/05/13  6:53 PM      Result Value Range   Glucose-Capillary 109 (*) 70 - 99 mg/dL  GLUCOSE, CAPILLARY     Status: None   Collection Time    02/06/13 12:16 AM      Result Value Range   Glucose-Capillary 94  70 - 99 mg/dL   Comment 1 Notify RN    COMPREHENSIVE METABOLIC PANEL     Status: Abnormal   Collection Time    02/06/13  5:00 AM      Result Value Range   Sodium 142  135 - 145 mEq/L   Potassium 3.2 (*) 3.5 - 5.1 mEq/L   Chloride 106  96 - 112 mEq/L   CO2 26  19 - 32 mEq/L   Glucose, Bld 92  70 - 99 mg/dL   BUN 6  6 - 23 mg/dL    Creatinine, Ser 3.08  0.50 - 1.10 mg/dL   Calcium 9.2  8.4 - 65.7 mg/dL   Total Protein 6.6  6.0 - 8.3 g/dL   Albumin 2.9 (*) 3.5 - 5.2 g/dL   AST 56 (*) 0 - 37 U/L   ALT 501 (*) 0 - 35 U/L   Alkaline Phosphatase 76  39 - 117 U/L   Total Bilirubin 0.5  0.3 - 1.2 mg/dL   GFR calc non Af Amer >90  >90 mL/min   GFR calc Af Amer >90  >90 mL/min   Comment:            The eGFR has been calculated     using the CKD EPI equation.     This calculation has not been     validated in all clinical     situations.     eGFR's persistently     <90 mL/min signify     possible Chronic Kidney Disease.  GLUCOSE, CAPILLARY     Status: Abnormal   Collection Time    02/06/13  6:17 AM      Result Value Range   Glucose-Capillary 103 (*) 70 - 99 mg/dL  GLUCOSE, CAPILLARY     Status: None   Collection Time    02/06/13 11:49 AM      Result Value Range   Glucose-Capillary 92  70 - 99 mg/dL  Brief narrative:  66 y.o. female with history of hypothyroidism asthma and depression who was brought to the ER after being found on the floor lethargic. As per patient's son and daughter who provided the history they had attempted to contact the patient and when the patient was not answering her phone they called the police. When the police first went to the house the pt was stating she had taken some NyQuil and was drowsy. The following day they tried to reach her over the phone again - when there was no answer her son went directly to check on her at her house when she was found to be on the floor lethargic. Her Vicodin bottles and Klonopin bottles were empty - both were refilled a week previously. In the ER patient was found to have elevated LFTs normal INR and elevated Tylenol level. Patient was admitted for Tylenol overdose with suicidal ideation.  The pt underwent tx w/ a continuous mucomyst IV gtt. Electrolytes have been followed closely, and replaced as needed. ALT, INR, and APAP levels have been trended. As of  02/04/2013 the patient is exhibiting clear improvement, and is being transitioned off her mucomyst gtt. Psychiatry is following to address her SI.  Assessment/Plan:  Apparent intentional tylenol OD  initial level 273 ~4hrs post ingestion - ALT already elevated at time of presentation - treated with mucomyst IV -   Mucomyst stopped  per Poison Control as of 3/9 at 3am - ALT improved markedly, APAP is undetectable, and INR is <2.0 -  Stopped  mucomyst LFT's improving   , INR,improving  Apparent intentional narcotic and benzo OD  Pt is now alert and conversant - clinically she appears to have survived her narcotic and benzo overdose without lasting effect - will avoid use of narcotics or benzos    Suicidal Ideation w/ hx of depression  Pt freely admited to Dr. Sharon Seller that she was trying to kill herself - Psychiatry has been consulted - suicide precautions and sitter until otherwise stated by Psych MD  hold home abilify, clonazepam, cymbalta  Pt has capacity may start Cymbalta and Abilify   Will contiue to hold xanax  Psychiatry Suggests DC sitter, pt denies intent for self harm  Pt request outpatient therapist for family: pt, son & daughter, to meet for family therapy  No furhter psychiatric needs identified MD Psychiatrist signs off.    Bibasilar infiltrates  Was tx w/ a single dose of levaquin - high risk for aspiration  CXR suggest no lasting infiltrate and clinically the patient is stable - no indication for continued abx tx    Hypokalemia  Replaced and stable at this time   Hypophosphatemia - critical  Continue maintenance oral dosing and supplement with IV dose as needed when level is less than 1.0   Hypothyroidism  Continue Synthroid therapy   Hx of EtOH abuse - now moderate intake  Patient has a history of heavy alcohol intake - the family has reported that she now limits herself to approximately 2 drinks per week - no evidence of withdrawal         Discharge Exam:   Blood pressure 106/68, pulse 87, temperature 98.6 F (37 C), temperature source Oral, resp. rate 18, height 5\' 3"  (1.6 m), weight 87.816 kg (193 lb 9.6 oz), SpO2 91.00%.   General: Obese woman, comfortable  Neuro: Awake, slightly lethargic, moves all ext  HEENT: OP moist, ruddy faces, large neck, no stridor  Cardiovascular: Regu,ar no M  Lungs: Decreased at  bases, no wheeze or crackles  Abdomen: Obese, soft, benign  Musculoskeletal: No edema  Skin: No rash           Discharge Orders   Future Orders Complete By Expires     Diet - low sodium heart healthy  As directed     Increase activity slowly  As directed        Follow-up Information   Schedule an appointment as soon as possible for a visit with Ron Parker, MD.   Contact information:   [address] [place] [address] [phone]       Signed: Richarda Overlie 02/06/2013, 2:09 PM

## 2014-07-26 ENCOUNTER — Ambulatory Visit (INDEPENDENT_AMBULATORY_CARE_PROVIDER_SITE_OTHER): Payer: 59 | Admitting: Psychiatry

## 2014-07-26 DIAGNOSIS — F329 Major depressive disorder, single episode, unspecified: Secondary | ICD-10-CM

## 2014-07-26 DIAGNOSIS — F332 Major depressive disorder, recurrent severe without psychotic features: Secondary | ICD-10-CM

## 2014-07-26 MED ORDER — CLONAZEPAM 1 MG PO TABS: ORAL_TABLET | ORAL | Status: DC

## 2014-07-26 MED ORDER — DULOXETINE HCL 60 MG PO CPEP: 60.0000 mg | ORAL_CAPSULE | Freq: Every day | ORAL | Status: DC

## 2014-07-26 MED ORDER — ARIPIPRAZOLE 2 MG PO TABS: 2.0000 mg | ORAL_TABLET | Freq: Every day | ORAL | Status: DC

## 2014-07-26 NOTE — Progress Notes (Signed)
Psychiatric Assessment Adult  Patient Identification:  Elaine Byrd Date of Evaluation:  07/26/2014 Chief Complaint: Need a new provider This middle-aged white divorced mother carries a diagnosis of major depression. Over the last year or 2 her primary care Dr. added Abilify to her Cymbalta and was very helpful. Unfortunately her primary care doctor has had to switch and she seen a new physician. For reasons that are not clear that physician is uncomfortable with prescribing Abilify to this patient. At this time this patient is doing very well. She denies daily depression. She does describe ongoing frustrations with her adopted grandson, age 14 was living with her temporarily. But she clearly knowledge is that she is upset and frustrated but not depressed. The patient sleeps poorly but is a chronic problem for years. She goes to bed at a reasonable time but wakes up early because of pain in her legs. Patient has peripheral neuropathy and takes a pain medicine for it. The patient acknowledges that she did have pain in the morning she would sleep later. The patient is eating well. She's got good energy and can concentrate without problems. She is a good sense of worth. The patient enjoys reading, knitting watching TV going to the movies and her pets include a dog and a snake. The patient denies being suicidal at this time. However the urine half ago when she was very frustrated with life she took a significant overdose of medications. She was never taken to care at that time. I suspect it was at this time when the Abilify was added and the patient started doing much better. At this time the patient does not drink alcohol. However in 2011 she went for 2 years drinking a bottle of wine every day. Was also around that time but she was so depressed she met criteria for major depression. From 2009 the year 2011 she persistent depression with disturbances in her appetite and energy. In 2011 she was psychiatrically  hospitalized. She was not suicidal at that time. The year 2000 she saw local psychiatrist was diagnosed with bipolar disorder. She's [place] Depakote and Lamictal without success. A close assessment is patient denies any evidence of ever having mania. Therefore I do not believe she is bipolar disorder. The patient denies the use of illicit drugs. The patient denies hallucinations delusions ever. The patient denies specific anxiety symptoms of generalized anxiety disorder panic disorder or obsessive-compulsive disorder. The patient has been divorced since 1996. She presently is not in a relationship. She has 2 children one who is gay and has been in a relationship for long period of time. They did adopt a son whose name is Soil scientist. Rinade this time is 67 years of age and is living with the patient temporally. Her other daughter is married with 3 children. The patient claims her biggest stress at this time is financially. She is living on [address]. The patient has had no recent deaths of anyone was close to her. This patient does not smoke. She has chronic pain of a peripheral neuropathy of unknown etiology. The patient takes hydrocodone for this condition. The patient also has asthma it is only fairly controlled. The patient says she's been on multiple antidepressants. She describes the phenomenon of a good response and then over months or years the medication family. This indicates with Wellbutrin, Prozac, Paxil, Lexapro. The patient has never been on Effexor. She's never been on some of the newer antidepressants. Nonetheless at this time the patient is doing well on  Cymbalta and Abilify. It should be noted this patient has never been in psychotherapy. History of Chief Complaint:  No chief complaint on file.   HPI Review of Systems Physical Exam  Depressive Symptoms: insomnia,  (Hypo) Manic Symptoms:   Elevated Mood:  No Irritable Mood:  No Grandiosity:  No Distractibility:  No Labiality of  Mood:  No Delusions:  No Hallucinations:  No Impulsivity:  No Sexually Inappropriate Behavior:  No Financial Extravagance:  No Flight of Ideas:  No  Anxiety Symptoms: Excessive Worry:  No Panic Symptoms:  No Agoraphobia:  No Obsessive Compulsive: No  Symptoms: None, Specific Phobias:  No Social Anxiety:  No  Psychotic Symptoms:  Hallucinations: No None Delusions:  No Paranoia:  No   Ideas of Reference:   PTSD Symptoms: Ever had a traumatic exposure:  No Had a traumatic exposure in the last month:  No Re-experiencing: No None Hypervigilance:  No Hyperarousal: No None Avoidance: No None  Traumatic Brain Injury: No   Past Psychiatric History: Diagnosis: Major depression   Hospitalizations: 2x1 in 2011 at [place]:   Substance Abuse Care:   Self-Mutilation:   Suicidal Attempts: 1 x  Violent Behaviors:    Past Medical History:   Past Medical History  Diagnosis Date  . Depression   . Asthma   . Hypothyroidism    History of Loss of Consciousness:   Seizure History:  No Cardiac History:  No Allergies:   Allergies  Allergen Reactions  . Morphine And Related Other (See Comments)    hallucinations  . Penicillins Other (See Comments)    Childhood reaction, cannot remember  . Sulfa Antibiotics Other (See Comments)    itchiness   Current Medications:  Current Outpatient Prescriptions  Medication Sig Dispense Refill  . albuterol (PROVENTIL HFA;VENTOLIN HFA) 108 (90 BASE) MCG/ACT inhaler Inhale 2 puffs into the lungs every 6 (six) hours as needed for wheezing. For wheezing      . ARIPiprazole (ABILIFY) 2 MG tablet Take 1 tablet (2 mg total) by mouth at bedtime.  30 tablet  5  . budesonide-formoterol (SYMBICORT) 160-4.5 MCG/ACT inhaler Inhale 2 puffs into the lungs 2 (two) times daily.      . clonazePAM (KLONOPIN) 1 MG tablet 1 bid 1 prn  90 tablet  4  . DULoxetine (CYMBALTA) 60 MG capsule Take 1 capsule (60 mg total) by mouth daily.  30 capsule   5  . fluticasone-salmeterol (ADVAIR HFA) 115-21 MCG/ACT inhaler Inhale 2 puffs into the lungs 2 (two) times daily.        . furosemide (LASIX) 20 MG tablet Take 20 mg by mouth every morning.       Marland Kitchen HYDROcodone-acetaminophen (NORCO/VICODIN) 5-325 MG per tablet Take 1 tablet by mouth every 8 (eight) hours as needed for pain. For pain      . levothyroxine (SYNTHROID, LEVOTHROID) 50 MCG tablet Take 50 mcg by mouth daily.        . potassium chloride (KLOR-CON) 10 MEQ CR tablet Take 10 mEq by mouth every morning.       . zafirlukast (ACCOLATE) 20 MG tablet Take 20 mg by mouth 2 (two) times daily.       No current facility-administered medications for this visit.    Previous Psychotropic Medications:  Medication Dose   Cymbalta 60 mg   60 mg   Abilify   2 mg   Klonopin   1 mg twice a day 1 mg when necessary  Substance Abuse History in the last 12 months: Substance Age of 1st Use Last Use Amount Specific Type                                                                                              Medical Consequences of Substance Abuse:   Legal Consequences of Substance Abuse:   Family Consequences of Substance Abuse:   Blackouts:   DT's:   Withdrawal Symptoms:     Social History: Current Place of Residence[place] Place of Birth[place]  Family Members:  Marital Status:  Divorced Children: 2  Sons:   Daughters:  Relationships:  Education:  GED Educational Problems/Performance:  Religious Beliefs/Practices:  History of Abuse:  Ship broker History:   Legal History:  Hobbies/Interests:   Family History:   Family History  Problem Relation Age of Onset  . Depression Sister     Mental Status Examination/Evaluation: Objective:  Appearance: Casual  Eye Contact::  Good  Speech:  Clear and Coherent  Volume:  Normal  Mood: good  Affect:  Congruent  Thought Process:  Goal Directed  Orientation:  Full (Time, Place, and  Person)  Thought Content:  WDL  Suicidal Thoughts:  No  Homicidal Thoughts:  No  Judgement:  Good  Insight:  Good  Psychomotor Activity:  Normal  Akathisia:  No  Handed:  Right  AIMS (if indicated):  nl  Assets:  Communication Skills    Laboratory/X-Ray Psychological Evaluation(s)        Assessment:  Axis I: Major Depression, Recurrent severe  AXIS I Major depression  AXIS II no  AXIS III Past Medical History  Diagnosis Date  . Depression   . Asthma   . Hypothyroidism      AXIS IV economic problems  AXIS V 51-60 moderate symptoms   Treatment Plan/Recommendations:  Plan of Care: At this time the patient will continue taking Cymbalta 60 mg in the morning, Abilify 2 mg in the morning, Klonopin 1 mg twice a day with 1 when necessary.   Laboratory:    Psychotherapy: At this time the patient will be referred for supportive psychotherapy   Medications: Cymbalta 60 mg, Abilify 2 mg, Klonopin 1 mg twice a day 1 mg when necessary   Routine PRN Medications:    Consultations:   Safety Concerns:    Other:      Haskel Schroeder, MD 8/28/201510:42 AM

## 2014-08-16 ENCOUNTER — Encounter (HOSPITAL_COMMUNITY): Payer: Self-pay | Admitting: Psychology

## 2014-08-16 ENCOUNTER — Ambulatory Visit (INDEPENDENT_AMBULATORY_CARE_PROVIDER_SITE_OTHER): Payer: 59 | Admitting: Psychology

## 2014-08-16 DIAGNOSIS — F330 Major depressive disorder, recurrent, mild: Secondary | ICD-10-CM

## 2014-08-16 NOTE — Progress Notes (Deleted)
   THERAPIST PROGRESS NOTE  Session Time: ***  Participation Level: {BHH PARTICIPATION LEVEL:22264}  Behavioral Response: {Appearance:22683}{BHH LEVEL OF CONSCIOUSNESS:22305}{BHH MOOD:22306}  Type of Therapy: {CHL AMB BH Type of Therapy:21022741}  Treatment Goals addressed: {CHL AMB BH Treatment Goals Addressed:21022754}  Interventions: {CHL AMB BH Type of Intervention:21022753}  Summary: Elaine Byrd is a 68 y.o. female who presents with ***.   Suicidal/Homicidal: {BHH YES OR NO:22294}{yes/no/with/without intent/plan:22693}  Therapist Response: ***  Plan: Return again in *** weeks.  Diagnosis: Axis I: {psych axis 1:31909}    Axis II: {psych axis 2:31910}    YATES,LEANNE, LPC 08/16/2014

## 2014-08-16 NOTE — Progress Notes (Signed)
Elaine Byrd is a 68 y.o. female patient referred by [name] for supportive counseling.    Patient:   Elaine Byrd   DOB:   01/01/1946  MR Number:  [id number]  Location:  [facility] [place] [id number] [place] [address] Dept: [phone]           Date of Service:   08/16/14  Start Time:   10am End Time:   10.50am  Provider/Observer:  [facility]       Billing Code/Service: [id number]  Chief Complaint:     Chief Complaint  Patient presents with  . Depression    Reason for Service:  Pt is referred for "supporitve counseling" by [name]. Pt reports hx of tx for MDD for the past several years and was receiving tx from PCP.  Pt reported she has been stable on Abilify and Cymbalta for over the past 2 years.  She recently had change in PCP and that physician was not comfortable prescribing Abilify so she began medication management August 2015 w/ [name].  Pt reports her major stressors are financial and pain from neuropathy.    Current Status:  Pt reports her depression is well managed w/ medication and her own coping skills.  Pt reports no daily depression and only recently some mild loss of interest but feels related to effect flare up in asthma having on her ability to do things.  Pt discussed her want to maybe getting a part time job that she is able to manage w/ medical issues and secure transportation to have more flexibility with going.    Reliability of Information: Pt provided information and [name] note reviewed.   Behavioral Observation: Elaine Byrd  presents as a 68 y.o.-year-old  Caucasian Female who appeared her stated age. her dress was Appropriate and she was Well Groomed and her manners were Appropriate to the situation.  There were not any physical disabilities noted.  she displayed an appropriate level of cooperation and motivation.    Interactions:    Active    Attention:   within normal limits  Memory:   within normal limits  Visuo-spatial:   not examined  Speech (Volume):  normal  Speech:   normal pitch and normal volume  Thought Process:  Coherent and Relevant  Though Content:  WNL  Orientation:   person, place, time/date and situation  Judgment:   Good  Planning:   Good  Affect:    Appropriate  Mood:    "Good"  Insight:   Good  Intelligence:   normal  Marital Status/Living/Social Hx: Pt is divorced since 11-Apr-1995. Pt lives in an apartment by herself with her dog that she walks 4x a day.  Pt has 2 children- son who is 67 w/ a grandson who is 39 y/o and daughter who is 36y/o w/ 2 granddaughters age 82 and 12.  Pt reports that granddaughters spend most weekends w/ her.  Pt reported that parents were married and she grew up moving around often as dad was in the [employer].  Both parents are deceased- dad in 2003-04-11, mom in 04/10/2010.  Pt had a younger sister who is deceased by completed suicide in 10-Apr-2006. Pt reports she doesn't have current transportation so depends on her son to borrow his car.    Strengths/Supports:  Pt reports good relationship w/ her children and grandchildren.  Pt reports dog is companionship.  Pt stays in touch w/  extended family in [place] and [place] through social media.  Pt reports she enjoys knitting and reading and stays connected w/ her friends on the phone.    Current Employment: Pt on SSI since age 76 y/o.    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    Substance Use:  No concerns of substance abuse are reported.  Pt had documented past use of drinking a bottle of wine a day for 2 years beginning in 2010/04/20. Pt reports no only drinks maybe 3 drinks a month.   Education:   GED  Medical History:   Past Medical History  Diagnosis Date  . Depression   . Asthma   . Hypothyroidism   . Neuropathy   . Acid reflux         Outpatient Encounter Prescriptions as of 08/16/2014  Medication  Sig  . albuterol (PROVENTIL HFA;VENTOLIN HFA) 108 (90 BASE) MCG/ACT inhaler Inhale 2 puffs into the lungs every 6 (six) hours as needed for wheezing. For wheezing  . ARIPiprazole (ABILIFY) 2 MG tablet Take 1 tablet (2 mg total) by mouth at bedtime.  . budesonide-formoterol (SYMBICORT) 160-4.5 MCG/ACT inhaler Inhale 2 puffs into the lungs 2 (two) times daily.  . clonazePAM (KLONOPIN) 1 MG tablet 1 bid 1 prn  . DULoxetine (CYMBALTA) 60 MG capsule Take 1 capsule (60 mg total) by mouth daily.  . fluticasone-salmeterol (ADVAIR HFA) 115-21 MCG/ACT inhaler Inhale 2 puffs into the lungs 2 (two) times daily.    . furosemide (LASIX) 20 MG tablet Take 20 mg by mouth every morning.   Marland Kitchen HYDROcodone-acetaminophen (NORCO/VICODIN) 5-325 MG per tablet Take 1 tablet by mouth every 8 (eight) hours as needed for pain. For pain  . levothyroxine (SYNTHROID, LEVOTHROID) 50 MCG tablet Take 50 mcg by mouth daily.    . potassium chloride (KLOR-CON) 10 MEQ CR tablet Take 10 mEq by mouth every morning.   . zafirlukast (ACCOLATE) 20 MG tablet Take 20 mg by mouth 2 (two) times daily.        Taking meds as prescribed  Sexual History:   History  Sexual Activity  . Sexual Activity: Not on file    Abuse/Trauma History: Pt denies past trauma or abuse.  Pt had series of losses beginning w/ lay off in Apr 21, 2003, w/ dad's death following in same year, her home becoming foreclosed on and sister's suicide in 20-Apr-2006 and mother's death in 04-20-10.    Psychiatric History:  Pt has been tx for MDD by PCP for several years. Pt is now in tx w/ Dr. Judie Bonus.   Family Med/Psych History:  Family History  Problem Relation Age of Onset  . Depression Sister   . Suicidality Sister   . Bipolar disorder Sister   . Depression Mother     Risk of Suicide/Violence: virtually non-existent Pt denies any SI over 2 years.  Pt documented overdose on medications 2 yeas ago prior to becoming stable on current medication regimen.  Pt denies any intent or  plan.    Impression/DX:  Pt is a 68 y/o divorced female who presents as referred by [name].  Pt reports hx of tx for MDD and reports that she is stable w/ her depression on current medication regimen and use of support system, coping skills and self care. Pt good insight into her stressors and how she is handling.  Pt doesn't identify any current needs for counseling and pt aware of services available if relapse of her depression.  Disposition/Plan:  F/U w/ [name] as scheduled on 10/30/14.  Pt to continue use of her coping skills/self care/natural supports to remain in remission.   Diagnosis:    MDD, recurrent in partial to full remission         Onita Pfluger, LPC

## 2014-08-21 ENCOUNTER — Encounter (HOSPITAL_COMMUNITY): Payer: Self-pay | Admitting: Psychology

## 2014-10-30 ENCOUNTER — Ambulatory Visit (HOSPITAL_COMMUNITY): Payer: Self-pay | Admitting: Psychiatry

## 2014-11-01 ENCOUNTER — Ambulatory Visit (HOSPITAL_COMMUNITY): Payer: Self-pay | Admitting: Psychiatry

## 2014-11-15 ENCOUNTER — Ambulatory Visit (INDEPENDENT_AMBULATORY_CARE_PROVIDER_SITE_OTHER): Payer: 59 | Admitting: Psychiatry

## 2014-11-15 VITALS — BP 125/66 | HR 80 | Ht 62.0 in | Wt 210.0 lb

## 2014-11-15 DIAGNOSIS — F330 Major depressive disorder, recurrent, mild: Secondary | ICD-10-CM

## 2014-11-15 DIAGNOSIS — F332 Major depressive disorder, recurrent severe without psychotic features: Secondary | ICD-10-CM

## 2014-11-15 MED ORDER — DULOXETINE HCL 60 MG PO CPEP: 60.0000 mg | ORAL_CAPSULE | Freq: Every day | ORAL | Status: DC

## 2014-11-15 MED ORDER — CLONAZEPAM 1 MG PO TABS: ORAL_TABLET | ORAL | Status: DC

## 2014-11-15 MED ORDER — ARIPIPRAZOLE 2 MG PO TABS: 2.0000 mg | ORAL_TABLET | Freq: Every day | ORAL | Status: DC

## 2014-11-15 NOTE — Progress Notes (Signed)
[facility]  11/15/2014 11:00 AM Elaine Byrd  MRN:  [id number] Subjective: Doing well Today the patient is seen in a routine visit. Her mood is good. She says she has usually has talked times during the holidays but feels like she is get a make it. The patient is sleeping and eating well. She's got good energy. She's had some medical changes. She had a cataract operation on her left eye which is been very successful and she'll do the right eye in January. The patient been having some problems with her asthma and she is actively having it treated. The patient had her [facility], for the treatment of the pain from her peripheral neuropathy. It should be noted that that's not clear what the cause of her peripheral neuropathy is and I suspect it might of been alcohol exposure from the past. The patient takes 60 mg of Cymbalta  and today we talked about her Abilify. Today the patient had a aims scale that showed no evidence of TD. The patient was educated on the possibility of TD in the distant future and the possibility of discontinuing her Abilify in the next 6 months. The patient is interested in doing just that. Her pain does affect her sleep somewhat but otherwise she sleeps well. Reviewed my opinion that the patient does not have bipolar disorder. The patient unfortunately did get a bottle of alcohol and drank it in 2-3 days. Today we had a long talk about the dangers of alcohol. However her case would be destructive to take the medicine she is taking with alcohol and that she is a history of alcohol abuse. Further I shared again that her peripheral neuropathy and the pain from it might in fact have been caused by alcohol. Therefore I strongly recommended that she sees any alcohol at this time. Overall the patient is actually doing very well. She's not suicidal. She is not psychotic. Diagnosis:   DSM5: Schizophrenia Disorders:   Obsessive-Compulsive Disorders:    Trauma-Stressor Disorders:   Substance/Addictive Disorders:   Depressive Disorders:  Major Depressive Disorder - Moderate (296.22) Total Time spent with patient: 30 minutes  Axis I: Major Depression, Recurrent severe  ADL's:  Intact  Sleep: Good  Appetite:  Good  Suicidal Ideation:  no Homicidal Ideation:  none AEB (as evidenced by):  Psychiatric Specialty Exam: Physical Exam  ROS  Blood pressure 125/66, pulse 80, height 5\' 2"  (1.575 m), weight 210 lb (95.255 kg).Body mass index is 38.4 kg/(m^2).  General Appearance: Casual  Eye Contact::  Good  Speech:  Clear and Coherent  Volume:  Normal  Mood:  Negative  Affect:  Congruent  Thought Process:  Coherent  Orientation:  Full (Time, Place, and Person)  Thought Content:  WDL  Suicidal Thoughts:  No  Homicidal Thoughts:  No  Memory:  NA  Judgement:  Good  Insight:  Good  Psychomotor Activity:  Normal  Concentration:  Good  Recall:  Good  Fund of Knowledge:Good  Language: Good  Akathisia:  No  Handed:  Right  AIMS (if indicated):     Assets:  Communication Skills  Sleep:      Musculoskeletal: Strength & Muscle Tone:  Gait & Station:  Patient leans:   Current Medications: Current Outpatient Prescriptions  Medication Sig Dispense Refill  . albuterol (PROVENTIL HFA;VENTOLIN HFA) 108 (90 BASE) MCG/ACT inhaler Inhale 2 puffs into the lungs every 6 (six) hours as needed for wheezing. For wheezing    . ARIPiprazole (  ABILIFY) 2 MG tablet Take 1 tablet (2 mg total) by mouth at bedtime. 30 tablet 5  . budesonide-formoterol (SYMBICORT) 160-4.5 MCG/ACT inhaler Inhale 2 puffs into the lungs 2 (two) times daily.    . clonazePAM (KLONOPIN) 1 MG tablet 1 bid 1 prn 90 tablet 4  . DULoxetine (CYMBALTA) 60 MG capsule Take 1 capsule (60 mg total) by mouth daily. 30 capsule 5  . fluticasone-salmeterol (ADVAIR HFA) 115-21 MCG/ACT inhaler Inhale 2 puffs into the lungs 2 (two) times daily.      . furosemide (LASIX) 20 MG tablet Take  20 mg by mouth every morning.     Marland Kitchen. HYDROcodone-acetaminophen (NORCO/VICODIN) 5-325 MG per tablet Take 1 tablet by mouth every 8 (eight) hours as needed for pain. For pain    . levothyroxine (SYNTHROID, LEVOTHROID) 50 MCG tablet Take 50 mcg by mouth daily.      . potassium chloride (KLOR-CON) 10 MEQ CR tablet Take 10 mEq by mouth every morning.     . zafirlukast (ACCOLATE) 20 MG tablet Take 20 mg by mouth 2 (two) times daily.     No current facility-administered medications for this visit.    Lab Results: No results found for this or any previous visit (from the past 48 hour(s)).  Physical Findings: AIMS:  , ,  ,  ,    CIWA:    COWS:     Treatment Plan Summary: At this time we she'll continue her Klonopin 1 mg 3 times a day, Cymbalta 60 mg and Abilify 2 mg. The patient to return to see us in 10 weeks and at that time we will likely discontinue her Abilify. She's been on this agent now for a number of years. I will go ahead at that time increased to increase her Cymbalta to a dose of 120 mg. I'm not clear that this will really help her depression all that much but will help her pain which may inadvertently help her mood state. Overall though the patient is showing no clear evidence of major depression actively at this time. She's not suicidal and she is functioning very well.  Plan:  Medical Decision Making Problem Points:  Established problem, stable/improving (1) Data Points:  Review of medication regiment & side effects (2)  I certify that inpatient services furnished can reasonably be expected to improve the patient's condition.   Kelechi Astarita IRVING 11/15/2014, 11:00 AM

## 2014-12-10 ENCOUNTER — Encounter (HOSPITAL_COMMUNITY): Payer: Self-pay | Admitting: Psychology

## 2014-12-10 DIAGNOSIS — F32A Depression, unspecified: Secondary | ICD-10-CM

## 2014-12-10 DIAGNOSIS — F329 Major depressive disorder, single episode, unspecified: Secondary | ICD-10-CM

## 2014-12-10 NOTE — Progress Notes (Signed)
Elaine Byrd is a 69 y.o. female patient who was referred for counseling by Dr. Donell BeersPlovsky.  Outpatient Therapist Discharge Summary  Elaine Leversrin F Harston    01/04/1946   Admission Date: 08/16/14   Discharge Date:  12/10/14 Reason for Discharge: Pt didn't meet criteria for counseling Diagnosis:   MDD in remission   Comments:  Pt attended assessment as referred and denied any active depressive symptoms or problems w/ functioning.  Pt reported using her coping skills and natural supports.    Malena PeerLeanne Yates           YATES,LEANNE, LPC

## 2015-01-10 ENCOUNTER — Ambulatory Visit (INDEPENDENT_AMBULATORY_CARE_PROVIDER_SITE_OTHER): Payer: 59 | Admitting: Psychiatry

## 2015-01-10 ENCOUNTER — Encounter (HOSPITAL_COMMUNITY): Payer: Self-pay | Admitting: Psychiatry

## 2015-01-10 VITALS — BP 138/85 | HR 90 | Ht 62.0 in | Wt 208.6 lb

## 2015-01-10 DIAGNOSIS — F339 Major depressive disorder, recurrent, unspecified: Secondary | ICD-10-CM | POA: Diagnosis not present

## 2015-01-10 DIAGNOSIS — F331 Major depressive disorder, recurrent, moderate: Secondary | ICD-10-CM

## 2015-01-10 MED ORDER — DULOXETINE HCL 60 MG PO CPEP: ORAL_CAPSULE | ORAL | Status: DC

## 2015-01-10 MED ORDER — CLONAZEPAM 1 MG PO TABS: ORAL_TABLET | ORAL | Status: DC

## 2015-01-10 NOTE — Progress Notes (Addendum)
[facility] MD Progress Note  01/10/2015 10:21 AM Elaine Byrd  MRN:  [id number] Subjective:  Crappy Today the patient talked about her chronic leg pain. She has peripheral neuropathy which I suspect is related to alcohol abuse the continued for years up until 04-18-10. She claimed it was related to the caregiving of her mother who died in 2010/04/18 and then she claims she stopped drinking. She denies any significant consequences from her alcohol in terms of DUIs but does claim that her drinking annoyed her family. I think she is now concluding that her alcohol abuse likely did have health consequences and producing peripheral neuropathy. The patient has had some other medical problems that seem to be well-controlled including asthma which is well treated with medications. The patient has been started on a new medication for her peripheral neuropathy by her neurologist. That agent is called topiramate. The patient says she's only been on it for about a week and initially it made her extremely fatigued and she slept all the time. Only in the last 2-3 days she now feeling more awake and alert. This is a particular problem given the fact that recently she is moved from one apartment to another and is trying to unpack boxes. Only now that she started feeling like she has enough energy to do that. She is no complaints of sleep. She says she eats good food but she only eats when she is hungry and that's not that often. Her sleep pattern has not changed. Before the patient was started on the new agent from the neurologist her energy was good and her ability to think and concentration was good. The patient denies anhedonia. She enjoys the computer, knitting and reading. Her biggest complaint is her chronic pain in her legs. She denies daily depression. There was some confusion about the dose of Cymbalta. I imply that we are going to increase it on her last visit but we did not. Her last visit we had a long discussion of the potential  dangers of Abilify today she's agreed to discontinue it. The patient was on a small dose. At this time she continues taking Klonopin 1 mg 3 times a day which would be another focus on her next visit. This patient is not suicidal. She claims at this time she drinks no alcohol. She uses no illicit substances. Principal Problem: Major Depression Recurent Diagnosis:   Patient Active Problem List   Diagnosis Date Noted  . Tylenol overdose [T39.1X4A] 02/01/2013  . Suicidal ideation [R45.851] 02/01/2013  . Depression [F32.9] 02/01/2013  . Hypothyroidism [E03.9] 02/01/2013  . Asthma [J45.909] 02/01/2013   Total Time spent with patient: 30 minutes   Past Medical History:  Past Medical History  Diagnosis Date  . Depression   . Asthma   . Hypothyroidism   . Neuropathy   . Acid reflux     Past Surgical History  Procedure Laterality Date  . Neck surgery    . Abdominal hysterectomy    . Cholecystectomy    . Nasal sinus surgery    . Cataract extraction bilateral w/ anterior vitrectomy Bilateral 12/15 and 01/16   Family History:  Family History  Problem Relation Age of Onset  . Depression Sister   . Suicidality Sister   . Bipolar disorder Sister   . Depression Mother    Social History:  History  Alcohol Use  . 0.6 oz/week  . 1 Glasses of wine per week    Comment: maybe 1-3 drink a month  History  Drug Use No    History   Social History  . Marital Status: Divorced    Spouse Name: N/A  . Number of Children: N/A  . Years of Education: N/A   Social History Main Topics  . Smoking status: Former Smoker -- 1.00 packs/day    Types: Cigarettes    Quit date: 06/29/1981  . Smokeless tobacco: Never Used  . Alcohol Use: 0.6 oz/week    1 Glasses of wine per week     Comment: maybe 1-3 drink a month  . Drug Use: No  . Sexual Activity: Not on file   Other Topics Concern  . None   Social History Narrative   Additional History:    Sleep: Fair  Appetite:   Good   Assessment:   Musculoskeletal: Strength & Muscle Tone:  Gait & Station:  Patient leans:    Psychiatric Specialty Exam: Physical Exam  ROS  Blood pressure 138/85, pulse 90, height 5\' 2"  (1.575 m), weight 208 lb 9.6 oz (94.62 kg).Body mass index is 38.14 kg/(m^2).  General Appearance: Casual  Eye Contact::  Good  Speech:  Clear and Coherent  Volume:  Normal  Mood:  Euthymic  Affect:  Congruent  Thought Process:  Coherent  Orientation:  Full (Time, Place, and Person)  Thought Content:  WDL  Suicidal Thoughts:  No  Homicidal Thoughts:  No  Memory:  NA  Judgement:  Good  Insight:  Good  Psychomotor Activity:  NA  Concentration:  Good  Recall:  Good  Fund of Knowledge:Good  Language: Good  Akathisia:  No  Handed:  Right  AIMS (if indicated):     Assets:  Desire for Improvement  ADL's:  Intact  Cognition: WNL  Sleep:        Current Medications: Current Outpatient Prescriptions  Medication Sig Dispense Refill  . albuterol (PROVENTIL HFA;VENTOLIN HFA) 108 (90 BASE) MCG/ACT inhaler Inhale 2 puffs into the lungs every 6 (six) hours as needed for wheezing. For wheezing    . ARIPiprazole (ABILIFY) 2 MG tablet Take 1 tablet (2 mg total) by mouth at bedtime. 30 tablet 5  . budesonide-formoterol (SYMBICORT) 160-4.5 MCG/ACT inhaler Inhale 2 puffs into the lungs 2 (two) times daily.    . clonazePAM (KLONOPIN) 1 MG tablet 1 bid 1 prn 90 tablet 4  . DULoxetine (CYMBALTA) 60 MG capsule 2  q pm 60 capsule 5  . furosemide (LASIX) 20 MG tablet Take 20 mg by mouth every morning.     Marland Kitchen. levothyroxine (SYNTHROID, LEVOTHROID) 50 MCG tablet Take 50 mcg by mouth daily.      . potassium chloride (KLOR-CON) 10 MEQ CR tablet Take 10 mEq by mouth every morning.     . topiramate (TOPAMAX) 25 MG tablet Take 25 mg by mouth 2 (two) times daily. After 2 weeks increase to 2 tablets twice a day    . fluticasone-salmeterol (ADVAIR HFA) 115-21 MCG/ACT inhaler Inhale 2 puffs into the lungs 2 (two)  times daily.      Marland Kitchen. HYDROcodone-acetaminophen (NORCO/VICODIN) 5-325 MG per tablet Take 1 tablet by mouth every 8 (eight) hours as needed for pain. For pain    . zafirlukast (ACCOLATE) 20 MG tablet Take 20 mg by mouth 2 (two) times daily.     No current facility-administered medications for this visit.    Lab Results: No results found for this or any previous visit (from the past 48 hour(s)).  Physical Findings: AIMS:  , ,  ,  ,  CIWA:    COWS:     Treatment Plan Summary: At this time we she'll discontinue this patient's Abilify. The patient will increase her Cymbalta from a dose of 60 mg 2 a dose of 120 mg. For the time being she'll continue taking Klonopin 1 mg 3 times a day which she says helps her anxiety a great deal. She claims a Cymbalta has been very helpful and wishes to continue it. We will go ahead and increase it with an effort to reduce her pain. The patient takes a high dose of Tylenol and she knows this is potentially a problem. The patient will be patient with her new pain medication and give it some time. The patient one time was in therapy at this time seems to be doing okay without it. She is involved in her treatment for her pain, takes her psychiatric medicines appropriately and for now has avoided any use of alcohol. This patient to return to see me in 3 months. At this time she weighs at 208 pounds and her goal is for her to get below 200 pounds on her next visit and have reductions in her pain which she says is her biggest problem.   Medical Decision Making:  Review of Medication Regimen & Side Effects (2)     Koriana Stepien IRVING 01/10/2015, 10:21 AM

## 2015-04-11 ENCOUNTER — Ambulatory Visit (HOSPITAL_COMMUNITY): Payer: Self-pay | Admitting: Psychiatry

## 2015-05-21 ENCOUNTER — Ambulatory Visit (INDEPENDENT_AMBULATORY_CARE_PROVIDER_SITE_OTHER): Payer: 59 | Admitting: Psychiatry

## 2015-05-21 ENCOUNTER — Encounter (HOSPITAL_COMMUNITY): Payer: Self-pay | Admitting: Psychiatry

## 2015-05-21 VITALS — BP 128/88 | HR 101 | Ht 62.0 in | Wt 194.4 lb

## 2015-05-21 DIAGNOSIS — F3340 Major depressive disorder, recurrent, in remission, unspecified: Secondary | ICD-10-CM

## 2015-05-21 DIAGNOSIS — F331 Major depressive disorder, recurrent, moderate: Secondary | ICD-10-CM

## 2015-05-21 MED ORDER — DULOXETINE HCL 60 MG PO CPEP: ORAL_CAPSULE | ORAL | Status: DC

## 2015-05-21 MED ORDER — CLONAZEPAM 1 MG PO TABS: ORAL_TABLET | ORAL | Status: DC

## 2015-05-21 NOTE — Progress Notes (Signed)
[facility] MD Progress Note  05/21/2015 4:23 PM Elaine Byrd  MRN:  [id number] Subjective:  Less pain Principal Problem: Major depression recurrent remission Diagnosis:  Major depression recurrent remission Today the patient shares with me that is been some changes in her medicine. Unfortunately she missed her last point with me because she could not get transportation. In the interval she saw her neurologist. Apparently the patient was taking Topamax and was having significant side effects and the neurologist reduce the Topamax to half the dose. Nonetheless even after reducing it the patient still felt very lethargic and sedated. She thought she could attributed to the Cymbalta and went ahead and reduced her Cymbalta from 120 mg down to 60 mg. The patient claims that she felt better less lethargic on the lower dose. While I do not believe that Cymbalta was probably doing this the truth is is that I'm treating her depression and it 60 mg that should have an effect. The patient acknowledges that in fact her depression is good. She is sleeping and eating well. She feels less oversedated. Her pain overall is better in her leg although now she's describing some sensory sensations in her hands and in her feet. At this time the patient does describe the complaint of feeling overly sensitive. She decides risks described herself as being overwhelmed. She feels very emotional. I suspect this might be a phenomenon from coming down on her Cymbalta. At this time I will not make it change. We will ultimately attempt to reduce her Klonopin but we will give her another couple of months and see her back for a longer extensive visit. Today's was a 15 minute visit. Overall the patient feels stable emotionally. She denies chest pain or shortness of breath. Besides some numbing and tingling in her fingertips and some discomfort in her legs she is no other neurological complaints. She is not having headache she is not dizzy and she's not  confused. The patient is not suicidal. The patient has no psychotic symptoms. Patient Active Problem List   Diagnosis Date Noted  . Major depressive disorder, recurrent episode, moderate [F33.1] 01/10/2015  . Tylenol overdose [T39.1X4A] 02/01/2013  . Suicidal ideation [R45.851] 02/01/2013  . Depression [F32.9] 02/01/2013  . Hypothyroidism [E03.9] 02/01/2013  . Asthma [J45.909] 02/01/2013   Total Time spent with patient: 15 minutes   Past Medical History:  Past Medical History  Diagnosis Date  . Depression   . Asthma   . Hypothyroidism   . Neuropathy   . Acid reflux     Past Surgical History  Procedure Laterality Date  . Neck surgery    . Abdominal hysterectomy    . Cholecystectomy    . Nasal sinus surgery    . Cataract extraction bilateral w/ anterior vitrectomy Bilateral 12/15 and 01/16   Family History:  Family History  Problem Relation Age of Onset  . Depression Sister   . Suicidality Sister   . Bipolar disorder Sister   . Depression Mother    Social History:  History  Alcohol Use  . 0.6 oz/week  . 1 Glasses of wine per week    Comment: maybe 1-3 drink a month     History  Drug Use No    History   Social History  . Marital Status: Divorced    Spouse Name: N/A  . Number of Children: N/A  . Years of Education: N/A   Social History Main Topics  . Smoking status: Former Smoker -- 1.00 packs/day  Types: Cigarettes    Quit date: 06/29/1981  . Smokeless tobacco: Never Used  . Alcohol Use: 0.6 oz/week    1 Glasses of wine per week     Comment: maybe 1-3 drink a month  . Drug Use: No  . Sexual Activity: Not on file   Other Topics Concern  . None   Social History Narrative   Additional History:    Sleep: Good  Appetite:  Good   Assessment:   Musculoskeletal: Strength & Muscle Tone: within normal limits Gait & Station:nl Patient leans: N/A   Psychiatric Specialty Exam: Physical Exam  ROS  Blood pressure 128/88, pulse 101, height 5'  2" (1.575 m), weight 194 lb 6.4 oz (88.179 kg).Body mass index is 35.55 kg/(m^2).  General Appearance: Casual  Eye Contact::  Good  Speech:  Clear and Coherent  Volume:  Normal  Mood:  Euthymic  Affect:  Appropriate  Thought Process:  Coherent  Orientation:  Full (Time, Place, and Person)  Thought Content:  WDL  Suicidal Thoughts:  No  Homicidal Thoughts:  No  Memory:  NA  Judgement:  Fair  Insight:  Good  Psychomotor Activity:  Normal  Concentration:  NA  Recall:  NA  Fund of Knowledge:Good  Language: Good  Akathisia:  No  Handed:  Right  AIMS (if indicated):     Assets:  Communication Skills  ADL's:  Intact  Cognition: WNL  Sleep:        Current Medications: Current Outpatient Prescriptions  Medication Sig Dispense Refill  . albuterol (PROVENTIL HFA;VENTOLIN HFA) 108 (90 BASE) MCG/ACT inhaler Inhale 2 puffs into the lungs every 6 (six) hours as needed for wheezing. For wheezing    . budesonide-formoterol (SYMBICORT) 160-4.5 MCG/ACT inhaler Inhale 2 puffs into the lungs 2 (two) times daily.    . clonazePAM (KLONOPIN) 1 MG tablet 1 bid 1 prn 90 tablet 2  . DULoxetine (CYMBALTA) 60 MG capsule 1  q pm 30 capsule 5  . fluticasone-salmeterol (ADVAIR HFA) 115-21 MCG/ACT inhaler Inhale 2 puffs into the lungs 2 (two) times daily.      . furosemide (LASIX) 20 MG tablet Take 20 mg by mouth every morning.     Marland Kitchen HYDROcodone-acetaminophen (NORCO/VICODIN) 5-325 MG per tablet Take 1 tablet by mouth every 8 (eight) hours as needed for pain. For pain    . levothyroxine (SYNTHROID, LEVOTHROID) 50 MCG tablet Take 50 mcg by mouth daily.      . potassium chloride (KLOR-CON) 10 MEQ CR tablet Take 10 mEq by mouth every morning.     . topiramate (TOPAMAX) 25 MG tablet Take 25 mg by mouth 2 (two) times daily. After 2 weeks increase to 2 tablets twice a day    . zafirlukast (ACCOLATE) 20 MG tablet Take 20 mg by mouth 2 (two) times daily.     No current facility-administered medications for this  visit.    Lab Results: No results found for this or any previous visit (from the past 48 hour(s)).  Physical Findings: AIMS:  , ,  ,  ,    CIWA:    COWS:     Treatment Plan Summary: At this time the patient's #1 problem is leg pain which is improved on Topamax from her neurologist. Her second problem is that of depression which is better even taking Cymbalta at 60 mg. I do think there was some medication problems with both her Topamax and perhaps with her Cymbalta that I think is going to resolve itself. Today  we spent greater than 50% of the time counseling and coordinating her care. At this time the patient does not drink any alcohol and that does not seem to be an issue. At this time I will not interpret her oversensitive feeling in her feeling of being overwhelmed as depression. I think I'll give this patient some time and will have an extensive discussion on her next visit about slowly reducing her Klonopin. It is noted that she's on 3 mg and she's been on it for about 2-3 years.   Medical Decision Making:  New problem, with additional work up planned     Lucas Mallow 05/21/2015, 4:23 PM

## 2015-05-26 ENCOUNTER — Telehealth (HOSPITAL_COMMUNITY): Payer: Self-pay | Admitting: *Deleted

## 2015-05-26 NOTE — Telephone Encounter (Signed)
[facility] called to clarify Klonopin as BID and also 1 prn.

## 2015-08-01 ENCOUNTER — Ambulatory Visit (INDEPENDENT_AMBULATORY_CARE_PROVIDER_SITE_OTHER): Payer: 59 | Admitting: Psychiatry

## 2015-08-01 VITALS — BP 128/82 | HR 81 | Ht 62.0 in | Wt 191.2 lb

## 2015-08-01 DIAGNOSIS — F331 Major depressive disorder, recurrent, moderate: Secondary | ICD-10-CM

## 2015-08-01 MED ORDER — CLONAZEPAM 1 MG PO TABS: ORAL_TABLET | ORAL | Status: DC

## 2015-08-01 MED ORDER — DULOXETINE HCL 30 MG PO CPEP: ORAL_CAPSULE | ORAL | Status: DC

## 2015-08-01 NOTE — Progress Notes (Signed)
[facility] MD Progress Note  08/01/2015 10:59 AM Elaine Byrd  MRN:  [id number] Subjective:  Great Principal Problem: Major depression, recurrent episode, moderate Diagnosis:  Major depression, recurrent episode, moderate Patient Active Problem List   Diagnosis Date Noted  . Major depressive disorder, recurrent episode, moderate [F33.1] 01/10/2015  . Tylenol overdose [T39.1X4A] 02/01/2013  . Suicidal ideation [R45.851] 02/01/2013  . Depression [F32.9] 02/01/2013  . Hypothyroidism [E03.9] 02/01/2013  . Asthma [J45.909] 02/01/2013   Total Time spent with patient: 30 minutes   Past Medical History:  Past Medical History  Diagnosis Date  . Depression   . Asthma   . Hypothyroidism   . Neuropathy   . Acid reflux     Past Surgical History  Procedure Laterality Date  . Neck surgery    . Abdominal hysterectomy    . Cholecystectomy    . Nasal sinus surgery    . Cataract extraction bilateral w/ anterior vitrectomy Bilateral 12/15 and 01/16   Family History:  Family History  Problem Relation Age of Onset  . Depression Sister   . Suicidality Sister   . Bipolar disorder Sister   . Depression Mother    Social History:  History  Alcohol Use  . 0.6 oz/week  . 1 Glasses of wine per week    Comment: maybe 1-3 drink a month     History  Drug Use No    Social History   Social History  . Marital Status: Divorced    Spouse Name: N/A  . Number of Children: N/A  . Years of Education: N/A   Social History Main Topics  . Smoking status: Former Smoker -- 1.00 packs/day    Types: Cigarettes    Quit date: 06/29/1981  . Smokeless tobacco: Never Used  . Alcohol Use: 0.6 oz/week    1 Glasses of wine per week     Comment: maybe 1-3 drink a month  . Drug Use: No  . Sexual Activity: Not on file   Other Topics Concern  . Not on file   Social History Narrative   Additional History:    Sleep: Good  Appetite:  Good   Assessment: Patient is doing very well. Her neurologist  correctly took her off Topamax and she is much improved. She has more energy. She is cognitively more focused. She also adjusted up her Cymbalta which is fine to control both pain and depression. She now takes 90 mg. At this time the patient denies daily depression. She sleeping back to normal. Her appetite is slightly reduced but she thinks that her covering. She's getting over a difficult summer taking care of her 51 and 79 year old granddaughter's. She now feels more organized and connected. She joy is watching TV reading and knitting. The patient's cognitive abilities are improved. She denies use of alcohol or drugs and has never been psychotic. The patient lives alone and drives without problems. Overall the patient is doing better. She feels very stable. She is functioning well. She denies anxiety.  Musculoskeletal: Strength & Muscle Tone: within normal limits Gait & Station: normal Patient leans: Right   Psychiatric Specialty Exam: Physical Exam  ROS  Blood pressure 128/82, pulse 81, height  (1.575 m), weight 191 lb 3.2 oz (86.728 kg).Body mass index is 34.96 kg/(m^2).  General Appearance: NA  Eye Contact::  Good  Speech:  Clear and Coherent  Volume:  Normal  Mood:  NA  Affect:  Appropriate  Thought Process:  NA  Orientation:  Full (Time, Place,  and Person)  Thought Content:  WDL  Suicidal Thoughts:  No  Homicidal Thoughts:  No  Memory:  NA  Judgement:  Good  Insight:  Good  Psychomotor Activity:  Normal  Concentration:  Good  Recall:  Good  Fund of Knowledge:Good  Language: Good  Akathisia:  No  Handed:  Right  AIMS (if indicated):     Assets:  Communication Skills  ADL's:  Intact  Cognition: WNL  Sleep:        Current Medications: Current Outpatient Prescriptions  Medication Sig Dispense Refill  . albuterol (PROVENTIL HFA;VENTOLIN HFA) 108 (90 BASE) MCG/ACT inhaler Inhale 2 puffs into the lungs every 6 (six) hours as needed for wheezing. For wheezing    .  budesonide-formoterol (SYMBICORT) 160-4.5 MCG/ACT inhaler Inhale 2 puffs into the lungs 2 (two) times daily.    . clonazePAM (KLONOPIN) 1 MG tablet 1 bid 1 prn 75 tablet 3  . DULoxetine (CYMBALTA) 30 MG capsule 3  q pm 90 capsule 5  . fluticasone-salmeterol (ADVAIR HFA) 115-21 MCG/ACT inhaler Inhale 2 puffs into the lungs 2 (two) times daily.      . furosemide (LASIX) 20 MG tablet Take 20 mg by mouth every morning.     Marland Kitchen HYDROcodone-acetaminophen (NORCO/VICODIN) 5-325 MG per tablet Take 1 tablet by mouth every 8 (eight) hours as needed for pain. For pain    . levothyroxine (SYNTHROID, LEVOTHROID) 50 MCG tablet Take 50 mcg by mouth daily.      . potassium chloride (KLOR-CON) 10 MEQ CR tablet Take 10 mEq by mouth every morning.     . topiramate (TOPAMAX) 25 MG tablet Take 25 mg by mouth 2 (two) times daily. After 2 weeks increase to 2 tablets twice a day    . zafirlukast (ACCOLATE) 20 MG tablet Take 20 mg by mouth 2 (two) times daily.     No current facility-administered medications for this visit.    Lab Results: No results found for this or any previous visit (from the past 48 hour(s)).  Physical Findings: AIMS:  , ,  ,  ,    CIWA:    COWS:     Treatment Plan Summary: Patient is doing very well at this time we'll continue her Cymbalta and I will be the prescriber. She'll continue taking 90 mg. For now we'll continue the Klonopin 1 mg twice a day with 1 extra when necessary. She was taking a lot of a when necessary over the summertime but now she's going to try to reduce it to just taking the next room 1 every other day if necessary. So in essence she is taking 1 mg of Klonopin twice a day with 1 when necessary. The patient is doing very well return to see me in 3 months.   Medical Decision Making:  Self-Limited or Minor (1)     Elaine Byrd 08/01/2015, 10:59 AM

## 2015-09-12 ENCOUNTER — Ambulatory Visit (INDEPENDENT_AMBULATORY_CARE_PROVIDER_SITE_OTHER): Payer: 59 | Admitting: Psychiatry

## 2015-09-12 VITALS — BP 132/78 | HR 98 | Ht 62.0 in | Wt 194.4 lb

## 2015-09-12 DIAGNOSIS — F331 Major depressive disorder, recurrent, moderate: Secondary | ICD-10-CM

## 2015-09-12 MED ORDER — L-METHYLFOLATE 15 MG PO TABS: 15.0000 mg | ORAL_TABLET | ORAL | Status: DC

## 2015-09-12 MED ORDER — DULOXETINE HCL 30 MG PO CPEP: ORAL_CAPSULE | ORAL | Status: DC

## 2015-09-12 NOTE — Progress Notes (Signed)
[facility] MD Progress Note  09/12/2015 10:00 AM Elaine Byrd  MRN:  [id number] Subjective:  Feeling mildly depressed Principal Problem: Major depression, recurrent At this time the patient feels mildly depressed. Is not as persistent or severe but she does doesn't feel herself. The patient sleeps erratically. She does not take naps she does not have daytime dysfunction. Her appetite she says is reduced. She has not lost weight. Her energy level is low. The patient is having no problems concentrating and has a good sense of worth and is not suicidal. She has not anhedonic. She enjoys reading knitting and other things. She does find out that her daughter is pregnant. Her daughter's 77 years of age and her 2 children age 71 and 37 have been cared for by the patient over this past summer. The patient seemed what is common. She'll likely have to help out and start patient is not physically all that well. This is a conflict for the patient. This patient made this appointment early because she felt like her functioning was reduced. He doesn't feel that she's got good self-care. The patient is stop therapy in the past. Patient feels that she's not cleaning up things that she doesn't have the character of the [facility] right is much. She does however that she is not depressed. Not on a persistent basis and she certainly is not suicidal.  Patient Active Problem List   Diagnosis Date Noted  . Major depressive disorder, recurrent episode, moderate (HCC) [F33.1] 01/10/2015  . Tylenol overdose [T39.1X4A] 02/01/2013  . Suicidal ideation [R45.851] 02/01/2013  . Depression [F32.9] 02/01/2013  . Hypothyroidism [E03.9] 02/01/2013  . Asthma [J45.909] 02/01/2013   Total Time spent with patient: 15 minutes  Past Psychiatric History:   Past Medical History:  Past Medical History  Diagnosis Date  . Depression   . Asthma   . Hypothyroidism   . Neuropathy   . Acid reflux     Past Surgical History  Procedure Laterality  Date  . Neck surgery    . Abdominal hysterectomy    . Cholecystectomy    . Nasal sinus surgery    . Cataract extraction bilateral w/ anterior vitrectomy Bilateral 12/15 and 01/16   Family History:  Family History  Problem Relation Age of Onset  . Depression Sister   . Suicidality Sister   . Bipolar disorder Sister   . Depression Mother    Family Psychiatric  History:  Social History:  History  Alcohol Use  . 0.6 oz/week  . 1 Glasses of wine per week    Comment: maybe 1-3 drink a month     History  Drug Use No    Social History   Social History  . Marital Status: Divorced    Spouse Name: N/A  . Number of Children: N/A  . Years of Education: N/A   Social History Main Topics  . Smoking status: Former Smoker -- 1.00 packs/day    Types: Cigarettes    Quit date: 06/29/1981  . Smokeless tobacco: Never Used  . Alcohol Use: 0.6 oz/week    1 Glasses of wine per week     Comment: maybe 1-3 drink a month  . Drug Use: No  . Sexual Activity: Not on file   Other Topics Concern  . Not on file   Social History Narrative   Additional Social History:  Sleep: Erratic   Appetite:  Poor  Current Medications: Current Outpatient Prescriptions  Medication Sig Dispense Refill  . albuterol (PROVENTIL HFA;VENTOLIN HFA) 108 (90 BASE) MCG/ACT inhaler Inhale 2 puffs into the lungs every 6 (six) hours as needed for wheezing. For wheezing    . budesonide-formoterol (SYMBICORT) 160-4.5 MCG/ACT inhaler Inhale 2 puffs into the lungs 2 (two) times daily.    . clonazePAM (KLONOPIN) 1 MG tablet 1 bid 1 prn 75 tablet 3  . DULoxetine (CYMBALTA) 30 MG capsule 3  q pm 90 capsule 5  . fluticasone-salmeterol (ADVAIR HFA) 115-21 MCG/ACT inhaler Inhale 2 puffs into the lungs 2 (two) times daily.      . furosemide (LASIX) 20 MG tablet Take 20 mg by mouth every morning.     Marland Kitchen. HYDROcodone-acetaminophen (NORCO/VICODIN) 5-325 MG per tablet Take 1 tablet by mouth every 8  (eight) hours as needed for pain. For pain    . L-Methylfolate 15 MG TABS Take 1 tablet (15 mg total) by mouth 1 day or 1 dose. 30 tablet 5  . levothyroxine (SYNTHROID, LEVOTHROID) 50 MCG tablet Take 50 mcg by mouth daily.      . potassium chloride (KLOR-CON) 10 MEQ CR tablet Take 10 mEq by mouth every morning.     . topiramate (TOPAMAX) 25 MG tablet Take 25 mg by mouth 2 (two) times daily. After 2 weeks increase to 2 tablets twice a day    . zafirlukast (ACCOLATE) 20 MG tablet Take 20 mg by mouth 2 (two) times daily.     No current facility-administered medications for this visit.    Lab Results: No results found for this or any previous visit (from the past 48 hour(s)).  Physical Findings: AIMS:  , ,  ,  ,    CIWA:    COWS:     Musculoskeletal: Strength & Muscle Tone: within normal limits Gait & Station: normal Patient leans: Right  Psychiatric Specialty Exam: ROS  Blood pressure 132/78, pulse 98, height 5\' 2"  (1.575 m), weight 194 lb 6.4 oz (88.179 kg).Body mass index is 35.55 kg/(m^2).  General Appearance: Casual  Eye Contact::  Good  Speech:  Clear and Coherent  Volume:  Normal  Mood:  NA  Affect:  NA  Thought Process:  Coherent  Orientation:  Negative  Thought Content:  WDL  Suicidal Thoughts:  No  Homicidal Thoughts:  No  Memory:  NA  Judgement:  NA  Insight:  Good  Psychomotor Activity:  NA and Normal  Concentration:  Good  Recall:  Good  Fund of Knowledge:Good  Language: Good  Akathisia:  No  Handed:  Right  AIMS (if indicated):     Assets:  Desire for Improvement  ADL's:  Intact  Cognition: WNL  Sleep:      Treatment Plan Summary: At this time the patient will continue taking Cymbalta 90 mg. I've educated her that this is the highest dose and that higher doses will not make her less depressed. Today went ahead and start her on Deplin. The patient also is agreed to go back into therapy and she'll do that. She'll return to see me in approximately 2 months.  At this time she is mild residual depression. Patient is not suicidal but clearly she's not feeling well. Is my hope the addition of Deplin will be helpful but the possibility of adding Wellbutrin on her next visit will be considered. The patient denies chest pain or shortness breath she denies any neurological symptoms at this time.  Elaine Byrd 09/12/2015, 10:00 AM

## 2015-09-16 ENCOUNTER — Telehealth (HOSPITAL_COMMUNITY): Payer: Self-pay

## 2015-09-16 NOTE — Telephone Encounter (Signed)
Telephone message left for patient this nurse had received her call regarding needed prior authorization for prescribed Deplin and informed on message the prior [facility] but still waiting for a decision.  Informed patient on message this nurse would send message to Dr. Donell BeersPlovsky patient would like to return to Abilify 2mg  if not approved as states this has helped in the past and requested patient call back on 09/17/15 evening when Dr. Donell BeersPlovsky is back in the office to follow up on prior authorization status.

## 2015-09-17 ENCOUNTER — Telehealth (HOSPITAL_COMMUNITY): Payer: Self-pay | Admitting: *Deleted

## 2015-09-17 ENCOUNTER — Telehealth (HOSPITAL_COMMUNITY): Payer: Self-pay

## 2015-09-17 NOTE — Telephone Encounter (Signed)
Fax request for prior authorization of methylfolate. It appears that it was completed by Shawn T. On 09/16/15.

## 2015-09-17 NOTE — Telephone Encounter (Signed)
Met with Dr. Casimiro Needle to discuss patient's status with Deplin being denied by patient's Medicare coverage and Dr. Casimiro Needle reviewed letter.  Dr. Casimiro Needle ordered patient to try SamE, an over-the-counter medication effective as an addition for depression.  Patient is to take one a day and can probably find locally at any health store or [facility].  Called patient to inform of Deplin denial and what Dr. Casimiro Needle wanted her to try in addition to her other medications.  Patient stated plan to find and take the SamE one a day and will contact us back if any problems.  Patient's Deplin discontinued since cannot be filled.

## 2015-09-17 NOTE — Telephone Encounter (Signed)
Medication problem - Fax received from [employer] patient was not approved for Deplin ordered 09/12/15 and would not be covered by patient's insurance.

## 2015-09-22 ENCOUNTER — Ambulatory Visit (INDEPENDENT_AMBULATORY_CARE_PROVIDER_SITE_OTHER): Payer: 59 | Admitting: Psychology

## 2015-09-22 DIAGNOSIS — F331 Major depressive disorder, recurrent, moderate: Secondary | ICD-10-CM | POA: Diagnosis not present

## 2015-09-24 ENCOUNTER — Encounter (HOSPITAL_COMMUNITY): Payer: Self-pay | Admitting: Psychology

## 2015-09-24 NOTE — Progress Notes (Signed)
Elaine Byrd is a 69 y.o. female patient referred for counseling by [name].  Patient:   Elaine Byrd   DOB:   [birth year]  MR Number:  [id number]  Location:  [facility] [place] [id number] [place] [address] Dept: [phone]           Date of Service:   09/22/15  Start Time:   2.30pm- End Time:   3.30pm  Provider/Observer:  Clarene Essex [facility]       Billing Code/Service: [phone]  Chief Complaint:     Chief Complaint  Patient presents with  . Depression    Reason for Service:  Pt reports she is presenting as referred by [name] for counseling due to chronic depression.  Pt reports stressors include her daughter pregnant with 3rd child unexpectedly, the stressors this will have on them and "needing to support her as mom".   Continued stressors from past are financial strain and pain from neuropathy.Pt has goals of wanting to have medication assist her w/ stability of mood and then being able to seek PT job for self. Pt reports hx of tx for MDD for the past several years and received tx from PCP till referred to [name] who has tx since August 2015.  Pt had seen this provider for assessment in past and denied need for counseling as was not dealing was coping w/ her supports and her coping skills.     Current Status:   Pt initially minimizes depression. Pt states "this is not me"- referring to her lack of caring for presentation, lack of motivation, lack of interest and feeling just blah all the time w/ little hope for things to change.  Pt scores an 18 on PHQ 9-  Endorses loss of interest, sleep disturbance, fatigue, loss of appetite and low self worth nearly every day and more than have of days down/depressed.   Reliability of Information: Pt provided information and [name] notes reviewed.   Behavioral Observation: Elaine Byrd  presents as a 69 y.o.-year-old  Caucasian Female who  appeared her stated age. her dress was Appropriate and she was Casual and Fairly Groomed and her manners were Appropriate to the situation.  There were not any physical disabilities noted.  she displayed an appropriate level of cooperation and motivation.    Interactions:    Active   Attention:   within normal limits  Memory:   within normal limits  Visuo-spatial:   not examined  Speech (Volume):  normal  Speech:   normal pitch and normal volume  Thought Process:  Coherent and Relevant  Though Content:  WNL  Orientation:   person, place, time/date and situation  Judgment:   Good  Planning:   Good  Affect:    Depressed  Mood:    Depressed  Insight:   Good and Fair  Intelligence:   normal  Marital Status/Living: Pt is divorced since 1996. Pt lives in an apartment by herself with her dog that she walks 4x a day. Pt has 2 children- son who is 9 w/ a grandson who is 73 y/o and daughter who is 37y/o w/ 2 granddaughters age 75 and 15.  Her daughter is due w/ her 3rd child in May 2017. Pt reports that granddaughters spend most of the summer w/ her and most weekends w/ her. Pt reported that parents were married and she grew up moving around often as dad was in the  [employer]. Both parents are deceased- dad in 07-Apr-2003, mom in 2010/04/06. Pt had a younger sister who is deceased by completed suicide in 04/06/2006. Pt reports that her son has is loaning her a care for transportation.  Pt reports that she does feel good that she hasn't had any falls in 2 years.  Pt enjoys knitting and reading.  Pt reports she has good relationship w/ children and grandchildren.   Current Employment:Pt on SSI since age 76 y/o.   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   Substance Use:No concerns of substance abuse are reported. Pt had documented past use of drinking a bottle of wine a day for 2 years beginning in Apr 06, 2010. Pt reports no only  drinks maybe 3 drinks a month.   Education:GED  Medical History:   Past Medical History  Diagnosis Date  . Depression   . Asthma   . Hypothyroidism   . Neuropathy (HCC)   . Acid reflux         Outpatient Encounter Prescriptions as of 09/22/2015  Medication Sig  . DULoxetine (CYMBALTA) 30 MG capsule 3  q pm  . S-Adenosylmethionine (SAM-E) 200 MG TABS Take 200 mg by mouth daily.  Marland Kitchen albuterol (PROVENTIL HFA;VENTOLIN HFA) 108 (90 BASE) MCG/ACT inhaler Inhale 2 puffs into the lungs every 6 (six) hours as needed for wheezing. For wheezing  . budesonide-formoterol (SYMBICORT) 160-4.5 MCG/ACT inhaler Inhale 2 puffs into the lungs 2 (two) times daily.  . clonazePAM (KLONOPIN) 1 MG tablet 1 bid 1 prn  . fluticasone-salmeterol (ADVAIR HFA) 115-21 MCG/ACT inhaler Inhale 2 puffs into the lungs 2 (two) times daily.    . furosemide (LASIX) 20 MG tablet Take 20 mg by mouth every morning.   Marland Kitchen HYDROcodone-acetaminophen (NORCO/VICODIN) 5-325 MG per tablet Take 1 tablet by mouth every 8 (eight) hours as needed for pain. For pain  . levothyroxine (SYNTHROID, LEVOTHROID) 50 MCG tablet Take 50 mcg by mouth daily.    . potassium chloride (KLOR-CON) 10 MEQ CR tablet Take 10 mEq by mouth every morning.   . topiramate (TOPAMAX) 25 MG tablet Take 25 mg by mouth 2 (two) times daily. After 2 weeks increase to 2 tablets twice a day  . zafirlukast (ACCOLATE) 20 MG tablet Take 20 mg by mouth 2 (two) times daily.   No facility-administered encounter medications on file as of 09/22/2015.        Pt reports no benefit since taking SAM-E and is interested in [name] prescribing Abilify  as helpful in past.   Sexual History:   History  Sexual Activity  . Sexual Activity: Not on file   Abuse/Trauma History:Pt denies past trauma or abuse. Pt had series of losses beginning w/ lay off in Apr 07, 2003, w/ dad's death following in same year, her home becoming foreclosed on and sister's  suicide in 04/06/2006 and mother's death in 2010/04/06.   Psychiatric History:Pt has been tx for MDD by PCP for several years. Pt is now in tx w/ Dr. Judie Bonus.    Family Med/Psych History:  Family History  Problem Relation Age of Onset  . Depression Sister   . Suicidality Sister   . Bipolar disorder Sister   . Depression Mother     Risk of Suicide/Violence: virtually non-existent Pt denies any SI over 3 years. Pt documented overdose on medications 2 yeas ago prior to becoming stable on current medication regimen. Pt denies any intent or plan.    Impression/DX:  Pt is a 69 y/o  female w/ hx of depression for several years.  Pt initially minimizes depressive symptoms, but does begin to open up and identify chronic depression w current symptoms scoring a 18 on the PHQ9.  Pt has been tried on several medications in the past w/out benefit and is at the upper limit of current antidepressant.  Pt is receptive to counseling to assist in supporting self care and coping in addition to medication to tx depression.  Pt denies any current SI, denies any SA.    Disposition/Plan:  Pt to f/u in 2 weeks for counseling.  Pt to f/u as scheduled w/ Dr. Lolly MustacheArfeen.  Pt informed of TMS tx for depression and to consult w/ Dr. Donell Beersplovsky if would be appropriate tx.  Counselor informed Dr. Donell BeersPlovsky of her want to try Abilify again.    Diagnosis:      Major depressive disorder, recurrent episode, moderate (HCC)   Tamia Dial, LPC

## 2015-10-01 ENCOUNTER — Telehealth (HOSPITAL_COMMUNITY): Payer: Self-pay

## 2015-10-01 MED ORDER — BUPROPION HCL ER (XL) 150 MG PO TB24: ORAL_TABLET | ORAL | Status: DC

## 2015-10-01 NOTE — Telephone Encounter (Signed)
Met with Dr. Casimiro Needle to discuss patient's report the Sam-E was not helping patient and agreed to call patient with instructions to begin Wellbutrin XL 172m, one of 4 days each morning and then to increase to 2 each morning.  Called patient to inform of instructions and verified she has never had a seizure history as she reports no history.  Patient agreed with plan to stop Sam-E and begin Wellbutrin XL 1528mone for 4 days each morning and then increase and remain at 2 each morning.  Patient to call back if any problems with new medication and verbalized back an understanding of all instructions.

## 2015-10-01 NOTE — Telephone Encounter (Signed)
Medication management - Patient reports Sam-E is not helping with her depression at all and would like to discuss other options such as Abilify 2mg  she has taken in the past.  Agreed to speak with Dr. Donell BeersPlovsky about her reported ongoing symptoms of depression not improving with Sam-E and concern would like to try something else.  Will also question of patient a possible candidate for TMS as was mentioned by Forde RadonLeanne Yates, therapist from patient's previous therapy appointment.  Patient's next evaluation set for 12/10/15.

## 2015-10-17 ENCOUNTER — Telehealth (HOSPITAL_COMMUNITY): Payer: Self-pay

## 2015-10-17 NOTE — Telephone Encounter (Signed)
Medication management - Telephone call with patient after she left a message stating the Wellbutrin she was taking now was just too much and was causing her to be shaky and dizzy some.  Patient stated today she only took one Wellbutrin XL 150 mg tablet and things were better today.  Met with Dr. Casimiro Needle who instructed for patient to remain at one a day and relayed that information to patient.  Patient agreed with plan and will keep our office informed if any further problems.

## 2015-10-20 ENCOUNTER — Ambulatory Visit (INDEPENDENT_AMBULATORY_CARE_PROVIDER_SITE_OTHER): Payer: 59 | Admitting: Psychology

## 2015-10-20 DIAGNOSIS — F331 Major depressive disorder, recurrent, moderate: Secondary | ICD-10-CM

## 2015-10-20 NOTE — Progress Notes (Signed)
   THERAPIST PROGRESS NOTE  Session Time: 11.01am-11.57am  Participation Level: Active  Behavioral Response: Well GroomedAlertDepressed  Type of Therapy: Individual Therapy  Treatment Goals addressed: Diagnosis: MDD and goal 1.  Interventions: CBT and Supportive  Summary: Elaine Byrd F Karnik is a 69 y.o. female who presents with affect slightly blunted.  Pt reported that she is taking the Wellbutrin 150 and that she isn't feeling shaky on that dose.  Pt reports she is dealing w/ dry mouth, increased in her tinnitus, and feels more irritable.  Pt however feels that also helping as reports some decrease in loss of interest and has started to get things accomplished.  Pt reports that she currently doesn't have plans for Thanksgiving- but might be joining her exhusbands family w/ her children.  Pt reports that holidays are more difficult as brings up more thoughts of loss of love ones- however pt is able to focus on engaging w/ those still present and awareness of being flexible with plans.  Pt reports getting excited about the birth of her grandson in the spring- but does have worry that her neuropathy and depression won't be improved to assist in caretaking for him.  Pt reports she is focusing on her reading for enjoyment and keeping up w/her knitting the best she can w/ neuropathy.  .   Suicidal/Homicidal: Nowithout intent/plan  Therapist Response: Assessed pt current functioning per pt report.  Explored w/pt her depressed mood and her engagement in activities and responsibilities.  Processed w/pt holidays, losses and interactions w/ family. Encouraged pt focus on self care- connecting w/ others and activities for enjoyment.   Plan: Return again in 3 weeks.  Diagnosis: MDD    Forde RadonYATES,Safari Cinque, [facility] 10/20/2015

## 2015-10-31 ENCOUNTER — Ambulatory Visit (HOSPITAL_COMMUNITY): Payer: Self-pay | Admitting: Psychiatry

## 2015-11-11 ENCOUNTER — Ambulatory Visit (HOSPITAL_COMMUNITY): Payer: Self-pay | Admitting: Psychology

## 2015-12-10 ENCOUNTER — Ambulatory Visit (HOSPITAL_COMMUNITY): Payer: Self-pay | Admitting: Psychiatry

## 2015-12-24 ENCOUNTER — Ambulatory Visit (HOSPITAL_COMMUNITY): Payer: Self-pay | Admitting: Psychiatry

## 2015-12-24 ENCOUNTER — Ambulatory Visit (INDEPENDENT_AMBULATORY_CARE_PROVIDER_SITE_OTHER): Payer: 59 | Admitting: Psychiatry

## 2015-12-24 DIAGNOSIS — F330 Major depressive disorder, recurrent, mild: Secondary | ICD-10-CM | POA: Diagnosis not present

## 2015-12-24 MED ORDER — DULOXETINE HCL 30 MG PO CPEP: ORAL_CAPSULE | ORAL | Status: DC

## 2015-12-24 MED ORDER — CLONAZEPAM 1 MG PO TABS: ORAL_TABLET | ORAL | Status: DC

## 2015-12-24 NOTE — Progress Notes (Signed)
[facility] MD Progress Note  12/24/2015 3:41 PM Elaine Byrd  MRN:  [id number] Subjective: Doing well Principal Problem: Major depression, recurrent episode mild Diagnosis:  Major depression, recurrent episode mild Today the patient says she is doing well. Her daughters have another child  A boy will make her third grandchild. The patient feels positive about things. She is sleeping fairly well. She has interrupted sleep but does not feel sleepy the next day. She is eating fair. She denies the use of alcohol. She enjoys the TV. She is her cell phone to get on the computer all the time. The patient denies the use of any drugs and denies any physical complaints at this time. She continues take Klonopin as prescribed. He try to take some Wellbutrin for description but it made her feel strange. Overall the patient is talkative and outgoing. She denies being suicidal or homicidal. She has no psychosis. Patient Active Problem List   Diagnosis Date Noted  . Major depressive disorder, recurrent episode, moderate (HCC) [F33.1] 01/10/2015  . Tylenol overdose [T39.1X4A] 02/01/2013  . Suicidal ideation [R45.851] 02/01/2013  . Depression [F32.9] 02/01/2013  . Hypothyroidism [E03.9] 02/01/2013  . Asthma [J45.909] 02/01/2013   Total Time spent with patient: 30 minutes  Past Psychiatric History:   Past Medical History:  Past Medical History  Diagnosis Date  . Depression   . Asthma   . Hypothyroidism   . Neuropathy (HCC)   . Acid reflux     Past Surgical History  Procedure Laterality Date  . Neck surgery    . Abdominal hysterectomy    . Cholecystectomy    . Nasal sinus surgery    . Cataract extraction bilateral w/ anterior vitrectomy Bilateral 12/15 and 01/16   Family History:  Family History  Problem Relation Age of Onset  . Depression Sister   . Suicidality Sister   . Bipolar disorder Sister   . Depression Mother    Family Psychiatric  History:  Social History:  History  Alcohol Use  . 0.6  oz/week  . 1 Glasses of wine per week    Comment: maybe 1-3 drink a month     History  Drug Use No    Social History   Social History  . Marital Status: Divorced    Spouse Name: N/A  . Number of Children: N/A  . Years of Education: N/A   Social History Main Topics  . Smoking status: Former Smoker -- 1.00 packs/day    Types: Cigarettes    Quit date: 06/29/1981  . Smokeless tobacco: Never Used  . Alcohol Use: 0.6 oz/week    1 Glasses of wine per week     Comment: maybe 1-3 drink a month  . Drug Use: No  . Sexual Activity: Not on file   Other Topics Concern  . Not on file   Social History Narrative   Additional Social History:                         Sleep: Fair  Appetite:  Poor  Current Medications: Current Outpatient Prescriptions  Medication Sig Dispense Refill  . albuterol (PROVENTIL HFA;VENTOLIN HFA) 108 (90 BASE) MCG/ACT inhaler Inhale 2 puffs into the lungs every 6 (six) hours as needed for wheezing. For wheezing    . budesonide-formoterol (SYMBICORT) 160-4.5 MCG/ACT inhaler Inhale 2 puffs into the lungs 2 (two) times daily.    Marland Kitchen buPROPion (WELLBUTRIN XL) 150 MG 24 hr tablet 1 qam  For 4  days then 2 qam 60 tablet 2  . clonazePAM (KLONOPIN) 1 MG tablet 1 bid 1 prn 90 tablet 3  . DULoxetine (CYMBALTA) 30 MG capsule 3  q pm 90 capsule 5  . fluticasone-salmeterol (ADVAIR HFA) 115-21 MCG/ACT inhaler Inhale 2 puffs into the lungs 2 (two) times daily.      . furosemide (LASIX) 20 MG tablet Take 20 mg by mouth every morning.     Marland Kitchen HYDROcodone-acetaminophen (NORCO/VICODIN) 5-325 MG per tablet Take 1 tablet by mouth every 8 (eight) hours as needed for pain. For pain    . levothyroxine (SYNTHROID, LEVOTHROID) 50 MCG tablet Take 50 mcg by mouth daily.      . potassium chloride (KLOR-CON) 10 MEQ CR tablet Take 10 mEq by mouth every morning.     Marland Kitchen S-Adenosylmethionine (SAM-E) 200 MG TABS Take 200 mg by mouth daily.    Marland Kitchen topiramate (TOPAMAX) 25 MG tablet Take 25 mg  by mouth 2 (two) times daily. After 2 weeks increase to 2 tablets twice a day    . zafirlukast (ACCOLATE) 20 MG tablet Take 20 mg by mouth 2 (two) times daily.     No current facility-administered medications for this visit.    Lab Results: No results found for this or any previous visit (from the past 48 hour(s)).  Physical Findings: AIMS:  , ,  ,  ,    CIWA:    COWS:     Musculoskeletal: Strength & Muscle Tone: within normal limits Gait & Station normal Patient leans: N/A  Psychiatric Specialty Exam: ROS  There were no vitals taken for this visit.There is no weight on file to calculate BMI.  General Appearance: NA and Casual  Eye Contact::  Good  Speech:  Clear and Coherent  Volume:  Normal  Mood:  Euthymic  Affect:  Congruent  Thought Process:  Coherent  Orientation:  Full (Time, Place, and Person)  Thought Content:  WDL  Suicidal Thoughts:  No  Homicidal Thoughts:  No  Memory:  NA  Judgement:  Good  Insight:  Good  Psychomotor Activity:  Normal  Concentration:  Good  Recall:  Good  Fund of Knowledge:Good  Language: Good  Akathisia:  No  Handed:  Right  AIMS (if indicated):     Assets:  Desire for Improvement  ADL's:  Intact  Cognition: WNL  Sleep:      Treatment Plan Summary: At this time the patient will have her Klonopin slightly increased to taking 1 mg twice a day with 1 extra if needed. She'll receive 90 pills. The patient will continue taking Cymbalta 90 mg a day. The patient is doing well physically she is very stable. She denies any chest pain or shortness of breath. She denies any neurological symptoms at this time. This patient to return to see me in 3 months for a 30 minute visit.  Irving Bloor IRVING 12/24/2015, 3:41 PM

## 2016-02-02 DIAGNOSIS — Z789 Other specified health status: Secondary | ICD-10-CM | POA: Insufficient documentation

## 2016-02-02 DIAGNOSIS — K219 Gastro-esophageal reflux disease without esophagitis: Secondary | ICD-10-CM | POA: Insufficient documentation

## 2016-02-02 DIAGNOSIS — F3340 Major depressive disorder, recurrent, in remission, unspecified: Secondary | ICD-10-CM | POA: Insufficient documentation

## 2016-02-02 DIAGNOSIS — G609 Hereditary and idiopathic neuropathy, unspecified: Secondary | ICD-10-CM | POA: Insufficient documentation

## 2016-02-03 ENCOUNTER — Encounter (HOSPITAL_COMMUNITY): Payer: Self-pay | Admitting: Psychology

## 2016-02-17 DIAGNOSIS — H524 Presbyopia: Secondary | ICD-10-CM | POA: Insufficient documentation

## 2016-02-17 DIAGNOSIS — Z79899 Other long term (current) drug therapy: Secondary | ICD-10-CM | POA: Insufficient documentation

## 2016-02-17 DIAGNOSIS — H16223 Keratoconjunctivitis sicca, not specified as Sjogren's, bilateral: Secondary | ICD-10-CM | POA: Insufficient documentation

## 2016-02-17 DIAGNOSIS — Z961 Presence of intraocular lens: Secondary | ICD-10-CM | POA: Insufficient documentation

## 2016-03-16 DIAGNOSIS — G259 Extrapyramidal and movement disorder, unspecified: Secondary | ICD-10-CM | POA: Insufficient documentation

## 2016-03-24 ENCOUNTER — Ambulatory Visit (HOSPITAL_COMMUNITY): Payer: Self-pay | Admitting: Psychiatry

## 2016-04-01 ENCOUNTER — Encounter (HOSPITAL_COMMUNITY): Payer: Self-pay | Admitting: Psychology

## 2016-04-01 DIAGNOSIS — F331 Major depressive disorder, recurrent, moderate: Secondary | ICD-10-CM

## 2016-04-01 NOTE — Progress Notes (Signed)
Elaine Byrd is a 70 y.o. female patient being discharged from counseling as last seen on 10/20/15.  Outpatient Therapist Discharge Summary  [facility]    02/15/1946   Admission Date: 09/22/15   Discharge Date:  04/01/16 Reason for Discharge:  Not active Diagnosis:    Major depressive disorder, recurrent episode, moderate (HCC)    Comments:  Pt didn't respond to 02/03/16 letter inquiring about f/u.   Alfredo BattyLeanne M Yates           YATES,LEANNE, LPC

## 2016-04-11 DIAGNOSIS — F411 Generalized anxiety disorder: Secondary | ICD-10-CM | POA: Insufficient documentation

## 2016-04-11 DIAGNOSIS — E876 Hypokalemia: Secondary | ICD-10-CM | POA: Insufficient documentation

## 2016-06-15 DIAGNOSIS — R160 Hepatomegaly, not elsewhere classified: Secondary | ICD-10-CM | POA: Insufficient documentation

## 2016-06-15 DIAGNOSIS — I10 Essential (primary) hypertension: Secondary | ICD-10-CM | POA: Insufficient documentation

## 2016-06-15 DIAGNOSIS — R631 Polydipsia: Secondary | ICD-10-CM | POA: Insufficient documentation

## 2016-09-16 DIAGNOSIS — Z13820 Encounter for screening for osteoporosis: Secondary | ICD-10-CM | POA: Insufficient documentation

## 2017-01-18 DIAGNOSIS — E781 Pure hyperglyceridemia: Secondary | ICD-10-CM | POA: Insufficient documentation

## 2017-05-15 ENCOUNTER — Encounter (HOSPITAL_BASED_OUTPATIENT_CLINIC_OR_DEPARTMENT_OTHER): Payer: Self-pay | Admitting: *Deleted

## 2017-05-15 ENCOUNTER — Emergency Department (HOSPITAL_BASED_OUTPATIENT_CLINIC_OR_DEPARTMENT_OTHER)
Admission: EM | Admit: 2017-05-15 | Discharge: 2017-05-15 | Disposition: A | Discharge status: Home / Self Care | Payer: Medicare Other | Attending: Emergency Medicine | Admitting: Emergency Medicine

## 2017-05-15 ENCOUNTER — Emergency Department (HOSPITAL_BASED_OUTPATIENT_CLINIC_OR_DEPARTMENT_OTHER): Payer: Medicare Other

## 2017-05-15 DIAGNOSIS — Z79899 Other long term (current) drug therapy: Secondary | ICD-10-CM | POA: Insufficient documentation

## 2017-05-15 DIAGNOSIS — J45909 Unspecified asthma, uncomplicated: Secondary | ICD-10-CM | POA: Insufficient documentation

## 2017-05-15 DIAGNOSIS — F17210 Nicotine dependence, cigarettes, uncomplicated: Secondary | ICD-10-CM | POA: Insufficient documentation

## 2017-05-15 DIAGNOSIS — M79671 Pain in right foot: Secondary | ICD-10-CM | POA: Diagnosis not present

## 2017-05-15 DIAGNOSIS — R110 Nausea: Secondary | ICD-10-CM | POA: Diagnosis not present

## 2017-05-15 DIAGNOSIS — E039 Hypothyroidism, unspecified: Secondary | ICD-10-CM | POA: Diagnosis not present

## 2017-05-15 MED ORDER — TRAMADOL HCL 50 MG PO TABS: 50.0000 mg | ORAL_TABLET | Freq: Four times a day (QID) | ORAL | 0 refills | Status: DC | PRN

## 2017-05-15 NOTE — ED Notes (Signed)
Pt stats she has neuropathy and that certain medications "agree" with it while others do not. She was placed on Pravastatin 20 mg 3 weeks ago and began to notice changes with her right foot about a week after that. Increased redness and swelling as well as increased difficulty walking noted by pt and family member. Right foot swollen with a reddened area noted to medial aspect of the base of the right great toe. +dpp palp. 3+ pitting edema noted as well.

## 2017-05-15 NOTE — ED Triage Notes (Signed)
Pt reports that she went on pravastatin 1 month ago, took it for 10 days and then took herself off of it.  Reports right foot swelling, redness in foot x several days.  States that it is improved.  Pedal pulses 2+, cap refill 3-4sec.

## 2017-05-15 NOTE — Discharge Instructions (Signed)
Take pain medication as needed. Follow-up with PCP for further evaluate as soon as possible. Return to ED for numbness, weakness, injury, increased swelling in leg, temperature changes or fevers.

## 2017-05-15 NOTE — ED Notes (Signed)
Pt given rx x 1 for tramadol. Pt d/c home with ride

## 2017-05-15 NOTE — ED Provider Notes (Signed)
[facility] Provider Note   CSN: [id number] Arrival date & time: 05/15/17  1628  By signing my name below, I, Doreatha MartinEva Mathews, attest that this documentation has been prepared under the direction and in the presence of Lili Harts, PA-C. Electronically Signed: Doreatha MartinEva Mathews, ED Scribe. 05/15/17. 5:22 PM.    History   Chief Complaint Chief Complaint  Patient presents with  . Foot Injury    HPI Elaine Byrd is a 71 y.o. female with h/o peripheral neuropathy who presents to the Emergency Department complaining of progressively worsening, constant right foot pain, "stiffness" and swelling that began 3 weeks ago. Per son, pt started a new medication, Pravastatin, 3 weeks ago that seemed to significantly worsen her baseline b/l foot pain and swelling (R>L). No other recent medication changes, trauma, falls or injury. She reports she d/c'ed the medication after a week, but her symptoms have continued to worsen to the point that she finds it difficult to walk and feels nauseated as her pain increases. She also reports she has noticed some "skin breakdown" and redness near her great toe. She is not on any blood thinners. She states she has since worn loose socks and applied diaper cream to the area. She reports her pain is worsened with weight bearing and ambulation. She is on 40 mg furosemide daily and states it does not seem to be alleviating her current symptoms. No h/o of similar symptoms, PE/DVT, recent long travel. She denies fever, skin changes elsewhere, vomiting, abdominal pain, CP, SOB,hemoptysis, wheezing.   The history is provided by the patient. No language interpreter was used.    Past Medical History:  Diagnosis Date  . Acid reflux   . Asthma   . Depression   . Hypothyroidism   . Neuropathy     Patient Active Problem List   Diagnosis Date Noted  . Major depressive disorder, recurrent episode, moderate (HCC) 01/10/2015  . Tylenol overdose 02/01/2013  . Suicidal ideation  02/01/2013  . Depression 02/01/2013  . Hypothyroidism 02/01/2013  . Asthma 02/01/2013    Past Surgical History:  Procedure Laterality Date  . ABDOMINAL HYSTERECTOMY    . CATARACT EXTRACTION BILATERAL W/ ANTERIOR VITRECTOMY Bilateral 12/15 and 01/16  . CHOLECYSTECTOMY    . NASAL SINUS SURGERY    . NECK SURGERY      OB History    No data available       Home Medications    Prior to Admission medications   Medication Sig Start Date End Date Taking? Authorizing Provider  albuterol (PROVENTIL HFA;VENTOLIN HFA) 108 (90 BASE) MCG/ACT inhaler Inhale 2 puffs into the lungs every 6 (six) hours as needed for wheezing. For wheezing    [provider]  budesonide-formoterol (SYMBICORT) 160-4.5 MCG/ACT inhaler Inhale 2 puffs into the lungs 2 (two) times daily.    [provider]  clonazePAM Scarlette Calico(KLONOPIN) 1 MG tablet 1 bid 1 prn 12/24/15   Plovsky, Earvin HansenGerald, MD  fluticasone-salmeterol (ADVAIR HFA) 115-21 MCG/ACT inhaler Inhale 2 puffs into the lungs 2 (two) times daily.      [provider]  furosemide (LASIX) 20 MG tablet Take 20 mg by mouth every morning.     [provider]  traMADol (ULTRAM) 50 MG tablet Take 1 tablet (50 mg total) by mouth every 6 (six) hours as needed. 05/15/17   Dietrich PatesKhatri, Trayquan Kolakowski, PA-C    Family History Family History  Problem Relation Age of Onset  . Depression Sister   . Suicidality Sister   .  Bipolar disorder Sister   . Depression Mother     Social History Social History  Substance Use Topics  . Smoking status: Former Smoker    Packs/day: 1.00    Types: Cigarettes    Quit date: 06/29/1981  . Smokeless tobacco: Never Used  . Alcohol use 0.6 oz/week    1 Glasses of wine per week     Comment: maybe 1-3 drink a month     Allergies   Morphine and related; Penicillins; and Sulfa antibiotics   Review of Systems Review of Systems  Constitutional: Negative for fever.  Respiratory: Negative for shortness of breath and wheezing.     Cardiovascular: Positive for leg swelling. Negative for chest pain.  Gastrointestinal: Positive for nausea. Negative for abdominal pain and vomiting.  Musculoskeletal: Positive for arthralgias.  Skin: Positive for color change (redness).  All other systems reviewed and are negative.   Physical Exam Updated Vital Signs BP 125/78 (BP Location: Right Arm)   Pulse 74   Temp 98.1 F (36.7 C) (Oral)   Resp 16   Ht 5\' 2"  (1.575 m)   Wt 86.2 kg (190 lb)   SpO2 97%   BMI 34.75 kg/m   Physical Exam  Constitutional: She appears well-developed and well-nourished. No distress.  HENT:  Head: Normocephalic and atraumatic.  Nose: Nose normal.  Eyes: Conjunctivae and EOM are normal. Left eye exhibits no discharge. No scleral icterus.  Neck: Normal range of motion. Neck supple.  Cardiovascular: Normal rate, regular rhythm, normal heart sounds and intact distal pulses.  Exam reveals no gallop and no friction rub.   No murmur heard. Pulmonary/Chest: Effort normal and breath sounds normal. No respiratory distress. She has no wheezes. She has no rales.  Abdominal: Soft. Bowel sounds are normal. She exhibits no distension. There is no tenderness. There is no guarding.  Musculoskeletal: Normal range of motion. She exhibits edema.  Pitting edema on the dorsum of the right foot. No palpable temperature change. Mild TTP of big toe but no exquisite tenderness. Good movement of the digits. Negative calf tenderness bilaterally.  Neurological: She is alert. No sensory deficit. She exhibits normal muscle tone. Coordination normal.  Good strength with plantar and dorsiflexion. 5/5 strength. Sensation intact to bilateral lower extremities. DP pulses 2+.   Skin: Skin is warm and dry. No rash noted. She is not diaphoretic.  Psychiatric: She has a normal mood and affect.  Nursing note and vitals reviewed.      ED Treatments / Results   DIAGNOSTIC STUDIES: Oxygen Saturation is 100% on RA, normal by my  interpretation.    COORDINATION OF CARE: 5:12 PM Discussed treatment plan with pt at bedside which includes XR and pt agreed to plan.    Labs (all labs ordered are listed, but only abnormal results are displayed) Labs Reviewed - No data to display  EKG  EKG Interpretation None       Radiology Dg Foot Complete Right  Result Date: 05/15/2017 CLINICAL DATA:  Right first toe pain and swelling. No reported injury. EXAM: RIGHT FOOT COMPLETE - 3+ VIEW COMPARISON:  None. FINDINGS: Diffuse right foot soft tissue swelling, most prominent in the dorsal distal right foot. No fracture or dislocation. No suspicious focal osseous lesion. No cortical erosions or periosteal reaction. No soft tissue gas. Degenerative changes at the first metatarsophalangeal joint and at the tarsometatarsal joints. Small plantar right calcaneal spur. Vascular calcifications throughout the soft tissues. No radiopaque foreign body. IMPRESSION: 1. Diffuse right foot soft tissue  swelling, most prominent in the dorsal distal right foot. 2. No fracture, dislocation or specific radiographic findings of osteomyelitis. 3. Polyarticular osteoarthritis as detailed. Electronically Signed   By: Delbert Phenix M.D.   On: 05/15/2017 18:02    Procedures Procedures (including critical care time)  Medications Ordered in ED Medications - No data to display   Initial Impression / Assessment and Plan / ED Course  I have reviewed the triage vital signs and the nursing notes.  Pertinent labs & imaging results that were available during my care of the patient were reviewed by me and considered in my medical decision making (see chart for details).     Patient presents to ED for R big toe pain and swelling of dorsum of foot. No h/o injury or trauma to area. No temperature changes noted. Full ROM of toes although some pain with flexion of big toe. No history of gout or signs of trauma/inoculation to area. No calf tenderness or extended leg  swelling noted. Xray showed diffuse soft tissue swelling and sesamoid bones in area of big toe. No fracture, dislocation or radiographic findings of osteomyelitis. Low suspicion for septic joint or clot considering no temperature changes and good strength and ROM. Some TTP of toe although no exquisite tenderness present. Patient afebrile with no history of fever. No signs of this causing systemic illness. She is otherwise resting comfortably on bed. No hemoptysis, chest pain, hypoxia or palpitations suspicious for PE. I advised patient to follow up with PCP for further evaluation, as this could be due to arthritis. Patient and son request that she needs to be given something for pain. Patient (jokingly) asked for Dilaudid. I informed her of our narcotic policy. I advised her to continue Tylenol but will give tramadol to help until she is seen by PCP. Offered crutches but patient declined. No need for further imaging or workup at this time. She denies any other complaints. Strict return precautions given.  Final Clinical Impressions(s) / ED Diagnoses   Final diagnoses:  Right foot pain    New Prescriptions Discharge Medication List as of 05/15/2017  6:33 PM    START taking these medications   Details  traMADol (ULTRAM) 50 MG tablet Take 1 tablet (50 mg total) by mouth every 6 (six) hours as needed., Starting Sun 05/15/2017, Print        I personally performed the services described in this documentation, which was scribed in my presence. The recorded information has been reviewed and is accurate.       Dietrich Pates, PA-C 05/16/17 1401    Nira Conn, MD 05/18/17 [phone]

## 2017-05-20 DIAGNOSIS — R531 Weakness: Secondary | ICD-10-CM | POA: Insufficient documentation

## 2017-05-20 DIAGNOSIS — M79674 Pain in right toe(s): Secondary | ICD-10-CM | POA: Insufficient documentation

## 2017-06-14 DIAGNOSIS — M109 Gout, unspecified: Secondary | ICD-10-CM | POA: Insufficient documentation

## 2018-04-12 DIAGNOSIS — M791 Myalgia, unspecified site: Secondary | ICD-10-CM | POA: Insufficient documentation

## 2018-05-17 DIAGNOSIS — E661 Drug-induced obesity: Secondary | ICD-10-CM | POA: Insufficient documentation

## 2019-01-29 ENCOUNTER — Ambulatory Visit (INDEPENDENT_AMBULATORY_CARE_PROVIDER_SITE_OTHER): Payer: Medicare HMO | Admitting: Pediatrics

## 2019-01-29 ENCOUNTER — Encounter: Payer: Self-pay | Admitting: Pediatrics

## 2019-01-29 VITALS — BP 142/86 | HR 98 | Temp 98.0°F | Resp 20 | Ht 62.8 in | Wt 186.4 lb

## 2019-01-29 DIAGNOSIS — L2089 Other atopic dermatitis: Secondary | ICD-10-CM | POA: Insufficient documentation

## 2019-01-29 DIAGNOSIS — L209 Atopic dermatitis, unspecified: Secondary | ICD-10-CM | POA: Insufficient documentation

## 2019-01-29 DIAGNOSIS — J4540 Moderate persistent asthma, uncomplicated: Secondary | ICD-10-CM

## 2019-01-29 DIAGNOSIS — J301 Allergic rhinitis due to pollen: Secondary | ICD-10-CM | POA: Diagnosis not present

## 2019-01-29 DIAGNOSIS — T63441D Toxic effect of venom of bees, accidental (unintentional), subsequent encounter: Secondary | ICD-10-CM | POA: Diagnosis not present

## 2019-01-29 DIAGNOSIS — K219 Gastro-esophageal reflux disease without esophagitis: Secondary | ICD-10-CM | POA: Insufficient documentation

## 2019-01-29 DIAGNOSIS — T63441A Toxic effect of venom of bees, accidental (unintentional), initial encounter: Secondary | ICD-10-CM | POA: Insufficient documentation

## 2019-01-29 DIAGNOSIS — J302 Other seasonal allergic rhinitis: Secondary | ICD-10-CM | POA: Insufficient documentation

## 2019-01-29 DIAGNOSIS — J3089 Other allergic rhinitis: Secondary | ICD-10-CM | POA: Insufficient documentation

## 2019-01-29 DIAGNOSIS — J3489 Other specified disorders of nose and nasal sinuses: Secondary | ICD-10-CM | POA: Insufficient documentation

## 2019-01-29 DIAGNOSIS — L2084 Intrinsic (allergic) eczema: Secondary | ICD-10-CM | POA: Insufficient documentation

## 2019-01-29 DIAGNOSIS — G629 Polyneuropathy, unspecified: Secondary | ICD-10-CM | POA: Insufficient documentation

## 2019-01-29 MED ORDER — MONTELUKAST SODIUM 10 MG PO TABS: ORAL_TABLET | ORAL | 5 refills | Status: DC

## 2019-01-29 MED ORDER — EPINEPHRINE 0.3 MG/0.3ML IJ SOAJ: 0.3000 mg | Freq: Once | INTRAMUSCULAR | 1 refills | Status: AC

## 2019-01-29 MED ORDER — FLUTICASONE PROPIONATE 50 MCG/ACT NA SUSP: NASAL | 5 refills | Status: DC

## 2019-01-29 MED ORDER — TRIAMCINOLONE ACETONIDE 0.1 % EX CREA: TOPICAL_CREAM | CUTANEOUS | 3 refills | Status: DC

## 2019-01-29 NOTE — Patient Instructions (Addendum)
Environmental control of dust mite  Zyrtec 10 mg-take 1 tablet in the morning for runny nose or itchy eyes. Hydroxyzine 25 mg-take 1 tablet at night for itching Polysporin ointment twice a day in each nostril for 1 week Fluticasone 2 sprays per nostril once a day if needed for stuffy nose.  Aim towards the eye on the same side.  Start next week Montelukast 10 mg-take 1 tablet once a day to prevent coughing or wheezing Symbicort 160-2 puffs every 12 hours to prevent coughing or wheezing Ventolin 2 puffs every 4 hours if needed for coughing or wheezing.  You may use Ventolin 2 puffs 5 to 15 minutes before exercise Triamcinolone 0.1% cream twice a day if needed to red itchy areas, below the face Continue on your other medications Call us if you are not doing well on this treatment plan Stop the herbal supplements for now  I will call you with the results of your lab work for insect allergy and severe asthma .  If you have an insect sting , take Benadryl 50 mg every 4 hours and if you have life-threatening symptoms inject with EpiPen 0.3 mg. We will apply with your insurance company for the use of dupilumab because of your severe asthma and eczema

## 2019-01-29 NOTE — Progress Notes (Signed)
[address] [place] [address] Dept: [phone]  New Patient Note  Patient ID: Elaine Byrd, female    DOB: [birth year]  Age: 73 y.o. MRN: [id number] Date of Office Visit: 01/29/2019 Referring provider: Laqueta Due., MD [address] [place], [place] [address]    Chief Complaint: Dermatitis  HPI [name] presents for evaluation of a rash which has been pruritic.  She was hospitalized in January for about 4 days because of this itchy rash complicated with cellulitis.  She has had episodes of this itchy rash in the past.  She has had asthma for 22 years.  She has a history of seasonal allergic rhinitis.  She has aggravation of her symptoms on exposure to dust, cigarette smoke cats and rabbit rabbits.  She has had sinus surgery twice including stripping of her membranes.  She has had correction of a deviated nasal septum twice.  She had to take prednisone last fall twice because of asthma.  She has been on prednisone for 24 days since January.  Her last dose was 4 days ago.. She has gastroesophageal reflux but does not take pantoprazole on a daily basis She has a neuropathy and has used numerous over-the-counter herbal supplements off and on.  Several years ago following an insect sting she has some large local swelling and shortness of breath .  She showed me pictures of her previous rash and it appears to be consistent with atopic dermatitis with superinfection in her arms and legs  Review of Systems  Constitutional: Negative.   HENT:       Seasonal allergic rhinitis for many years .  Sinus surgery twice in the past and nasal septal deviation surgery twice  Eyes:       Lens implants  Respiratory:       Asthma for 22  Years.  Pneumonia  once  Cardiovascular:       Peripheral edema  Gastrointestinal:       Cholecystectomy and gastroesophageal reflux  Genitourinary:       Total hysterectomy  Musculoskeletal:       History of cervical and lumbar laminectomies    Skin:       Dermatitis with cellulitis requiring hospitalization in January of this year  Neurological:       Peripheral neuropathy in her legs  Endo/Heme/Allergies:       Low thyroid function.  No diabetes.  Several years ago following an insect sting she had large local swelling and shortness of breath  Psychiatric/Behavioral: Negative.     Outpatient Encounter Medications as of 01/29/2019  Medication Sig  . acetaminophen (TYLENOL) 500 MG tablet Take 500 mg by mouth every 6 (six) hours as needed.  Marland Kitchen albuterol (PROVENTIL HFA;VENTOLIN HFA) 108 (90 BASE) MCG/ACT inhaler Inhale 2 puffs into the lungs every 6 (six) hours as needed for wheezing. For wheezing  . B Complex-Biotin-FA (B COMPLETE PO) Take 50 mg by mouth daily.  Marland Kitchen Bioflavonoid Products (ESTER-C) 500-550 MG TABS Take by mouth.  . budesonide-formoterol (SYMBICORT) 160-4.5 MCG/ACT inhaler Inhale 2 puffs into the lungs 2 (two) times daily.  . clonazePAM (KLONOPIN) 1 MG tablet 1 bid 1 prn  . co-enzyme Q-10 30 MG capsule Take 100 mg by mouth daily.  . Cyanocobalamin (B-12) 2500 MCG TABS Take by mouth.  . furosemide (LASIX) 20 MG tablet Take 20 mg by mouth every morning.   . hydrOXYzine (ATARAX/VISTARIL) 25 MG tablet Take 25 mg by mouth 3 (three) times  daily as needed.  . Lactobacillus-Inulin (CULTURELLE DIGESTIVE HEALTH PO) Take by mouth.  . levothyroxine (SYNTHROID, LEVOTHROID) 100 MCG tablet Take 100 mcg by mouth daily before breakfast.  . loratadine-pseudoephedrine (CLARITIN-D 24-HOUR) 10-240 MG 24 hr tablet Take 1 tablet by mouth daily.  . Methylsulfonylmethane (MSM) 1000 MG CAPS Take by mouth.  . Multiple Vitamins-Minerals (MULTIVITAMIN ADULT) TABS Take by mouth.  . pantoprazole (PROTONIX) 40 MG tablet Take 40 mg by mouth daily.  . Selenium 200 MCG CAPS Take by mouth.  . Vitamin D-Vitamin K (VITAMIN K2-VITAMIN D3 PO) Take 5,000 mg by mouth daily.  Marland Kitchen zinc gluconate 50 MG tablet Take 50 mg by mouth daily.  Marland Kitchen EPINEPHrine 0.3 mg/0.3  mL IJ SOAJ injection Inject 0.3 mLs (0.3 mg total) into the muscle once for 1 dose.  . fluticasone (FLONASE) 50 MCG/ACT nasal spray 2 sprays per nostril once a day if needed for stuffy nose  . montelukast (SINGULAIR) 10 MG tablet Take 1 tablet once a day to prevent coughing or wheezing  . triamcinolone cream (KENALOG) 0.1 % Apply twice a day to red itchy areas below the face  . [DISCONTINUED] fluticasone-salmeterol (ADVAIR HFA) 115-21 MCG/ACT inhaler Inhale 2 puffs into the lungs 2 (two) times daily.    . [DISCONTINUED] traMADol (ULTRAM) 50 MG tablet Take 1 tablet (50 mg total) by mouth every 6 (six) hours as needed.   No facility-administered encounter medications on file as of 01/29/2019.      Drug Allergies:  Allergies  Allergen Reactions  . Morphine And Related Other (See Comments)    hallucinations  . Penicillins Other (See Comments)    Childhood reaction, cannot remember  . Sulfa Antibiotics Other (See Comments)    itchiness    Family History: Rennie's family history includes Allergic rhinitis in her mother; Bipolar disorder in her sister; Depression in her mother and sister; Suicidality in her sister..  Family history is positive for asthma.  Family history is negative for sinus problems, angioedema, eczema, hives, food allergies chronic bronchitis or emphysema.  Social and environmental.  They have a snake at home.  She is not exposed to cigarette smoking.  She quit smoking 39 years ago after smoking cigarettes for 16 years averaging around a pack a day.  She used to work in call centers.  Physical Exam: BP (!) 142/86   Pulse 98   Temp 98 F (36.7 C) (Oral)   Resp 20   Ht 5' 2.8" (1.595 m)   Wt 186 lb 6.4 oz (84.6 kg)   SpO2 96%   BMI 33.23 kg/m    Physical Exam Vitals signs reviewed.  Constitutional:      Appearance: She is obese.     Comments: Cushingoid facies  HENT:     Ears:     Comments: Eyes normal.  Ears normal.  Nose moderate swelling of nasal turbinates with  an anterior septal perforation with very light yellow mucus at edges Pharynx normal. Neck:     Musculoskeletal: Neck supple.     Comments: No thyromegaly Cardiovascular:     Comments: S1-S2 normal no murmurs Pulmonary:     Comments: Clear to percussion and auscultation Abdominal:     Palpations: Abdomen is soft.     Tenderness: There is no abdominal tenderness.     Comments: No hepatosplenomegaly  Lymphadenopathy:     Cervical: No cervical adenopathy.  Skin:    Comments: Few areas with mild papulosquamous eruption on her arms and legs  Neurological:  General: No focal deficit present.     Mental Status: She is alert and oriented to person, place, and time.  Psychiatric:        Mood and Affect: Mood normal.        Behavior: Behavior normal.        Thought Content: Thought content normal.        Judgment: Judgment normal.     Diagnostics: FVC 1.49 L FEV1 1.20 L.  Predicted FVC 2.87 L predicted FEV1 2.17 L.  After albuterol 2 puffs FVC 1.62 L FEV1 1.14 L- this shows a moderate reduction in the forced vital capacity with no significant improvement after albuterol  Allergy skin test were extremely positive to grass pollens, weed pollen , tree pollens, dust mite, cat.  Slight reactions noted to some molds.  Skin testing to foods was negative   Assessment  Assessment and Plan: 1. Moderate persistent asthma without complication   2. Toxic effect of venom of bees, unintentional, subsequent encounter   3. Flexural atopic dermatitis   4. Seasonal allergic rhinitis due to pollen   5. Nasal septal perforation   6. Gastroesophageal reflux disease without esophagitis   7. Neuropathy     Meds ordered this encounter  Medications  . fluticasone (FLONASE) 50 MCG/ACT nasal spray    Sig: 2 sprays per nostril once a day if needed for stuffy nose    Dispense:  18.2 g    Refill:  5  . montelukast (SINGULAIR) 10 MG tablet    Sig: Take 1 tablet once a day to prevent coughing or wheezing      Dispense:  30 tablet    Refill:  5  . EPINEPHrine 0.3 mg/0.3 mL IJ SOAJ injection    Sig: Inject 0.3 mLs (0.3 mg total) into the muscle once for 1 dose.    Dispense:  0.3 mL    Refill:  1    Pt will call  . triamcinolone cream (KENALOG) 0.1 %    Sig: Apply twice a day to red itchy areas below the face    Dispense:  45 g    Refill:  3    Patient Instructions  Environmental control of dust mite  Zyrtec 10 mg-take 1 tablet in the morning for runny nose or itchy eyes. Hydroxyzine 25 mg-take 1 tablet at night for itching Polysporin ointment twice a day in each nostril for 1 week Fluticasone 2 sprays per nostril once a day if needed for stuffy nose.  Aim towards the eye on the same side.  Start next week Montelukast 10 mg-take 1 tablet once a day to prevent coughing or wheezing Symbicort 160-2 puffs every 12 hours to prevent coughing or wheezing Ventolin 2 puffs every 4 hours if needed for coughing or wheezing.  You may use Ventolin 2 puffs 5 to 15 minutes before exercise Triamcinolone 0.1% cream twice a day if needed to red itchy areas, below the face Continue on your other medications Call us if you are not doing well on this treatment plan Stop the herbal supplements for now  I will call you with the results of your lab work for insect allergy and severe asthma .  If you have an insect sting , take Benadryl 50 mg every 4 hours and if you have life-threatening symptoms inject with EpiPen 0.3 mg. We will apply with your insurance company for the use of dupilumab because of your severe asthma and eczema    Return in about 4 weeks (around  02/26/2019).   Thank you for the opportunity to care for this patient.  Please do not hesitate to contact me with questions.  Tonette Bihari, M.D.  [facility] [address] [place], [place] [address] [phone]

## 2019-02-01 LAB — CBC WITH DIFFERENTIAL/PLATELET
BASOS ABS: 0.1 10*3/uL (ref 0.0–0.2)
Basos: 1 %
EOS (ABSOLUTE): 0.5 10*3/uL — AB (ref 0.0–0.4)
Eos: 4 %
Hematocrit: 41.2 % (ref 34.0–46.6)
Hemoglobin: 14.4 g/dL (ref 11.1–15.9)
IMMATURE GRANULOCYTES: 1 %
Immature Grans (Abs): 0.1 10*3/uL (ref 0.0–0.1)
Lymphocytes Absolute: 2.9 10*3/uL (ref 0.7–3.1)
Lymphs: 27 %
MCH: 34.7 pg — ABNORMAL HIGH (ref 26.6–33.0)
MCHC: 35 g/dL (ref 31.5–35.7)
MCV: 99 fL — AB (ref 79–97)
MONOS ABS: 0.8 10*3/uL (ref 0.1–0.9)
Monocytes: 7 %
Neutrophils Absolute: 6.2 10*3/uL (ref 1.4–7.0)
Neutrophils: 60 %
PLATELETS: 371 10*3/uL (ref 150–450)
RBC: 4.15 x10E6/uL (ref 3.77–5.28)
RDW: 11.9 % (ref 11.7–15.4)
WBC: 10.5 10*3/uL (ref 3.4–10.8)

## 2019-02-01 LAB — ANCA TITERS
Atypical pANCA: <1:20 {titer}
C-ANCA: <1:20 {titer}
P-ANCA: <1:20 {titer}

## 2019-02-01 LAB — ALLERGEN HYMENOPTERA PANEL
Bumblebee: <0.1 kU/L
Honeybee IgE: <0.1 kU/L
Hornet, White Face, IgE: <0.1 kU/L
Hornet, Yellow, IgE: <0.1 kU/L
Yellow Jacket, IgE: <0.1 kU/L

## 2019-02-01 LAB — ALLERGEN A FUMIGATUS IGG: ASPERGILLUS FUMIGATUS IGG: 84.9 ug/mL — AB (ref 0.0–1.9)

## 2019-02-01 LAB — ALPHA-1 ANTITRYPSIN PHENOTYPE: A-1 Antitrypsin: 116 mg/dL (ref 101–187)

## 2019-02-01 LAB — M003-IGE ASPERGILLUS FUMIGATUS: Aspergillus Fumigatus IgE: <0.1 kU/L

## 2019-02-01 LAB — IGE: IGE (IMMUNOGLOBULIN E), SERUM: 49 [IU]/mL (ref 6–495)

## 2019-02-28 NOTE — Telephone Encounter (Signed)
Can you please let this patient know that I am contacting Tammy to try to work through this. Thank you

## 2019-02-28 NOTE — Telephone Encounter (Signed)
Thank you. In the meantime, Tammy has gotten this approved. She will reach out to this patient.

## 2019-03-02 NOTE — Telephone Encounter (Signed)
PT called back returing a call from [place]. Advised PT it was probably Tammy calling in regards to the Dupixent order. Gave PT Tammy's phone # [phone] per [facility].

## 2019-03-06 ENCOUNTER — Ambulatory Visit: Payer: Medicare Other | Admitting: Pediatrics

## 2019-03-21 DIAGNOSIS — J4550 Severe persistent asthma, uncomplicated: Secondary | ICD-10-CM | POA: Insufficient documentation

## 2019-03-29 ENCOUNTER — Telehealth: Payer: Self-pay | Admitting: Pediatrics

## 2019-03-29 NOTE — Telephone Encounter (Signed)
Called [employer] and she has been approved can expect a call from nurse or she can contact them to set up delivery

## 2019-03-29 NOTE — Telephone Encounter (Signed)
Review note taken by [place] regarding dupixent.  Thanks tammy.

## 2019-03-29 NOTE — Telephone Encounter (Signed)
PT called because she received a denial letter from [employer]. Cost is $247 for dupixent which she cannot afford. Denial: "you do not meet program coverage requirements for medicare part d." PT requesting alternative med. Also, the Atarax, one nightly does not help with itching. Shes been taking 3 daily.

## 2019-03-29 NOTE — Telephone Encounter (Signed)
I spoke to patient and discussed that we are still working on getting her approved for PAP through [employer] and the that letter was sent  Last week when I hd called them to find out they needed something else from patient to get approved.  She did send that in to them and they were still processing her app so I will call and find out status and let her know.

## 2019-03-29 NOTE — Telephone Encounter (Signed)
Spoke to pt. She will call this evening or first thing tomorrow morning to schedule dupixent delivery once delivered to our office will call pt. To schedule time to come in to receive and teach pt. How to administer the dupixent to herself.

## 2019-04-04 ENCOUNTER — Ambulatory Visit (INDEPENDENT_AMBULATORY_CARE_PROVIDER_SITE_OTHER): Payer: Medicare HMO

## 2019-04-04 ENCOUNTER — Other Ambulatory Visit: Payer: Self-pay

## 2019-04-04 DIAGNOSIS — L209 Atopic dermatitis, unspecified: Secondary | ICD-10-CM | POA: Diagnosis not present

## 2019-04-04 MED ORDER — DUPILUMAB 300 MG/2ML ~~LOC~~ SOSY
300.0000 mg | PREFILLED_SYRINGE | Freq: Once | SUBCUTANEOUS | Status: AC
  Administered 2019-04-04: 300 mg via SUBCUTANEOUS

## 2019-04-05 ENCOUNTER — Ambulatory Visit: Payer: Medicare HMO

## 2019-04-17 ENCOUNTER — Ambulatory Visit (INDEPENDENT_AMBULATORY_CARE_PROVIDER_SITE_OTHER): Payer: Medicare HMO | Admitting: Pediatrics

## 2019-04-17 ENCOUNTER — Encounter: Payer: Self-pay | Admitting: Pediatrics

## 2019-04-17 ENCOUNTER — Other Ambulatory Visit: Payer: Self-pay

## 2019-04-17 ENCOUNTER — Ambulatory Visit: Payer: Medicare HMO | Admitting: Pediatrics

## 2019-04-17 DIAGNOSIS — J4540 Moderate persistent asthma, uncomplicated: Secondary | ICD-10-CM | POA: Insufficient documentation

## 2019-04-17 DIAGNOSIS — L209 Atopic dermatitis, unspecified: Secondary | ICD-10-CM

## 2019-04-17 DIAGNOSIS — K219 Gastro-esophageal reflux disease without esophagitis: Secondary | ICD-10-CM

## 2019-04-17 DIAGNOSIS — T63441D Toxic effect of venom of bees, accidental (unintentional), subsequent encounter: Secondary | ICD-10-CM

## 2019-04-17 DIAGNOSIS — J301 Allergic rhinitis due to pollen: Secondary | ICD-10-CM

## 2019-04-17 NOTE — Patient Instructions (Addendum)
Moderate persistent asthma without complication Continue montelukast 10 mg once a day to prevent cough or wheeze Continue Symbicort 160-2 puffs twice a day to prevent cough and wheeze Continue Ventolin 2 puffs every 4 hours as needed for cough or wheeze Continue Dupixent 300 mg subcutaneous injections every other week  Seasonal allergic rhinitis due to pollen Begin Claritin 10 mg once a day as needed for a runny nose. We discourage Claritin D as this can raise your blood pressure Continue Flonase 1-2 sprays in each nostril once a day as needed for a stuffy nose Consider saline nasal rinses once or twice a day (NeilMed is one example). Use this before any medicated nasal sprays  Atopic dermatitis Continue a daily moisturizing routine Continue triamcinolone 0.1% cream twice a day as needed to red, itchy areas below your face  Gastroesophageal reflux disease without esophagitis Continue taking pantoprazole 40 mg once a day as previously prescribed Continue dietary and lifestyle modifications as listed below  Toxic effect of venom of bees Your lab tests were negative for sensitivity to stinging insects. Continue to avoid stinging insects at this time and carry an EpiPen. In case of an allergic reaction, give Benadryl 50 mg every 4 hours, and if life-threatening symptoms occur, inject with EpiPen 0.3 mg. If you are interested, we will get you on the list for venom testing.  Call the clinic if this treatment plan is not working well for you  Follow up in 2 months or sooner if needed

## 2019-04-17 NOTE — Progress Notes (Addendum)
RE: Elaine Byrd MRN: [id number] DOB: 21-Nov-1946 Date of Telemedicine Visit: 04/17/2019  Referring provider: Laqueta Due., MD Primary care provider: Laqueta Due., MD  Chief Complaint: Asthma   Telemedicine Follow Up Visit via Telephone: I connected with Elaine Byrd for a follow up on 04/17/19 by telephone and verified that I am speaking with the correct person using two identifiers.   I discussed the limitations, risks, security and privacy concerns of performing an evaluation and management service by telephone and the availability of in person appointments. I also discussed with the patient that there may be a patient responsible charge related to this service. The patient expressed understanding and agreed to proceed.  Patient is at home Provider is at the office.  Visit start time: 2:30 Visit end time: 3:27 Insurance consent/check in by: Jennette Banker Medical consent and medical assistant/nurse: Maryjean Morn  History of Present Illness: She is a 73 y.o. female, who is being followed for asthma, atopic dermatitis, allergic rhinitis, reflux, and allergy to insect stings . Her previous allergy office visit was on 01/29/2019 with Dr. Beaulah Dinning. At today's visit, she reports her asthma control depends on the day, however, it has been a little better than last month with symptoms including shortness of breath with activity and when she is emotional, wheezing in the morning and if napping, and a dry cough once a day. She continues montelukast 10 mg once a day, Symbicort 160-2 puffs twice a day, and albuterol once to several times a day. She received her first Dupixent injection in this office on Apr 04, 2019 with no adverse reaction. She has her second Dupixent injection at her home and is prepared to give this injection to herself tomorrow.  Allergic rhinitis is reported as moderately well controlled with nasal congestion as the main symptom for which she is taking Claritin D 12 hour tablets twice a  day and using Flonase once a day. She is not using a nasal saline rinse. Atopic dermatitis and pruritus are reported as moderately well controlled with phytoplex lotion, triamcinolone 0.01% cream and hydroxyzine 25 mg three times a day. She denies daytime drowsiness while taking hydroxyzine. Reflux is reported as well controlled with pantoprazole 40 mg once a day. Her current medications are listed in the chart.   Assessment and Plan: Moderate persistent asthma without complication Continue montelukast 10 mg once a day to prevent cough or wheeze Continue Symbicort 160-2 puffs twice a day to prevent cough and wheeze Continue Ventolin 2 puffs every 4 hours as needed for cough or wheeze Continue Dupixent 300 mg subcutaneous injections every other week  Seasonal allergic rhinitis due to pollen Begin Claritin 10 mg once a day as needed for a runny nose. We discourage Claritin D as this can raise your blood pressure Continue Flonase 1-2 sprays in each nostril once a day as needed for a stuffy nose Consider saline nasal rinses once or twice a day (NeilMed is one example). Use this before any medicated nasal sprays  Atopic dermatitis Continue a daily moisturizing routine Continue triamcinolone 0.1% cream twice a day as needed to red, itchy areas below your face Continue hydroxyzine 25 mg three times a day as needed for itch. This medication may make you sleepy.   Gastroesophageal reflux disease without esophagitis Continue taking pantoprazole 40 mg once a day as previously prescribed Continue dietary and lifestyle modifications as listed below  Toxic effect of venom of bees Your lab tests were negative for sensitivity to stinging  insects. Continue to avoid stinging insects at this time and carry an EpiPen. In case of an allergic reaction, give Benadryl 50 mg every 4 hours, and if life-threatening symptoms occur, inject with EpiPen 0.3 mg. If you are interested, we will get you on the list for venom  testing.  Call the clinic if this treatment plan is not working well for you  Follow up in 2 months or sooner if needed  Return in about 2 months (around 06/17/2019), or if symptoms worsen or fail to improve.    Medication List:  Current Outpatient Medications  Medication Sig Dispense Refill  . acetaminophen (TYLENOL) 500 MG tablet Take 500 mg by mouth every 6 (six) hours as needed.    Marland Kitchen albuterol (PROVENTIL HFA;VENTOLIN HFA) 108 (90 BASE) MCG/ACT inhaler Inhale 2 puffs into the lungs every 6 (six) hours as needed for wheezing. For wheezing    . B Complex-Biotin-FA (B COMPLETE PO) Take 50 mg by mouth daily.    Marland Kitchen Bioflavonoid Products (ESTER-C) 500-550 MG TABS Take by mouth.    . budesonide-formoterol (SYMBICORT) 160-4.5 MCG/ACT inhaler Inhale 2 puffs into the lungs 2 (two) times daily.    . clonazePAM (KLONOPIN) 1 MG tablet 1 bid 1 prn (Patient taking differently: Take 1 mg by mouth 3 (three) times daily as needed. 1 bid 1 prn) 90 tablet 3  . co-enzyme Q-10 30 MG capsule Take 100 mg by mouth daily.    . Cyanocobalamin (B-12) 2500 MCG TABS Take by mouth.    . EPINEPHrine 0.3 mg/0.3 mL IJ SOAJ injection INJECT 0.3ML (0.3MG ) INTO THE MUSCLE ONCE FOR 1 DOSE    . fluticasone (FLONASE) 50 MCG/ACT nasal spray 2 sprays per nostril once a day if needed for stuffy nose 18.2 g 5  . furosemide (LASIX) 20 MG tablet Take 20 mg by mouth every morning.     . hydrOXYzine (ATARAX/VISTARIL) 25 MG tablet Take 25 mg by mouth 3 (three) times daily.     . Lactobacillus-Inulin (CULTURELLE DIGESTIVE HEALTH PO) Take by mouth.    . levothyroxine (SYNTHROID, LEVOTHROID) 100 MCG tablet Take 100 mcg by mouth daily before breakfast.    . loratadine-pseudoephedrine (CLARITIN-D 24-HOUR) 10-240 MG 24 hr tablet Take 1 tablet by mouth daily.    . Methylsulfonylmethane (MSM) 1000 MG CAPS Take by mouth.    . montelukast (SINGULAIR) 10 MG tablet Take 1 tablet once a day to prevent coughing or wheezing 30 tablet 5  . Multiple  Vitamins-Minerals (MULTIVITAMIN ADULT) TABS Take by mouth.    . pantoprazole (PROTONIX) 40 MG tablet Take 40 mg by mouth daily.    . Selenium 200 MCG CAPS Take by mouth.    . triamcinolone cream (KENALOG) 0.1 % Apply twice a day to red itchy areas below the face 45 g 3  . Vitamin D-Vitamin K (VITAMIN K2-VITAMIN D3 PO) Take 5,000 mg by mouth daily.    Marland Kitchen zinc gluconate 50 MG tablet Take 50 mg by mouth daily.     No current facility-administered medications for this visit.    Allergies: Allergies  Allergen Reactions  . Morphine And Related Other (See Comments)    hallucinations  . Penicillins Other (See Comments)    Childhood reaction, cannot remember  . Sulfa Antibiotics Other (See Comments)    itchiness   I reviewed her past medical history, social history, family history, and environmental history and no significant changes have been reported from previous visit on 01/29/2019.  Objective: Physical Exam Not obtained as  encounter was done via telephone.   Previous notes and tests were reviewed.  I discussed the assessment and treatment plan with the patient. The patient was provided an opportunity to ask questions and all were answered. The patient agreed with the plan and demonstrated an understanding of the instructions.   The patient was advised to call back or seek an in-person evaluation if the symptoms worsen or if the condition fails to improve as anticipated.  I provided 57 minutes of non-face-to-face time during this encounter.  It was my pleasure to participate in Elaine Byrd's care today. Please feel free to contact me with any questions or concerns.   Sincerely,  Thermon LeylandAnne Ambs, FNP   I have provided oversight concerning Thermon Leylandnne Ambs' evaluation and treatment of this patient's health issues addressed during today's encounter. I agree with the assessment and therapeutic plan as outlined in the note.   Thank you for the opportunity to care for this patient.  Please do not  hesitate to contact me with questions.  Tonette BihariJ. A. Bardelas, M.D.  [facility] [address] [place], [place] [id number] [phone]

## 2019-04-19 ENCOUNTER — Other Ambulatory Visit: Payer: Self-pay | Admitting: *Deleted

## 2019-04-19 MED ORDER — FLUTICASONE PROPIONATE 50 MCG/ACT NA SUSP: NASAL | 3 refills | Status: DC

## 2019-04-20 ENCOUNTER — Other Ambulatory Visit: Payer: Self-pay

## 2019-04-20 MED ORDER — HYDROXYZINE HCL 25 MG PO TABS: 25.0000 mg | ORAL_TABLET | Freq: Three times a day (TID) | ORAL | 4 refills | Status: DC

## 2019-04-24 ENCOUNTER — Other Ambulatory Visit: Payer: Self-pay

## 2019-04-24 MED ORDER — HYDROXYZINE HCL 25 MG PO TABS: 25.0000 mg | ORAL_TABLET | Freq: Three times a day (TID) | ORAL | 4 refills | Status: DC

## 2019-04-25 ENCOUNTER — Other Ambulatory Visit: Payer: Self-pay | Admitting: *Deleted

## 2019-04-25 MED ORDER — HYDROXYZINE HCL 25 MG PO TABS: 25.0000 mg | ORAL_TABLET | Freq: Three times a day (TID) | ORAL | 3 refills | Status: AC

## 2019-04-25 MED ORDER — MONTELUKAST SODIUM 10 MG PO TABS: ORAL_TABLET | ORAL | 3 refills | Status: DC

## 2019-05-28 ENCOUNTER — Telehealth: Payer: Self-pay | Admitting: *Deleted

## 2019-05-28 MED ORDER — LORATADINE-PSEUDOEPHEDRINE ER 10-240 MG PO TB24: 1.0000 | ORAL_TABLET | Freq: Every day | ORAL | 3 refills | Status: DC

## 2019-05-28 NOTE — Telephone Encounter (Signed)
Okay   Agree with plan

## 2019-05-28 NOTE — Telephone Encounter (Signed)
Pt called saying she had a reaction 10 days after administering her 3rd Dupixent injection at home. On Friday it became a large 2in x 1 in red, itchy hive with stinging on injection site on her right abdomin. She has been using benadryl cream and her eczema cream. Today it is better- the stinging and itching is gone and is just a little pink. Instructed her to try taking her antihistamine before the injection. Her next injection will be due July 1st and she will let us know if she has a reaction next time.

## 2019-06-18 ENCOUNTER — Ambulatory Visit (INDEPENDENT_AMBULATORY_CARE_PROVIDER_SITE_OTHER): Payer: Medicare HMO | Admitting: Family Medicine

## 2019-06-18 ENCOUNTER — Other Ambulatory Visit: Payer: Self-pay

## 2019-06-18 ENCOUNTER — Encounter: Payer: Self-pay | Admitting: Family Medicine

## 2019-06-18 VITALS — BP 126/80 | HR 96 | Temp 98.2°F | Resp 24 | Ht 62.0 in | Wt 185.0 lb

## 2019-06-18 DIAGNOSIS — K219 Gastro-esophageal reflux disease without esophagitis: Secondary | ICD-10-CM | POA: Diagnosis not present

## 2019-06-18 DIAGNOSIS — J4540 Moderate persistent asthma, uncomplicated: Secondary | ICD-10-CM | POA: Diagnosis not present

## 2019-06-18 DIAGNOSIS — T63441D Toxic effect of venom of bees, accidental (unintentional), subsequent encounter: Secondary | ICD-10-CM | POA: Diagnosis not present

## 2019-06-18 DIAGNOSIS — J301 Allergic rhinitis due to pollen: Secondary | ICD-10-CM

## 2019-06-18 DIAGNOSIS — L209 Atopic dermatitis, unspecified: Secondary | ICD-10-CM

## 2019-06-18 MED ORDER — AZELASTINE HCL 0.1 % NA SOLN: NASAL | 5 refills | Status: DC

## 2019-06-18 NOTE — Patient Instructions (Signed)
Moderate persistent asthma without complication Continue montelukast 10 mg once a day to prevent cough or wheeze Continue Symbicort 160-2 puffs twice a day to prevent cough and wheeze Continue Ventolin 2 puffs every 4 hours as needed for cough or wheeze Continue Dupixent 300 mg subcutaneous injections every other week  Seasonal allergic rhinitis due to pollen Begin azelastine 2 sprays in each nostril twice a day as needed for a runny nose or sneezing Continue Claritin 10 mg once a day as needed for a runny nose. We discourage Claritin D as this can raise your blood pressure Continue Flonase 1-2 sprays in each nostril once a day as needed for a stuffy nose Consider saline nasal rinses once or twice a day (NeilMed is one example). Use this before any medicated nasal sprays  Atopic dermatitis Continue a daily moisturizing routine Continue triamcinolone 0.1% cream twice a day as needed to red, itchy areas below your face  Gastroesophageal reflux disease without esophagitis Continue taking pantoprazole 40 mg once a day as previously prescribed Continue dietary and lifestyle modifications   Toxic effect of venom of bees Your lab tests were negative for sensitivity to stinging insects. Continue to avoid stinging insects at this time and carry an EpiPen. In case of an allergic reaction, give Benadryl 50 mg every 4 hours, and if life-threatening symptoms occur, inject with EpiPen 0.3 mg. We will get you on the schedule for venom testing  Call the clinic if this treatment plan is not working well for you  Follow up in 3 months or sooner if needed

## 2019-06-18 NOTE — Progress Notes (Signed)
[address] [place] [id number] Dept: [phone]  FOLLOW UP NOTE  Patient ID: Elaine LeversErin F Croson, female    DOB: 03/02/1946  Age: 73 y.o. MRN: [id number] Date of Office Visit: 06/18/2019  Assessment  Chief Complaint: Asthma  HPI Elaine Byrd is a 73 year old female who presents to the clinic for a follow up visit. She was last seen on 04/17/2019 via televisit in which she reported that her asthma symptoms were beginning to improve. At today's visit, she reports that her asthma has been more controlled with occasional shortness of breath and wheezing with activity and 1-2 days a week she experiences a dry hacking cough. She continues Symbicort 160-2 puffs twice a day with a spacer, montelukast 10 mg once a day and uses albuterol 1 time a day which is a significant decrease since her last visit. She continues Dupixent injections 300 mg every other week and reports a significant improvement in her asthma symptoms while remaining on Dupixent. Allergic rhinitis is reported as not well controlled with clear runny nose and sneezing. She is using Claritin and Flonase as needed. Atopic dermatitis and pruritus is reported as much more controlled with Dupixent. She reports a significant decrease in her symptoms of atopic dermatitis while continuing on Dupixent. Reflux is reported as well controlled with pantoprazole once a day. She continues to avoid stinging insects and has access to an epinephrine device at all times. Her current medications are listed in the chart.   Drug Allergies:  Allergies  Allergen Reactions  . Morphine And Related Other (See Comments)    hallucinations  . Penicillins Other (See Comments)    Childhood reaction, cannot remember  . Sulfa Antibiotics Other (See Comments)    itchiness    Physical Exam: BP 126/80 (BP Location: Left Arm, Patient Position: Sitting, Cuff Size: Normal)   Pulse 96   Temp 98.2 F (36.8 C) (Oral)   Resp (!) 24   Ht 5\' 2"  (1.575 m)   Wt 184 lb  15.5 oz (83.9 kg)   SpO2 98%   BMI 33.83 kg/m    Physical Exam Vitals signs reviewed.  Constitutional:      Appearance: Normal appearance.  HENT:     Head: Normocephalic and atraumatic.     Right Ear: Tympanic membrane normal.     Left Ear: Tympanic membrane normal.     Nose:     Comments: Bilateral nares slightly erythematous with clear nasal drainage noted. Pharynx normal. Ears normal. Eyes normal.    Mouth/Throat:     Pharynx: Oropharynx is clear.  Eyes:     Conjunctiva/sclera: Conjunctivae normal.  Neck:     Musculoskeletal: Normal range of motion and neck supple.  Cardiovascular:     Rate and Rhythm: Normal rate and regular rhythm.     Heart sounds: Normal heart sounds. No murmur.  Pulmonary:     Effort: Pulmonary effort is normal.     Breath sounds: Normal breath sounds.     Comments: Lungs clear to auscultation Musculoskeletal: Normal range of motion.  Skin:    General: Skin is warm and dry.  Neurological:     Mental Status: She is alert and oriented to person, place, and time.  Psychiatric:        Mood and Affect: Mood normal.        Behavior: Behavior normal.        Thought Content: Thought content normal.        Judgment: Judgment normal.  Diagnostics: FVC 1.74, FEV1 1.33. Predicted FVC 2.73, predicted FEV1 2.06. Spirometry indicates moderate restriction. This is consistent with the previous spirometry reading.  Assessment and Plan: 1. Moderate persistent asthma without complication   2. Gastroesophageal reflux disease without esophagitis   3. Seasonal allergic rhinitis due to pollen   4. Toxic effect of venom of bees, unintentional, subsequent encounter   5. Atopic dermatitis, unspecified type     Meds ordered this encounter  Medications  . azelastine (ASTELIN) 0.1 % nasal spray    Sig: 1-2 sprays per nostril twice daily for runny nose.    Dispense:  30 mL    Refill:  5    Patient Instructions  Moderate persistent asthma without complication  Continue montelukast 10 mg once a day to prevent cough or wheeze Continue Symbicort 160-2 puffs twice a day to prevent cough and wheeze Continue Ventolin 2 puffs every 4 hours as needed for cough or wheeze Continue Dupixent 300 mg subcutaneous injections every other week  Seasonal allergic rhinitis due to pollen Begin azelastine 2 sprays in each nostril twice a day as needed for a runny nose or sneezing Continue Claritin 10 mg once a day as needed for a runny nose. We discourage Claritin D as this can raise your blood pressure Continue Flonase 1-2 sprays in each nostril once a day as needed for a stuffy nose Consider saline nasal rinses once or twice a day (NeilMed is one example). Use this before any medicated nasal sprays  Atopic dermatitis Continue a daily moisturizing routine Continue triamcinolone 0.1% cream twice a day as needed to red, itchy areas below your face  Gastroesophageal reflux disease without esophagitis Continue taking pantoprazole 40 mg once a day as previously prescribed Continue dietary and lifestyle modifications   Toxic effect of venom of bees Your lab tests were negative for sensitivity to stinging insects. Continue to avoid stinging insects at this time and carry an EpiPen. In case of an allergic reaction, give Benadryl 50 mg every 4 hours, and if life-threatening symptoms occur, inject with EpiPen 0.3 mg. We will get you on the schedule for venom testing  Call the clinic if this treatment plan is not working well for you  Follow up in 3 months or sooner if needed   Return in about 3 months (around 09/18/2019), or if symptoms worsen or fail to improve.   Thank you for the opportunity to care for this patient.  Please do not hesitate to contact me with questions.  Gareth Morgan, FNP [facility]  I have provided oversight concerning Gareth Morgan' evaluation and treatment of this patient's health issues  addressed during today's encounter. I agree with the assessment and therapeutic plan as outlined in the note.   Thank you for the opportunity to care for this patient.  Please do not hesitate to contact me with questions.  Penne Lash, M.D.  [facility] [address] [place], [place] [address] [phone]

## 2019-09-18 ENCOUNTER — Encounter: Payer: Self-pay | Admitting: Family Medicine

## 2019-09-18 ENCOUNTER — Ambulatory Visit (INDEPENDENT_AMBULATORY_CARE_PROVIDER_SITE_OTHER): Payer: Medicare HMO | Admitting: Family Medicine

## 2019-09-18 ENCOUNTER — Other Ambulatory Visit: Payer: Self-pay

## 2019-09-18 VITALS — BP 126/86 | HR 104 | Temp 98.2°F | Resp 16

## 2019-09-18 DIAGNOSIS — K219 Gastro-esophageal reflux disease without esophagitis: Secondary | ICD-10-CM | POA: Diagnosis not present

## 2019-09-18 DIAGNOSIS — L2084 Intrinsic (allergic) eczema: Secondary | ICD-10-CM | POA: Diagnosis not present

## 2019-09-18 DIAGNOSIS — J301 Allergic rhinitis due to pollen: Secondary | ICD-10-CM | POA: Diagnosis not present

## 2019-09-18 DIAGNOSIS — J4540 Moderate persistent asthma, uncomplicated: Secondary | ICD-10-CM

## 2019-09-18 DIAGNOSIS — T63441D Toxic effect of venom of bees, accidental (unintentional), subsequent encounter: Secondary | ICD-10-CM

## 2019-09-18 MED ORDER — ALBUTEROL SULFATE HFA 108 (90 BASE) MCG/ACT IN AERS: 2.0000 | INHALATION_SPRAY | RESPIRATORY_TRACT | 1 refills | Status: DC | PRN

## 2019-09-18 NOTE — Patient Instructions (Addendum)
Moderate persistent asthma without complication Continue montelukast 10 mg once a day to prevent cough or wheeze Continue Symbicort 160-2 puffs twice a day to prevent cough and wheeze.  Begin using a spacer with your inhalers Continue Ventolin 2 puffs every 4 hours as needed for cough or wheeze Continue Dupixent 300 mg subcutaneous injections every other week  Seasonal allergic rhinitis due to pollen Continue azelastine 2 sprays in each nostril twice a day as needed for a runny nose or sneezing Continue Claritin 10 mg once a day as needed for a runny nose. We discourage Claritin D as this can raise your blood pressure Continue Flonase 1-2 sprays in each nostril once a day as needed for a stuffy nose Consider saline nasal rinses once or twice a day (NeilMed is one example). Use this before any medicated nasal sprays  Atopic dermatitis Continue a daily moisturizing routine Continue triamcinolone 0.1% cream twice a day as needed to red, itchy areas below your face  Gastroesophageal reflux Continue taking pantoprazole 40 mg once a day as previously prescribed Continue dietary and lifestyle modifications   Toxic effect of venom of bees Continue to avoid stinging insects at this time and carry an EpiPen. In case of an allergic reaction, give Benadryl 50 mg every 4 hours, and if life-threatening symptoms occur, inject with EpiPen 0.3 mg. We will get you on the schedule for venom testing  Call the clinic if this treatment plan is not working well for you  Follow up in 6 months or sooner if needed

## 2019-09-18 NOTE — Progress Notes (Signed)
[address] [place] [address] Dept: [phone]  FOLLOW UP NOTE  Patient ID: Elaine Byrd, female    DOB: 26-Feb-1946  Age: 73 y.o. MRN: [id number] Date of Office Visit: 09/18/2019  Assessment  Chief Complaint: Asthma  HPI Elaine Byrd is a 73 year old female who presents to the clinic for follow-up visit.  She reports her asthma is moderately well controlled with continuous shortness of breath at her baseline, occasional wheeze that is resolved by albuterol, and dry cough occurring about 2 times a day.  She continues Symbicort 160-2 puffs twice a day, and albuterol 1-2 times a day.  She has not using albuterol before exercise at this time.  Allergic rhinitis is reported as moderately well controlled with clear rhinorrhea, nasal congestion, and occasional drainage for which she is using Claritin daily.  She is not willing to use Flonase, azelastine, or saline nasal spray.  Atopic dermatitis is reported as " a whole lot better" since beginning [place].  She continues to use a daily moisturizer and triamcinolone as needed.  Reflux is reported as well controlled with daily pantoprazole.  She continues to avoid stinging insects and has not had any incidences since her last visit nor has she needed to use her epinephrine since her last visit to this clinic.  Her current medications are listed in the chart.   Drug Allergies:  Allergies  Allergen Reactions  . Morphine And Related Other (See Comments)    hallucinations  . Penicillins Other (See Comments)    Childhood reaction, cannot remember  . Sulfa Antibiotics Other (See Comments)    itchiness    Physical Exam: BP 126/86 (BP Location: Left Arm, Patient Position: Sitting, Cuff Size: Normal)   Pulse (!) 104   Temp 98.2 F (36.8 C) (Oral)   Resp 16   SpO2 96%    Physical Exam Vitals signs reviewed.  Constitutional:      Appearance: Normal appearance.  HENT:     Head: Normocephalic and atraumatic.     Right Ear: Tympanic  membrane normal.     Left Ear: Tympanic membrane normal.     Nose:     Comments: Bilateral nares slightly erythematous with clear nasal drainage noted.  Pharynx normal.  Ears normal.  Eyes normal.    Mouth/Throat:     Pharynx: Oropharynx is clear.  Eyes:     Conjunctiva/sclera: Conjunctivae normal.  Neck:     Musculoskeletal: Normal range of motion and neck supple.  Cardiovascular:     Rate and Rhythm: Normal rate and regular rhythm.     Heart sounds: Normal heart sounds. No murmur.  Pulmonary:     Effort: Pulmonary effort is normal.     Breath sounds: Normal breath sounds.     Comments: Lungs clear to auscultation Musculoskeletal: Normal range of motion.  Skin:    General: Skin is warm and dry.     Comments: Scattered scabs on bilateral forearms with no open areas or drainage noted  Neurological:     Mental Status: She is alert and oriented to person, place, and time.  Psychiatric:        Mood and Affect: Mood normal.        Behavior: Behavior normal.        Thought Content: Thought content normal.        Judgment: Judgment normal.     Diagnostics: FVC 1.69, FEV1 1.34.  Predicted FVC 2.70, predicted FEV1 2.03.  Spirometry indicates moderate restriction.  Spirometry is  consistent with previous readings.  Assessment and Plan: 1. Moderate persistent asthma without complication   2. Seasonal allergic rhinitis due to pollen   3. Intrinsic atopic dermatitis   4. Gastroesophageal reflux disease without esophagitis   5. Toxic effect of venom of bees, unintentional, subsequent encounter     Meds ordered this encounter  Medications  . albuterol (VENTOLIN HFA) 108 (90 Base) MCG/ACT inhaler    Sig: Inhale 2 puffs into the lungs every 4 (four) hours as needed for wheezing or shortness of breath.    Dispense:  18 g    Refill:  1    Please keep rx on hold. Pt. Will call when needed.    Patient Instructions  Moderate persistent asthma without complication Continue montelukast 10  mg once a day to prevent cough or wheeze Continue Symbicort 160-2 puffs twice a day to prevent cough and wheeze.  Begin using a spacer with your inhalers Continue Ventolin 2 puffs every 4 hours as needed for cough or wheeze Continue Dupixent 300 mg subcutaneous injections every other week  Seasonal allergic rhinitis due to pollen Continue azelastine 2 sprays in each nostril twice a day as needed for a runny nose or sneezing Continue Claritin 10 mg once a day as needed for a runny nose. We discourage Claritin D as this can raise your blood pressure Continue Flonase 1-2 sprays in each nostril once a day as needed for a stuffy nose Consider saline nasal rinses once or twice a day (NeilMed is one example). Use this before any medicated nasal sprays  Atopic dermatitis Continue a daily moisturizing routine Continue triamcinolone 0.1% cream twice a day as needed to red, itchy areas below your face  Gastroesophageal reflux Continue taking pantoprazole 40 mg once a day as previously prescribed Continue dietary and lifestyle modifications   Toxic effect of venom of bees Continue to avoid stinging insects at this time and carry an EpiPen. In case of an allergic reaction, give Benadryl 50 mg every 4 hours, and if life-threatening symptoms occur, inject with EpiPen 0.3 mg. We will get you on the schedule for venom testing  Call the clinic if this treatment plan is not working well for you  Follow up in 6 months or sooner if needed   Return in about 6 months (around 03/18/2020), or if symptoms worsen or fail to improve.   Thank you for the opportunity to care for this patient.  Please do not hesitate to contact me with questions.  Thermon Leyland, FNP [facility]  I have provided oversight concerning Thermon Leyland' evaluation and treatment of this patient's health issues addressed during today's encounter. I agree with the assessment and  therapeutic plan as outlined in the note.   Thank you for the opportunity to care for this patient.  Please do not hesitate to contact me with questions.  Tonette Bihari, M.D.  [facility] [address] [place], [place] [address] [phone]

## 2019-11-01 DIAGNOSIS — S4991XA Unspecified injury of right shoulder and upper arm, initial encounter: Secondary | ICD-10-CM | POA: Insufficient documentation

## 2019-11-27 ENCOUNTER — Other Ambulatory Visit: Payer: Self-pay

## 2019-11-27 ENCOUNTER — Encounter: Payer: Self-pay | Admitting: Pediatrics

## 2019-11-27 ENCOUNTER — Ambulatory Visit (INDEPENDENT_AMBULATORY_CARE_PROVIDER_SITE_OTHER): Payer: Medicare HMO | Admitting: Pediatrics

## 2019-11-27 VITALS — BP 120/78 | HR 82 | Temp 97.7°F | Resp 18

## 2019-11-27 DIAGNOSIS — T63481D Toxic effect of venom of other arthropod, accidental (unintentional), subsequent encounter: Secondary | ICD-10-CM

## 2019-11-27 DIAGNOSIS — J4540 Moderate persistent asthma, uncomplicated: Secondary | ICD-10-CM

## 2019-11-27 DIAGNOSIS — L2084 Intrinsic (allergic) eczema: Secondary | ICD-10-CM

## 2019-11-27 NOTE — Progress Notes (Signed)
  [address] [place] [address] Dept: [phone]  FOLLOW UP NOTE  Patient ID: Elaine Byrd, female    DOB: [birth year]  Age: 73 y.o. MRN: [id number] Date of Office Visit: 11/27/2019  Assessment  Chief Complaint: Allergy Testing (bee venom testing) and Medication Problem (wants doctor to be aware since starting Dupixent, patient c/o excessive sleepiness, eye and facial nerve twitching and diarrhea.)  HPI Elaine Byrd presents for testing to Hymenoptera venoms.  Over 20 years ago she had an insect sting and developed large local swelling, itching and shortness of breath.  She did not have asthma at that time.  She has had an EpiPen to use since then in case of an insect sting.  She has not had subsequent stings.   Drug Allergies:  Allergies  Allergen Reactions  . Morphine And Related Other (See Comments)    hallucinations  . Penicillins Other (See Comments)    Childhood reaction, cannot remember  . Sulfa Antibiotics Other (See Comments)    itchiness    Physical Exam: BP 120/78 (BP Location: Left Arm, Patient Position: Sitting, Cuff Size: Large)   Pulse 82   Temp 97.7 F (36.5 C) (Oral)   Resp 18   SpO2 97%    Physical Exam Vitals reviewed.  Constitutional:      Appearance: Normal appearance. She is obese.  HENT:     Head:     Comments: Eyes normal.  Ears normal.  Nose normal.  Pharynx normal. Cardiovascular:     Rate and Rhythm: Normal rate and regular rhythm.     Comments: S1-S2 normal no murmurs Pulmonary:     Comments: Clear to percussion and auscultation Musculoskeletal:     Cervical back: Neck supple.  Lymphadenopathy:     Cervical: No cervical adenopathy.  Skin:    Comments: Clear  Neurological:     General: No focal deficit present.     Mental Status: She is alert and oriented to person, place, and time. Mental status is at baseline.  Psychiatric:        Mood and Affect: Mood normal.        Behavior: Behavior normal.        Thought Content:  Thought content normal.        Judgment: Judgment normal.     Diagnostics: Allergy skin testing was negative to honeybee, yellow jacket, yellow hornet, white hornet and wasp venoms.  Assessment and Plan: 1. Allergic reaction to insect sting, accidental or unintentional, subsequent encounter   2. Moderate persistent asthma without complication   3. Intrinsic atopic dermatitis     No orders of the defined types were placed in this encounter.   Patient Instructions  If you have an insect sting take Benadryl 50 mg every 4 hours and if you have life-threatening symptoms inject with EpiPen 0.3 mg  Claritin  10 mg-take 1 tablet once a day if needed for runny nose or itchy eyes, or itching  Continue on your other medications Call us if you are not doing well on this treatment plan Keep your follow-up appointment with Webb Silversmith in April     Return in about 6 months (around 05/27/2020).    Thank you for the opportunity to care for this patient.  Please do not hesitate to contact me with questions.  Penne Lash, M.D.  [facility] [address] [place], [place] [address] [phone]

## 2019-11-27 NOTE — Patient Instructions (Addendum)
If you have an insect sting take Benadryl 50 mg every 4 hours and if you have life-threatening symptoms inject with EpiPen 0.3 mg  Claritin  10 mg-take 1 tablet once a day if needed for runny nose or itchy eyes, or itching  Continue on your other medications Call us if you are not doing well on this treatment plan Keep your follow-up appointment with Webb Silversmith in April

## 2019-12-06 DIAGNOSIS — M75121 Complete rotator cuff tear or rupture of right shoulder, not specified as traumatic: Secondary | ICD-10-CM | POA: Insufficient documentation

## 2019-12-14 ENCOUNTER — Telehealth: Payer: Self-pay | Admitting: *Deleted

## 2019-12-14 NOTE — Telephone Encounter (Signed)
Called and L/M for patient that I would fax app to [employer] to have her fill out and bring her SSI benefit info and new [employer] card to send to them at [facility]

## 2019-12-14 NOTE — Telephone Encounter (Signed)
-----   Message from [place] sent at 12/12/2019  4:20 PM EST ----- Regarding: dupixent financial assistance PT is needing financial assistance for dupixent. She has her last syringe to use this Friday 1/15. PT says dupixent rep was sending assistance application but she has not received it in over 2 weeks. Do you have the forms for her to fill out? Shanae [phone]

## 2019-12-18 HISTORY — PX: ROTATOR CUFF REPAIR: SHX139

## 2019-12-20 ENCOUNTER — Other Ambulatory Visit: Payer: Self-pay | Admitting: Family Medicine

## 2019-12-30 ENCOUNTER — Encounter: Payer: Self-pay | Admitting: Family Medicine

## 2019-12-31 ENCOUNTER — Telehealth: Payer: Self-pay

## 2019-12-31 NOTE — Telephone Encounter (Signed)
Hogan, Chiquitta F  Ambs, Anne M, FNP 9 hours ago (10:54 PM)   Good morning.  I have not received any communication from [employer] regarding the request for financial assistance.  Can I get a prescription for a medication I can afford? Thank you   Please advise

## 2019-12-31 NOTE — Telephone Encounter (Signed)
Patient came by a couple weeks ago and filled out paperwork but I did not get same. Talked to [facility] and will try to track down same.  I did confirm with patient she signed the paperwork

## 2020-01-10 NOTE — Telephone Encounter (Signed)
Got paperwork and faxed to [facility]

## 2020-02-12 ENCOUNTER — Telehealth: Payer: Self-pay | Admitting: Pediatrics

## 2020-02-12 NOTE — Telephone Encounter (Signed)
I received refill request and already faxed back

## 2020-02-12 NOTE — Telephone Encounter (Signed)
Routed to Tammy. Patients last OV was 11/27/19.

## 2020-02-12 NOTE — Telephone Encounter (Signed)
PT says she received letter from dupixent stating they need more information from dr bardelas to approve her dupixent.

## 2020-02-12 NOTE — Telephone Encounter (Signed)
LM advising patient  °

## 2020-02-27 ENCOUNTER — Other Ambulatory Visit: Payer: Self-pay

## 2020-02-27 MED ORDER — MONTELUKAST SODIUM 10 MG PO TABS: ORAL_TABLET | ORAL | 1 refills | Status: DC

## 2020-03-19 ENCOUNTER — Ambulatory Visit (INDEPENDENT_AMBULATORY_CARE_PROVIDER_SITE_OTHER): Payer: Medicare HMO | Admitting: Family Medicine

## 2020-03-19 ENCOUNTER — Encounter: Payer: Self-pay | Admitting: Family Medicine

## 2020-03-19 ENCOUNTER — Other Ambulatory Visit: Payer: Self-pay

## 2020-03-19 VITALS — Ht 62.0 in | Wt 194.0 lb

## 2020-03-19 DIAGNOSIS — L2084 Intrinsic (allergic) eczema: Secondary | ICD-10-CM

## 2020-03-19 DIAGNOSIS — J301 Allergic rhinitis due to pollen: Secondary | ICD-10-CM

## 2020-03-19 DIAGNOSIS — T63441D Toxic effect of venom of bees, accidental (unintentional), subsequent encounter: Secondary | ICD-10-CM

## 2020-03-19 DIAGNOSIS — J4540 Moderate persistent asthma, uncomplicated: Secondary | ICD-10-CM | POA: Insufficient documentation

## 2020-03-19 DIAGNOSIS — K219 Gastro-esophageal reflux disease without esophagitis: Secondary | ICD-10-CM

## 2020-03-19 DIAGNOSIS — L2089 Other atopic dermatitis: Secondary | ICD-10-CM

## 2020-03-19 DIAGNOSIS — J4541 Moderate persistent asthma with (acute) exacerbation: Secondary | ICD-10-CM | POA: Insufficient documentation

## 2020-03-19 MED ORDER — SPIRIVA RESPIMAT 1.25 MCG/ACT IN AERS: 2.0000 | INHALATION_SPRAY | Freq: Every day | RESPIRATORY_TRACT | 5 refills | Status: DC

## 2020-03-19 NOTE — Patient Instructions (Addendum)
Moderate persistent asthma without complication Begin Spiriva 2 puffs once a day to prevent cough or wheeze Continue montelukast 10 mg once a day to prevent cough or wheeze Continue Symbicort 160-2 puffs twice a day to prevent cough and wheeze.  Begin using a spacer with your inhalers Continue Ventolin 2 puffs every 4 hours as needed for cough or wheeze Continue Dupixent 300 mg subcutaneous injections every other week  Seasonal allergic rhinitis due to pollen Continue azelastine 2 sprays in each nostril twice a day as needed for a runny nose or sneezing Continue Claritin 10 mg once a day as needed for a runny nose. We discourage Claritin D as this can raise your blood pressure Continue Flonase 1-2 sprays in each nostril once a day as needed for a stuffy nose Consider saline nasal rinses once or twice a day (NeilMed is one example). Use this before any medicated nasal sprays Call the clinic if you develop a fever  Atopic dermatitis Continue a daily moisturizing routine Continue triamcinolone 0.1% cream twice a day as needed to red, itchy areas below your face  Gastroesophageal reflux Continue taking pantoprazole 40 mg once a day as previously prescribed Continue dietary and lifestyle modifications   Toxic effect of venom of bees Continue to avoid stinging insects at this time and carry an EpiPen. In case of an allergic reaction, give Benadryl 50 mg every 4 hours, and if life-threatening symptoms occur, inject with EpiPen 0.3 mg.  Call the clinic if this treatment plan is not working well for you  Follow up in 6 months or sooner if needed I

## 2020-03-19 NOTE — Progress Notes (Addendum)
RE: Elaine Byrd MRN: [id number] DOB: 1946/03/12 Date of Telemedicine Visit: 03/19/2020  Referring provider: Karleen Hampshire., MD Primary care provider: Karleen Hampshire., MD  Chief Complaint: Allergic Rhinitis  (runny nose, sneezing, itchy eyes, nasal congestion) and Asthma (occasional wheeze and hard to breathe)   Telemedicine Follow Up Visit via Telephone: I connected with Elaine Byrd for a follow up on 03/19/20 by telephone and verified that I am speaking with the correct person using two identifiers.   I discussed the limitations, risks, security and privacy concerns of performing an evaluation and management service by telephone and the availability of in person appointments. I also discussed with the patient that there may be a patient responsible charge related to this service. The patient expressed understanding and agreed to proceed.  Patient is at home Provider is at the office.  Visit start time: 13 Visit end time: Worth consent/check in by: [facility] consent and medical assistant/nurse: Amy  History of Present Illness: She is a 74 y.o. female, who is being followed for asthma, allergic rhinitis, atopic dermatitis, reflux, and stinging insect allergy.. Her previous allergy office visit was on 11/27/2019 with Dr. Shaune Leeks.  At today's visit, she reports that she got a "cold or allergies" with symptoms including runny nose, and a cough that began on Sunday which is producing clear mucus.  She has been taking NyQuil with some relief of symptoms.  She reports her asthma has been not well controlled with symptoms including shortness of breath and wheeze especially with activity and cough producing clear mucus.  She continues montelukast 10 mg once a day, Symbicort 160-2 puffs twice a day with no spacer, albuterol 3 times a day for the last several days, and using Dupixent once every 2 weeks.  She reports that prior to Saturday she was using albuterol about 3-5 times a week  since the beginning of pollen season and before pollen season using albuterol about once a week.  She refuses to use a spacer with her inhalers.  Allergic rhinitis is reported as not well controlled with increased cough, sneeze, clear rhinorrhea, and nasal congestion.  She reports taking Claritin D with moderate relief of the symptoms.  She is not using nasal sprays.  She refuses to use nasal sprays.  Atopic dermatitis is reported as moderately well controlled with red itchy areas occurring on her left arm for which she uses a daily moisturizer and triamcinolone as needed.  She continues to use hydroxyzine 3 times a day as needed.  Reflux is reported as well controlled with pantoprazole 40 mg once a day.  She continues to avoid stinging insects and has not needed to use her EpiPen since her last visit to this clinic.  Her current medications are listed in the chart.  Assessment and Plan:  Return in about 2 months (around 05/19/2020), or if symptoms worsen or fail to improve.  Meds ordered this encounter  Medications  . Tiotropium Bromide Monohydrate (SPIRIVA RESPIMAT) 1.25 MCG/ACT AERS    Sig: Inhale 2 puffs into the lungs daily.    Dispense:  4 g    Refill:  5    ATTN:  PHARMACIST!!!   Please instruct patient on how to use Spiriva Respimat.  This is a new medication.    Medication List:  Current Outpatient Medications  Medication Sig Dispense Refill  . acetaminophen (TYLENOL) 500 MG tablet Take 500 mg by mouth every 6 (six) hours as needed.    Marland Kitchen albuterol (VENTOLIN  HFA) 108 (90 Base) MCG/ACT inhaler Inhale 2 puffs into the lungs every 4 (four) hours as needed for wheezing or shortness of breath. 18 g 1  . alendronate (FOSAMAX) 70 MG tablet Take 70 mg by mouth once a week.     . B Complex-Biotin-FA (B COMPLETE PO) Take 50 mg by mouth daily.    Marland Kitchen Bioflavonoid Products (ESTER-C) 500-550 MG TABS Take by mouth.    . budesonide-formoterol (SYMBICORT) 160-4.5 MCG/ACT inhaler Inhale 2 puffs into the  lungs 2 (two) times daily.    . clonazePAM (KLONOPIN) 2 MG tablet Take 2 mg by mouth daily. 1/2 tablet in the morning and 1 tablet at night.    . co-enzyme Q-10 30 MG capsule Take 100 mg by mouth daily.    . Cyanocobalamin (B-12) 2500 MCG TABS Take by mouth.    . dupilumab (DUPIXENT) 300 MG/2ML prefilled syringe Inject 300 mg into the skin every 14 (fourteen) days.    Marland Kitchen EPINEPHrine 0.3 mg/0.3 mL IJ SOAJ injection INJECT 0.3ML (0.3MG ) INTO THE MUSCLE ONCE FOR 1 DOSE    . furosemide (LASIX) 20 MG tablet Take 20 mg by mouth every morning.     . hydrOXYzine (ATARAX/VISTARIL) 25 MG tablet Take 1 tablet (25 mg total) by mouth 3 (three) times daily. 90 tablet 3  . Lactobacillus-Inulin (CULTURELLE DIGESTIVE HEALTH PO) Take by mouth.    . levothyroxine (SYNTHROID, LEVOTHROID) 100 MCG tablet Take 100 mcg by mouth daily before breakfast.    . Loratadine-Pseudoephedrine (CLARITIN-D 12 HOUR PO) Take by mouth.    . Methylsulfonylmethane (MSM) 1000 MG CAPS Take by mouth.    . montelukast (SINGULAIR) 10 MG tablet TAKE 1 TABLET EVERY DAY 90 tablet 1  . Multiple Vitamins-Minerals (MULTIVITAMIN ADULT) TABS Take by mouth.    . pantoprazole (PROTONIX) 40 MG tablet Take 40 mg by mouth daily.    Marland Kitchen triamcinolone cream (KENALOG) 0.1 % Apply twice a day to red itchy areas below the face 45 g 3  . Vitamin D-Vitamin K (VITAMIN K2-VITAMIN D3 PO) Take 5,000 mg by mouth daily.    . methocarbamol (ROBAXIN) 500 MG tablet     . Tiotropium Bromide Monohydrate (SPIRIVA RESPIMAT) 1.25 MCG/ACT AERS Inhale 2 puffs into the lungs daily. 4 g 5   No current facility-administered medications for this visit.   Allergies: Allergies  Allergen Reactions  . Morphine And Related Other (See Comments)    hallucinations  . Penicillins Other (See Comments)    Childhood reaction, cannot remember  . Sulfa Antibiotics Other (See Comments)    itchiness   I reviewed her past medical history, social history, family history, and environmental  history and no significant changes have been reported from previous visit on 11/27/2019.  Objective: Physical Exam Not obtained as encounter was done via telephone.   Previous notes and tests were reviewed.  I discussed the assessment and treatment plan with the patient. The patient was provided an opportunity to ask questions and all were answered. The patient agreed with the plan and demonstrated an understanding of the instructions.   The patient was advised to call back or seek an in-person evaluation if the symptoms worsen or if the condition fails to improve as anticipated.  I provided 30 minutes of non-face-to-face time during this encounter.  Thank you for the opportunity to care for this patient.  Please do not hesitate to contact me with questions.  Thermon Leyland, FNP [facility]  I have provided oversight concerning Thermon Leyland' evaluation and treatment of this patient's health issues addressed during today's encounter. I agree with the assessment and therapeutic plan as outlined in the note.   Thank you for the opportunity to care for this patient.  Please do not hesitate to contact me with questions.  Tonette Bihari, M.D.  [facility] [address] [place], [place] [address] [phone]

## 2020-04-26 ENCOUNTER — Encounter: Payer: Self-pay | Admitting: Family Medicine

## 2020-04-29 ENCOUNTER — Other Ambulatory Visit: Payer: Self-pay

## 2020-04-29 ENCOUNTER — Telehealth: Payer: Self-pay

## 2020-04-29 MED ORDER — ADVAIR HFA 115-21 MCG/ACT IN AERO: 2.0000 | INHALATION_SPRAY | Freq: Two times a day (BID) | RESPIRATORY_TRACT | 5 refills | Status: DC

## 2020-04-29 NOTE — Telephone Encounter (Signed)
Elaine Byrd, Elaine Byrd to Ambs, Norvel Richards, FNP     2:43 PM Good morning! I just received a letter from [facility] saying Medicare will no longer accept Symbicort and I need to contact my Doctor for alternatives. I refer this to you. The options are almost endless and I haven't a clue.  I have an inhaler so I'm good. Thank you  [name]     Please advise as advair, flovent are both tier 1

## 2020-04-29 NOTE — Telephone Encounter (Signed)
Please start Advair HFA 115/21  take 2 puffs twice a day with a spacer. Thank you

## 2020-04-29 NOTE — Telephone Encounter (Signed)
Informed pt of the change of inhaler she stated her understanding

## 2020-05-21 ENCOUNTER — Other Ambulatory Visit: Payer: Self-pay

## 2020-05-21 ENCOUNTER — Ambulatory Visit (INDEPENDENT_AMBULATORY_CARE_PROVIDER_SITE_OTHER): Payer: Medicare HMO | Admitting: Family Medicine

## 2020-05-21 ENCOUNTER — Encounter: Payer: Self-pay | Admitting: Family Medicine

## 2020-05-21 VITALS — BP 120/80 | HR 96 | Temp 97.9°F | Resp 20 | Ht 61.81 in | Wt 205.0 lb

## 2020-05-21 DIAGNOSIS — T63441D Toxic effect of venom of bees, accidental (unintentional), subsequent encounter: Secondary | ICD-10-CM

## 2020-05-21 DIAGNOSIS — K219 Gastro-esophageal reflux disease without esophagitis: Secondary | ICD-10-CM | POA: Diagnosis not present

## 2020-05-21 DIAGNOSIS — J4540 Moderate persistent asthma, uncomplicated: Secondary | ICD-10-CM

## 2020-05-21 DIAGNOSIS — L2089 Other atopic dermatitis: Secondary | ICD-10-CM

## 2020-05-21 DIAGNOSIS — J301 Allergic rhinitis due to pollen: Secondary | ICD-10-CM

## 2020-05-21 NOTE — Progress Notes (Signed)
[address] [place] [address] Dept: [phone]  FOLLOW UP NOTE  Patient ID: Elaine Byrd, female    DOB: [birth year]  Age: 74 y.o. MRN: [id number] Date of Office Visit: 05/21/2020  Assessment  Chief Complaint: Cough (shaky feeling after exertion or exercise.), Wheezing, Shortness of Breath, and Nasal Congestion (and runny nose.  sx have not improved since telehealth visit on 03/19/20.)  HPI Elaine Byrd is a 74 year old female who presents to the clinic for follow-up visit.  She was last seen via telephone on 03/19/2020 for evaluation of asthma, allergic rhinitis, atopic dermatitis, reflux, and allergy to stinging insect.  At today's visit, she reports her asthma has been well controlled with symptoms including shortness of breath, chest tightness, increased respiratory effort, some wheezing with activity, and cough which varies between dry and [place] and producing clear to white mucus.  She continues montelukast 10 mg once a day, Advair 230-2 puffs twice a day without a spacer, Spiriva 2 puffs once a day, Dupixent 300 mg once every 2 weeks, and albuterol 3-5 times a day.  She reports that she measures a home oximeter with oxygen saturation between 94-95 with activity and 97-98 at rest after her she takes her medications.  She reports that since November, she has been on prednisone for 4 times for her shoulder.  She is unsure if this prednisone for her shoulder has helped with her breathing.  Allergic rhinitis is reported as not well controlled with clear nasal drainage, nasal congestion in the morning which loosens with activity, and postnasal drainage occurring at least 3 days a week.  She continues Claritin-D once every 12 hours with moderate relief of symptoms.  She refuses any nasal sprays including nasal saline, nasal antihistamine, and nasal steroid sprays.  Atopic dermatitis is reported as moderately well controlled with red itchy areas occurring underneath each axilla for which she  uses baby wipes and is currently not using deodorant.  She continues to use CeraVe moisturizing lotion, occasional triamcinolone and is currently taking hydroxyzine 3 times a day.  Reflux is reported as well controlled with no heartburn while continuing on pantoprazole 40 mg once a day.  She continues to avoid stinging insects with no stings or EpiPen use since her last visit with this clinic.  Her current medications are listed in the chart.   Drug Allergies:  Allergies  Allergen Reactions   Morphine And Related Other (See Comments)    hallucinations   Penicillins Other (See Comments)    Childhood reaction, cannot remember   Sulfa Antibiotics Other (See Comments)    itchiness    Physical Exam: BP 120/80 (BP Location: Right Arm, Patient Position: Sitting, Cuff Size: Large)    Pulse 96    Temp 97.9 F (36.6 C) (Oral)    Resp 20    Ht 5' 1.81" (1.57 m)    Wt 205 lb 0.4 oz (93 kg)    SpO2 97%    BMI 37.73 kg/m    Physical Exam Vitals reviewed.  Constitutional:      Appearance: Normal appearance.  HENT:     Head: Normocephalic and atraumatic.     Right Ear: Tympanic membrane normal.     Left Ear: Tympanic membrane normal.     Nose:     Comments: Bilateral nares slightly erythematous with clear nasal drainage noted.  Septal perforation noted.  Pharynx normal.  Ears normal.  Eyes normal.    Mouth/Throat:     Pharynx: Oropharynx is  clear.  Eyes:     Conjunctiva/sclera: Conjunctivae normal.  Cardiovascular:     Rate and Rhythm: Normal rate and regular rhythm.     Heart sounds: Normal heart sounds. No murmur heard.   Pulmonary:     Effort: Pulmonary effort is normal.     Breath sounds: Normal breath sounds.     Comments: Lungs clear to auscultation Musculoskeletal:        General: Normal range of motion.     Cervical back: Normal range of motion and neck supple.  Skin:    General: Skin is warm and dry.  Neurological:     Mental Status: She is alert and oriented to person,  place, and time.  Psychiatric:        Mood and Affect: Mood normal.        Behavior: Behavior normal.        Thought Content: Thought content normal.        Judgment: Judgment normal.     Diagnostics: FVC 1.43, FEV1 1.13.  Predicted FVC 2.70, predicted FEV1 2.03.  Spirometry indicates moderate restriction.  Postbronchodilator therapy FVC 1.64, FEV1 1.25.  Postbronchodilator spirometry indicates moderate restriction with 15% improvement in FVC and 11% improvement in FEV1.  This is consistent with previous spirometry readings.  Assessment and Plan: 1. Moderate persistent asthma, unspecified whether complicated   2. Seasonal allergic rhinitis due to pollen   3. Flexural atopic dermatitis   4. Gastroesophageal reflux disease without esophagitis   5. Toxic effect of venom of bees, unintentional, subsequent encounter      Patient Instructions  Moderate persistent asthma without complication Begin using a spacer with your inhalersContinue Spiriva 2 puffs once a day to prevent cough or wheeze Continue montelukast 10 mg once a day to prevent cough or wheeze Continue Advair 230-2 puffs twice a day to prevent cough and wheeze.  Continue Ventolin 2 puffs every 4 hours as needed for cough or wheeze Continue Dupixent 300 mg subcutaneous injections every other week If no improvement noted with spacer use, begin prednisone 10 mg tablets. Take 2 tablets twice a day for 3 days, take 2 tablets once a day for 1 day, then take 1 tablet on the 5th day. Call the clinic if you need to start the prednisone taper  Seasonal allergic rhinitis due to pollen Continue Claritin 10 mg once a day as needed for a runny nose. We discourage Claritin D as this can raise your blood pressure Begin Flonase 1-2 sprays in each nostril once a day as needed for a stuffy nose Continue azelastine 2 sprays in each nostril twice a day as needed for a runny nose or sneezing Consider saline nasal rinses once or twice a day (NeilMed  is one example). Use this before any medicated nasal sprays For thick post nasal drainage, begin Mucinex 600 mg- 1200 mg twice a day to thin mucus secretions  Atopic dermatitis Continue a daily moisturizing routine as you have been Continue triamcinolone 0.1% cream twice a day as needed to red, itchy areas below your face  Gastroesophageal reflux Continue taking pantoprazole 40 mg once a day as previously prescribed Continue dietary and lifestyle modifications   Toxic effect of venom of bees Continue to avoid stinging insects at this time and carry an EpiPen. In case of an allergic reaction, give Benadryl 50 mg every 4 hours, and if life-threatening symptoms occur, inject with EpiPen 0.3 mg.  Call the clinic if this treatment plan is not working well for  you  Follow up in 1 month or sooner if needed  Asthma  Return in about 4 weeks (around 06/18/2020), or if symptoms worsen or fail to improve.    Thank you for the opportunity to care for this patient.  Please do not hesitate to contact me with questions.  Thermon Leyland, FNP [facility]

## 2020-05-21 NOTE — Patient Instructions (Addendum)
Moderate persistent asthma without complication Begin using a spacer with your inhalersContinue Spiriva 2 puffs once a day to prevent cough or wheeze Continue montelukast 10 mg once a day to prevent cough or wheeze Continue Advair 230-2 puffs twice a day to prevent cough and wheeze.  Continue Ventolin 2 puffs every 4 hours as needed for cough or wheeze Continue Dupixent 300 mg subcutaneous injections every other week If no improvement noted with spacer use, begin prednisone 10 mg tablets. Take 2 tablets twice a day for 3 days, take 2 tablets once a day for 1 day, then take 1 tablet on the 5th day. Call the clinic if you need to start the prednisone taper  Seasonal allergic rhinitis due to pollen Continue Claritin 10 mg once a day as needed for a runny nose. We discourage Claritin D as this can raise your blood pressure Begin Flonase 1-2 sprays in each nostril once a day as needed for a stuffy nose Continue azelastine 2 sprays in each nostril twice a day as needed for a runny nose or sneezing Consider saline nasal rinses once or twice a day (NeilMed is one example). Use this before any medicated nasal sprays For thick post nasal drainage, begin Mucinex 600 mg- 1200 mg twice a day to thin mucus secretions  Atopic dermatitis Continue a daily moisturizing routine as you have been Continue triamcinolone 0.1% cream twice a day as needed to red, itchy areas below your face  Gastroesophageal reflux Continue taking pantoprazole 40 mg once a day as previously prescribed Continue dietary and lifestyle modifications   Toxic effect of venom of bees Continue to avoid stinging insects at this time and carry an EpiPen. In case of an allergic reaction, give Benadryl 50 mg every 4 hours, and if life-threatening symptoms occur, inject with EpiPen 0.3 mg.  Call the clinic if this treatment plan is not working well for you  Follow up in 1 month or sooner if needed

## 2020-05-27 ENCOUNTER — Encounter: Payer: Self-pay | Admitting: Family Medicine

## 2020-05-27 NOTE — Telephone Encounter (Signed)
Can you please ask Elaine Byrd if the prednisone is helping at all? Any cough (productive?) wheeze, or fever? Chest tightness?

## 2020-05-28 ENCOUNTER — Other Ambulatory Visit: Payer: Self-pay

## 2020-05-28 DIAGNOSIS — J4541 Moderate persistent asthma with (acute) exacerbation: Secondary | ICD-10-CM

## 2020-05-28 NOTE — Progress Notes (Signed)
Thank you Elaine Byrd. Can you please call her and let her know where to go

## 2020-05-28 NOTE — Telephone Encounter (Signed)
Can you please order a covid test and 2 view chest xray for this patient? Please call her with the details about where and when to go for these tests. Thank you

## 2020-05-28 NOTE — Telephone Encounter (Signed)
Spoke to pt over phone. Will schedule covid testing and chest xray appt with [facility].

## 2020-05-30 ENCOUNTER — Ambulatory Visit (HOSPITAL_BASED_OUTPATIENT_CLINIC_OR_DEPARTMENT_OTHER)
Admission: RE | Admit: 2020-05-30 | Discharge: 2020-05-30 | Disposition: A | Discharge status: Home / Self Care | Payer: Medicare HMO | Source: Ambulatory Visit | Attending: Family Medicine | Admitting: Family Medicine

## 2020-05-30 ENCOUNTER — Other Ambulatory Visit: Payer: Self-pay

## 2020-05-30 DIAGNOSIS — J4541 Moderate persistent asthma with (acute) exacerbation: Secondary | ICD-10-CM | POA: Insufficient documentation

## 2020-06-02 ENCOUNTER — Encounter: Payer: Self-pay | Admitting: Family Medicine

## 2020-06-02 ENCOUNTER — Ambulatory Visit: Payer: Medicare HMO | Attending: Internal Medicine

## 2020-06-03 ENCOUNTER — Telehealth: Payer: Self-pay

## 2020-06-03 NOTE — Telephone Encounter (Signed)
Brabson, Toy F  Ambs, Anne M, FNP 21 hours ago (2:28 PM)   I have not been able to do the test. The entire facility is a maze of barriers, ribbons and empty parking lots.. No people, no signs, nothing. I am going home now. I am not coming back to this place. I sent a message to my orthopedic surgeon about the T-12 fracture.  I hope everyone had a relaxing holiday.  Elaine Byrd    Lm for pt to call us back to go over this with her

## 2020-06-03 NOTE — Progress Notes (Signed)
Can you please let this patient know that her chest xray was normal. Please ask her how her breathing is doing? 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 Thank you

## 2020-06-03 NOTE — Telephone Encounter (Signed)
Pt is not wanting to go back to have a covid test as she had such an issue. She believes the heat and humidity is what's causing all these issues with her breathing.

## 2020-06-03 NOTE — Telephone Encounter (Signed)
Can you please ask how she is breathing? Thank you

## 2020-06-03 NOTE — Telephone Encounter (Signed)
Thank you :)

## 2020-06-24 NOTE — Patient Instructions (Addendum)
Moderate persistent asthma without complication Continue Spiriva 2 puffs once a day to prevent cough or wheeze Continue montelukast 10 mg once a day to prevent cough or wheeze Continue Advair 230-2 puffs twice a day with a spacer to prevent cough and wheeze.  Continue Ventolin 2 puffs with a spacer every 4 hours as needed for cough or wheeze Continue Dupixent 300 mg subcutaneous injections every other week If no improvement noted with spacer use, begin prednisone 10 mg tablets.   Seasonal allergic rhinitis due to pollen Continue Claritin D 10 mg once a day as needed for a runny nose. Remember that Claritin D can raise your blood pressure Begin Flonase 1-2 sprays in each nostril once a day as needed for a stuffy nose Continue azelastine 2 sprays in each nostril twice a day as needed for a runny nose or sneezing Consider saline nasal rinses once or twice a day (NeilMed is one example). Use this before any medicated nasal sprays  Atopic dermatitis Continue a daily moisturizing routine as you have been Continue triamcinolone 0.1% cream twice a day as needed to red, itchy areas below your face  Gastroesophageal reflux Continue taking pantoprazole 40 mg once a day as previously prescribed Continue dietary and lifestyle modifications   Toxic effect of venom of bees Continue to avoid stinging insects at this time and carry an EpiPen. In case of an allergic reaction, give Benadryl 50 mg every 4 hours, and if life-threatening symptoms occur, inject with EpiPen 0.3 mg.  Follow up with your primary care provider for the new skin tear on your left arm.   Call the clinic if this treatment plan is not working well for you  Follow up in 3 months or sooner if needed

## 2020-06-24 NOTE — Progress Notes (Addendum)
[address] [place], [place] [address] Dept: [phone]  FOLLOW UP NOTE  Patient ID: Elaine Byrd, female    DOB: 1946/04/14  Age: 74 y.o. MRN: [id number] Date of Office Visit: 06/25/2020  Assessment  Chief Complaint: Asthma  HPI Elaine Byrd is a 74 year old female who presents to the clinic for follow-up visit.  She was last seen in this clinic on 05/21/2020 for evaluation of asthma with acute exacerbation, allergic rhinitis, atopic dermatitis, reflux, and stinging insect allergy.  At that time, she had a chest xray indicating no cardiopulmonary disease. At today's visit, she reports that her asthma has been much more controlled and "back to normal". She reports symptoms including occasional shortness of breath and wheeze, especially if she goes outside and cough producing clear mucus when pollen is high.  She continues montelukast 10 mg once a day, Advair 230-2 puffs twice a day with a spacer, Spiriva 1.25 mcg 2 puffs once a day, and continues albuterol about 2 times a day.  She continues to receive Dupixent 300 mg once every 14 days.  She reports that her asthma has been much more controlled while continuing to receive Dupixent.  Allergic rhinitis is reported as moderately well controlled with nasal congestion occurring in the morning and clear rhinorrhea occurring intermittently for which she takes Claritin-D as needed.  She reports that Claritin-D is the only medication that well relief tinnitus and eustachian tube dysfunction.  She reports that she chooses to use this medication and is aware of the risk for elevated blood pressure.  She has taken Mucinex on a previous occasion and reports that it made her feel like a " fruit loop".  Allergic conjunctivitis is reported as moderately well controlled with red itchy eyes that began last week for which she is using olopatadine with relief of symptoms atopic dermatitis is reported as moderately well controlled with intermittent red itchy areas for  which she continues a daily moisturizing routine using CeraVe or Phytoplex and triamcinolone for stubborn red itchy areas below her face and neck.  Reflux is reported as well controlled with pantoprazole 40 mg once a day.  She continues to avoid insect stings with no accidental encounters since her last visit to this clinic.  She reports that, a few days ago, her purse slipped down her left arm causing a large skin tear and large area of bruising. Her current medications are listed in the chart.   Drug Allergies:  Allergies  Allergen Reactions  . Morphine And Related Other (See Comments)    hallucinations  . Penicillins Other (See Comments)    Childhood reaction, cannot remember  . Sulfa Antibiotics Other (See Comments)    itchiness    Physical Exam: BP 100/70   Pulse 89   Temp 97.7 F (36.5 C) (Oral)   Resp 12   SpO2 96%    Physical Exam Vitals reviewed.  Constitutional:      Appearance: Normal appearance.  HENT:     Head: Normocephalic and atraumatic.     Right Ear: Tympanic membrane normal.     Left Ear: Tympanic membrane normal.     Nose:     Comments: Bilateral nares slightly erythematous with clear nasal drainage.  Pharynx normal.  Ears normal.  Eyes normal.    Mouth/Throat:     Pharynx: Oropharynx is clear.  Eyes:     Conjunctiva/sclera: Conjunctivae normal.  Cardiovascular:     Rate and Rhythm: Normal rate and regular rhythm.  Heart sounds: Normal heart sounds. No murmur heard.   Pulmonary:     Effort: Pulmonary effort is normal.     Breath sounds: Normal breath sounds.     Comments: Lungs clear to auscultation Musculoskeletal:        General: Normal range of motion.     Cervical back: Normal range of motion and neck supple.  Skin:    General: Skin is warm and dry.     Comments: Scattered bruising on bilateral arms and legs.  Skin tear noted on left arm with a large open area she has covered with a spray on dermal protector.  Neurological:     Mental  Status: She is alert and oriented to person, place, and time.  Psychiatric:        Mood and Affect: Mood normal.        Behavior: Behavior normal.        Thought Content: Thought content normal.        Judgment: Judgment normal.     Diagnostics: FVC 1.51, FEV1 1.14.  Predicted FVC 2.70, predicted FEV1 2.03.  Spirometry indicates moderate restriction.  This is consistent with previous spirometry readings.  Assessment and Plan: 1. Moderate persistent asthma without complication   2. Seasonal allergic rhinitis due to pollen   3. Gastroesophageal reflux disease without esophagitis   4. Intrinsic atopic dermatitis   5. Toxic effect of venom of bees, unintentional, subsequent encounter     Meds ordered this encounter  Medications  . EPINEPHrine 0.3 mg/0.3 mL IJ SOAJ injection    Sig: Use as directed for severe allergic reactions    Dispense:  4 each    Refill:  3    Dispense M[employer] generic brand only    Patient Instructions  Moderate persistent asthma without complication Continue Spiriva 2 puffs once a day to prevent cough or wheeze Continue montelukast 10 mg once a day to prevent cough or wheeze Continue Advair 230-2 puffs twice a day with a spacer to prevent cough and wheeze.  Continue Ventolin 2 puffs with a spacer every 4 hours as needed for cough or wheeze Continue Dupixent 300 mg subcutaneous injections every other week If no improvement noted with spacer use, begin prednisone 10 mg tablets.   Seasonal allergic rhinitis due to pollen Continue Claritin D 10 mg once a day as needed for a runny nose. Remember that Claritin D can raise your blood pressure Begin Flonase 1-2 sprays in each nostril once a day as needed for a stuffy nose Continue azelastine 2 sprays in each nostril twice a day as needed for a runny nose or sneezing Consider saline nasal rinses once or twice a day (NeilMed is one example). Use this before any medicated nasal sprays  Atopic dermatitis Continue a  daily moisturizing routine as you have been Continue triamcinolone 0.1% cream twice a day as needed to red, itchy areas below your face  Gastroesophageal reflux Continue taking pantoprazole 40 mg once a day as previously prescribed Continue dietary and lifestyle modifications   Toxic effect of venom of bees Continue to avoid stinging insects at this time and carry an EpiPen. In case of an allergic reaction, give Benadryl 50 mg every 4 hours, and if life-threatening symptoms occur, inject with EpiPen 0.3 mg.  Follow up with your primary care provider for the new skin tear on your left arm.   Call the clinic if this treatment plan is not working well for you  Follow up in 3 months  or sooner if needed    Return in about 3 months (around 09/25/2020), or if symptoms worsen or fail to improve.    Thank you for the opportunity to care for this patient.  Please do not hesitate to contact me with questions.  Thermon Leyland, FNP [facility]  ________________________________________________  I have provided oversight concerning [facility]'s evaluation and treatment of this patient's health issues addressed during today's encounter.  I agree with the assessment and therapeutic plan as outlined in the note.   Signed,   R Jorene Guest, MD

## 2020-06-25 ENCOUNTER — Encounter: Payer: Self-pay | Admitting: Family Medicine

## 2020-06-25 ENCOUNTER — Ambulatory Visit (INDEPENDENT_AMBULATORY_CARE_PROVIDER_SITE_OTHER): Payer: Medicare HMO | Admitting: Family Medicine

## 2020-06-25 ENCOUNTER — Other Ambulatory Visit: Payer: Self-pay

## 2020-06-25 VITALS — BP 100/70 | HR 89 | Temp 97.7°F | Resp 12

## 2020-06-25 DIAGNOSIS — J4540 Moderate persistent asthma, uncomplicated: Secondary | ICD-10-CM | POA: Diagnosis not present

## 2020-06-25 DIAGNOSIS — L2084 Intrinsic (allergic) eczema: Secondary | ICD-10-CM | POA: Diagnosis not present

## 2020-06-25 DIAGNOSIS — J301 Allergic rhinitis due to pollen: Secondary | ICD-10-CM

## 2020-06-25 DIAGNOSIS — K219 Gastro-esophageal reflux disease without esophagitis: Secondary | ICD-10-CM

## 2020-06-25 DIAGNOSIS — T63441D Toxic effect of venom of bees, accidental (unintentional), subsequent encounter: Secondary | ICD-10-CM

## 2020-06-25 MED ORDER — EPINEPHRINE 0.3 MG/0.3ML IJ SOAJ: INTRAMUSCULAR | 3 refills | Status: DC

## 2020-09-21 ENCOUNTER — Other Ambulatory Visit: Payer: Self-pay | Admitting: Pediatrics

## 2020-09-29 ENCOUNTER — Ambulatory Visit (INDEPENDENT_AMBULATORY_CARE_PROVIDER_SITE_OTHER): Payer: Medicare HMO | Admitting: Allergy and Immunology

## 2020-09-29 ENCOUNTER — Encounter: Payer: Self-pay | Admitting: Allergy and Immunology

## 2020-09-29 ENCOUNTER — Other Ambulatory Visit: Payer: Self-pay

## 2020-09-29 VITALS — BP 128/80 | HR 84 | Temp 96.2°F | Resp 32

## 2020-09-29 DIAGNOSIS — J3089 Other allergic rhinitis: Secondary | ICD-10-CM

## 2020-09-29 DIAGNOSIS — L2084 Intrinsic (allergic) eczema: Secondary | ICD-10-CM | POA: Diagnosis not present

## 2020-09-29 DIAGNOSIS — T63441D Toxic effect of venom of bees, accidental (unintentional), subsequent encounter: Secondary | ICD-10-CM

## 2020-09-29 DIAGNOSIS — J4540 Moderate persistent asthma, uncomplicated: Secondary | ICD-10-CM | POA: Diagnosis not present

## 2020-09-29 NOTE — Assessment & Plan Note (Signed)
   Continue appropriate skin care measures.  Continue Dupixent injections as prescribed.  Continue triamcinolone 0.1% to sparingly affected areas twice daily as needed.  Triamcinolone is not to be used on the face, neck, axillae, or groin.

## 2020-09-29 NOTE — Assessment & Plan Note (Signed)
   Continue careful avoidance of flying insects and have access to epinephrine autoinjector 2 pack in case of sting resulting in systemic symptoms.

## 2020-09-29 NOTE — Assessment & Plan Note (Signed)
   Continue appropriate allergen avoidance measures.  To new azelastine nasal spray, 1 to 2 sprays per nostril 1-2 times daily if needed.  Nasal saline lavage (NeilMed) has been recommended as needed and prior to medicated nasal sprays along with instructions for proper administration.

## 2020-09-29 NOTE — Patient Instructions (Addendum)
Moderate persistent asthma Spirometry today reveals a restrictive pattern without postbronchodilator improvement.  Samples have been provided for BrezTri 160 g, 2 inhalations via spacer device twice daily.  For now, hold Advair and Spiriva.  Given the history of major depressive disorder, hold montelukast (Singulair).  Continue Dupixent injections as prescribed.  Continue albuterol HFA, 1 to 2 inhalations every 4-6 hours if needed.  Given the restrictive pattern without postbronchodilator reversibility, I have suggested following up with the pulmonologist for further evaluation/treatment.  Intrinsic atopic dermatitis  Continue appropriate skin care measures.  Continue Dupixent injections as prescribed.  Continue triamcinolone 0.1% to sparingly affected areas twice daily as needed.  Triamcinolone is not to be used on the face, neck, axillae, or groin.  Other allergic rhinitis  Continue appropriate allergen avoidance measures.  To new azelastine nasal spray, 1 to 2 sprays per nostril 1-2 times daily if needed.  Nasal saline lavage (NeilMed) has been recommended as needed and prior to medicated nasal sprays along with instructions for proper administration.  Toxic effect of venom of bees, unintentional  Continue careful avoidance of flying insects and have access to epinephrine autoinjector 2 pack in case of sting resulting in systemic symptoms.   Return in about 3-4 months, or if symptoms worsen or fail to improve.

## 2020-09-29 NOTE — Progress Notes (Signed)
Follow-up Note  RE: Elaine Byrd MRN: [id number] DOB: [birth year] Date of Office Visit: 09/29/2020  Primary care provider: Laqueta Byrd., MD Referring provider: Laqueta Byrd., MD  History of present illness: Elaine Byrd is a 74 y.o. female she reports that on August 27, 2020 she was hospitalized with COVID-19.  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  She saw her primary care physician last week and was given a steroid injection.  Since the steroid injection she has noticed improvement, however still experience some degree of chest heaviness.  In addition, she experiences some shortness of breath with exertion.  She reports that she is in the donut hole and therefore is having to spread out her dosing of inhalers to accommodate, i.e., she is only using Spiriva every other day.  Assessment and plan: Moderate persistent asthma Spirometry today reveals a restrictive pattern without postbronchodilator improvement.  Samples have been provided for BrezTri 160 g, 2 inhalations via spacer device twice daily.  For now, hold Advair and Spiriva.  Given the history of major depressive disorder, hold montelukast (Singulair).  Continue Dupixent injections as prescribed.  Continue albuterol HFA, 1 to 2 inhalations every 4-6 hours if needed.  Given the restrictive pattern without postbronchodilator reversibility, I have suggested following up with the pulmonologist for further evaluation/treatment.  Intrinsic atopic dermatitis  Continue appropriate skin care measures.  Continue Dupixent injections as prescribed.  Continue triamcinolone 0.1% to sparingly affected areas twice daily as needed.  Triamcinolone is not to be used on the face, neck, axillae, or groin.  Other allergic rhinitis  Continue appropriate allergen avoidance measures.  To new azelastine nasal spray, 1 to 2 sprays per  nostril 1-2 times daily if needed.  Nasal saline lavage (NeilMed) has been recommended as needed and prior to medicated nasal sprays along with instructions for proper administration.  Toxic effect of venom of bees, unintentional  Continue careful avoidance of flying insects and have access to epinephrine autoinjector 2 pack in case of sting resulting in systemic symptoms.  Diagnostics: Spirometry reveals an FVC of 1.34 L (50% predicted) and an FEV1 of 1.04 L (52% predicted) without postbronchodilator improvement.  Please see scanned spirometry results for details.    Physical examination: Blood pressure 128/80, pulse 84, temperature (!) 96.2 F (35.7 C), temperature source Temporal, resp. rate (!) 32, SpO2 98 %.  General: Alert, interactive, in no acute distress. HEENT: TMs pearly gray, turbinates mildly edematous without discharge, post-pharynx moderately erythematous. Neck: Supple without lymphadenopathy. Lungs: Mildly decreased breath sounds bilaterally without wheezing, rhonchi or rales. CV: Normal S1, S2 without murmurs. Skin: Warm and dry, without lesions or rashes.  The following portions of the patient's history were reviewed and updated as appropriate: allergies, current medications, past family history, past medical history, past social history, past surgical history and problem list.  Current Outpatient Medications  Medication Sig Dispense Refill  . acetaminophen (TYLENOL) 500 MG tablet Take 500 mg by mouth every 6 (six) hours as needed.    Marland Kitchen albuterol (VENTOLIN HFA) 108 (90 Base) MCG/ACT inhaler Inhale 2 puffs into the lungs every 4 (four) hours as needed for wheezing or shortness of breath. 18 g 1  . alendronate (FOSAMAX) 70 MG tablet Take 70 mg by mouth once a week.     . B Complex-Biotin-FA (B COMPLETE PO) Take 50 mg by mouth daily.    Marland Kitchen Bioflavonoid Products (ESTER-C) 500-550 MG TABS  Take by mouth.    . clonazePAM (KLONOPIN) 2 MG tablet Take 2 mg by mouth daily. 1/2  tablet in the morning and 1 tablet at night.    . co-enzyme Q-10 30 MG capsule Take 100 mg by mouth daily.    . Cyanocobalamin (B-12) 2500 MCG TABS Take by mouth.    . dupilumab (DUPIXENT) 300 MG/2ML prefilled syringe Inject 300 mg into the skin every 14 (fourteen) days.    Marland Kitchen EPINEPHrine 0.3 mg/0.3 mL IJ SOAJ injection Use as directed for severe allergic reactions 4 each 3  . fluticasone-salmeterol (ADVAIR HFA) 115-21 MCG/ACT inhaler Inhale 2 puffs into the lungs 2 (two) times daily. 1 Inhaler 5  . furosemide (LASIX) 20 MG tablet Take 20 mg by mouth every morning.     . hydrOXYzine (ATARAX/VISTARIL) 25 MG tablet Take 1 tablet (25 mg total) by mouth 3 (three) times daily. 90 tablet 3  . Lactobacillus Rhamnosus, GG, (RA PROBIOTIC DIGESTIVE CARE) CAPS Take by mouth.    . levothyroxine (SYNTHROID, LEVOTHROID) 100 MCG tablet Take 100 mcg by mouth daily before breakfast.    . Loratadine-Pseudoephedrine (CLARITIN-D 12 HOUR PO) Take by mouth.    . Methylsulfonylmethane (MSM) 1000 MG CAPS Take by mouth.    . montelukast (SINGULAIR) 10 MG tablet TAKE 1 TABLET EVERY DAY 90 tablet 0  . Multiple Vitamins-Minerals (MULTIVITAMIN ADULT) TABS Take by mouth.    . pantoprazole (PROTONIX) 40 MG tablet Take 40 mg by mouth daily.    . potassium chloride (KLOR-CON) 10 MEQ tablet     . Probiotic Product (CULTURELLE PROBIOTICS PO) Take by mouth 2 (two) times daily.    . Tiotropium Bromide Monohydrate (SPIRIVA RESPIMAT) 1.25 MCG/ACT AERS Inhale 2 puffs into the lungs daily. 4 g 5  . tiZANidine (ZANAFLEX) 4 MG tablet     . triamcinolone cream (KENALOG) 0.1 % Apply twice a day to red itchy areas below the face 45 g 3  . Vitamin D-Vitamin K (VITAMIN K2-VITAMIN D3 PO) Take 5,000 mg by mouth daily.    Marland Kitchen zinc gluconate 50 MG tablet Take by mouth.     No current facility-administered medications for this visit.    Allergies  Allergen Reactions  . Morphine And Related Other (See Comments)    hallucinations  .  Penicillins Other (See Comments)    Childhood reaction, cannot remember  . Sulfa Antibiotics Other (See Comments)    itchiness    I appreciate the opportunity to take part in Mathew's care. Please do not hesitate to contact me with questions.  Sincerely,   R. Jorene Guest, MD

## 2020-09-29 NOTE — Assessment & Plan Note (Addendum)
Spirometry today reveals a restrictive pattern without postbronchodilator improvement.  Samples have been provided for BrezTri 160 g, 2 inhalations via spacer device twice daily.  For now, hold Advair and Spiriva.  Given the history of major depressive disorder, hold montelukast (Singulair).  Continue Dupixent injections as prescribed.  Continue albuterol HFA, 1 to 2 inhalations every 4-6 hours if needed.  Given the restrictive pattern without postbronchodilator reversibility, I have suggested following up with the pulmonologist for further evaluation/treatment.

## 2020-11-03 ENCOUNTER — Telehealth: Payer: Self-pay | Admitting: *Deleted

## 2020-11-03 NOTE — Telephone Encounter (Signed)
Called patient and advised that she needs to fill out [facility] dupixent re-enrollment form in order to receive 2022 free medications

## 2020-12-06 ENCOUNTER — Other Ambulatory Visit: Payer: Self-pay | Admitting: Pediatrics

## 2020-12-22 ENCOUNTER — Encounter: Payer: Self-pay | Admitting: Family Medicine

## 2020-12-23 ENCOUNTER — Other Ambulatory Visit: Payer: Self-pay

## 2020-12-23 MED ORDER — ALBUTEROL SULFATE HFA 108 (90 BASE) MCG/ACT IN AERS: 2.0000 | INHALATION_SPRAY | RESPIRATORY_TRACT | 1 refills | Status: DC | PRN

## 2020-12-25 ENCOUNTER — Other Ambulatory Visit: Payer: Self-pay

## 2020-12-25 MED ORDER — ALBUTEROL SULFATE HFA 108 (90 BASE) MCG/ACT IN AERS: 2.0000 | INHALATION_SPRAY | RESPIRATORY_TRACT | 1 refills | Status: AC | PRN

## 2021-01-19 ENCOUNTER — Encounter: Payer: Self-pay | Admitting: Family Medicine

## 2021-01-19 ENCOUNTER — Ambulatory Visit: Payer: Medicare HMO | Admitting: Family Medicine

## 2021-01-19 ENCOUNTER — Other Ambulatory Visit: Payer: Self-pay

## 2021-01-19 VITALS — BP 114/60 | HR 82 | Temp 97.9°F | Resp 20

## 2021-01-19 DIAGNOSIS — J302 Other seasonal allergic rhinitis: Secondary | ICD-10-CM

## 2021-01-19 DIAGNOSIS — K219 Gastro-esophageal reflux disease without esophagitis: Secondary | ICD-10-CM | POA: Diagnosis not present

## 2021-01-19 DIAGNOSIS — J4540 Moderate persistent asthma, uncomplicated: Secondary | ICD-10-CM | POA: Diagnosis not present

## 2021-01-19 DIAGNOSIS — J3089 Other allergic rhinitis: Secondary | ICD-10-CM | POA: Diagnosis not present

## 2021-01-19 DIAGNOSIS — L2084 Intrinsic (allergic) eczema: Secondary | ICD-10-CM

## 2021-01-19 DIAGNOSIS — T63441D Toxic effect of venom of bees, accidental (unintentional), subsequent encounter: Secondary | ICD-10-CM

## 2021-01-19 NOTE — Progress Notes (Signed)
[address] [place] [address] Dept: [phone]  FOLLOW UP NOTE  Patient ID: Elaine Byrd, female    DOB: 1946-09-21  Age: 75 y.o. MRN: [id number] Date of Office Visit: 01/19/2021  Assessment  Chief Complaint: Allergic Rhinitis  and Asthma  HPI Elaine Byrd is a 75 year old female who presents to the clinic for follow-up visit.  She was last seen in this clinic on 09/29/2020 by Dr. Nunzio Cobbs for evaluation of asthma, allergic rhinitis, atopic dermatitis, reflux, and allergy to stinging insect.  In the interim, she has seen Lysle Pearl, PA-C with [facility] pulmonology for an initial visit on 11/19/2020.  On 12/10/2020 she had a CT scan that indicated mild air trapping and full pulmonary function testing. At today's visit, she reports her asthma has been moderately well controlled with symptoms including shortness of breath, cough, and wheeze with any activity and occasionally with rest.  She reports these have steadily improved while continuing on Breztri 2 puffs twice a day with a spacer, Dupixent injections, and albuterol as needed.  She continues to use albuterol upon waking every morning and before going to bed on a regular schedule.  Her ACT score is 8.  She reports that she continues to struggle with long-term symptoms after having Covid pneumonia with a hospital admission in September.  The symptoms include muscle aches, fatigue, respiratory symptoms, and decrease in activity making activities of daily life draining and difficult.  Allergic rhinitis is reported as moderately well controlled with nasal congestion and clear rhinorrhea occurring every morning for about 30 minutes.  She also reports bouts of sneezing in the morning.  She continues azelastine nasal spray and nasal rinses.  Atopic dermatitis is reported as moderately well controlled well continuing on Dupixent.  She continues a daily moisturizing routine and triamcinolone as needed.  Reflux is reported as  moderately well controlled with pantoprazole daily.  She continues to avoid stinging insects with no interaction with stinging insects or EpiPen use since her last visit to this clinic.  Her current medications are listed in the chart.   Drug Allergies:  Allergies  Allergen Reactions  . Morphine And Related Other (See Comments)    hallucinations  . Penicillins Other (See Comments)    Childhood reaction, cannot remember  . Sulfa Antibiotics Other (See Comments)    itchiness    Physical Exam: BP 114/60 (BP Location: Left Arm, Patient Position: Sitting, Cuff Size: Large)   Pulse 82   Temp 97.9 F (36.6 C) (Tympanic)   Resp 20   SpO2 97%    Physical Exam Vitals reviewed.  Constitutional:      Appearance: Normal appearance.  HENT:     Head: Normocephalic and atraumatic.     Right Ear: Tympanic membrane normal.     Left Ear: Tympanic membrane normal.     Nose:     Comments: Bilateral nares slightly erythematous with clear nasal drainage noted.  Pharynx normal.  Oral cavity extremely dry.  Ears normal.  Eyes normal.    Mouth/Throat:     Mouth: Mucous membranes are dry.     Pharynx: Oropharynx is clear.  Eyes:     Conjunctiva/sclera: Conjunctivae normal.  Cardiovascular:     Rate and Rhythm: Normal rate and regular rhythm.     Heart sounds: Normal heart sounds. No murmur heard.   Pulmonary:     Effort: Pulmonary effort is normal.     Breath sounds: Normal breath sounds.  Comments: Lungs clear to auscultation Musculoskeletal:        General: Normal range of motion.     Cervical back: Normal range of motion and neck supple.     Comments: Slight bilateral pitting edema noted  Skin:    General: Skin is warm.     Comments: Scattered bruising noted.  Scattered red eczematous areas noted.  No open areas, no drainage noted  Neurological:     Mental Status: She is alert and oriented to person, place, and time.  Psychiatric:        Mood and Affect: Mood normal.         Behavior: Behavior normal.        Thought Content: Thought content normal.        Judgment: Judgment normal.     Diagnostics: FVC 1.45 FEV1 1.14.  Predicted FVC 2.66, predicted FEV1 2.00.  Spirometry indicates moderate restriction.  This is consistent with previous spirometry readings.    Assessment and Plan: 1. Moderate persistent asthma, unspecified whether complicated   2. Seasonal and perennial allergic rhinitis   3. Intrinsic atopic dermatitis   4. Gastroesophageal reflux disease without esophagitis   5. Toxic effect of venom of bees, unintentional, subsequent encounter     Patient Instructions  Moderate persistent asthma without complication/ACO Continue Brezrti 2 puffs twice a day with a spacer to prevent cough or wheeze Continue Ventolin 2 puffs with a spacer every 4 hours as needed for cough or wheeze You may use albuterol 2 puffs 5-15 minutes before activity to decrease cough and wheeze Continue Dupixent 300 mg subcutaneous injections every other week Continue follow up with your pulmonary specialist  Seasonal allergic rhinitis due to pollen Continue Claritin D 10 mg once a day as needed for a runny nose. Remember that Claritin D can raise your blood pressure Begin Flonase 1-2 sprays in each nostril once a day as needed for a stuffy nose.  In the right nostril, point the applicator out toward the right ear. In the left nostril, point the applicator out toward the left ear Continue azelastine 2 sprays in each nostril twice a day as needed for a runny nose or sneezing Consider saline nasal rinses once or twice a day (NeilMed is one example). Use this before any medicated nasal sprays Continue allergen avoidance measures directed toward grass pollen, weed pollen, tree pollen, dust mite, cat, and molds as listed below  Atopic dermatitis Continue a daily moisturizing routine as you have been Continue triamcinolone 0.1% cream twice a day as needed to red, itchy areas below your  face  Gastroesophageal reflux Continue taking pantoprazole 40 mg once a day as previously prescribed Continue dietary and lifestyle modifications   Stinging insect allergy Continue to avoid stinging insects.  In case of an allergic reaction, take Benadryl 50 mg every 4 hours, and if life-threatening symptoms occur, inject with EpiPen 0.3 mg.  Follow up with your primary care provider for the new skin tear on your left arm.   Call the clinic if this treatment plan is not working well for you  Follow up in 4 months or sooner if needed    Return in about 4 months (around 05/19/2021), or if symptoms worsen or fail to improve.    Thank you for the opportunity to care for this patient.  Please do not hesitate to contact me with questions.  Thermon Leyland, FNP [facility]

## 2021-01-19 NOTE — Patient Instructions (Addendum)
Moderate persistent asthma without complication/ACO Continue Brezrti 2 puffs twice a day with a spacer to prevent cough or wheeze Continue Ventolin 2 puffs with a spacer every 4 hours as needed for cough or wheeze You may use albuterol 2 puffs 5-15 minutes before activity to decrease cough and wheeze Continue Dupixent 300 mg subcutaneous injections every other week Continue follow up with your pulmonary specialist  Seasonal allergic rhinitis due to pollen Continue Claritin D 10 mg once a day as needed for a runny nose. Remember that Claritin D can raise your blood pressure Begin Flonase 1-2 sprays in each nostril once a day as needed for a stuffy nose.  In the right nostril, point the applicator out toward the right ear. In the left nostril, point the applicator out toward the left ear Continue azelastine 2 sprays in each nostril twice a day as needed for a runny nose or sneezing Consider saline nasal rinses once or twice a day (NeilMed is one example). 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  In case of an allergic reaction, take Benadryl 50 mg every 4 hours, and if life-threatening symptoms occur, inject with EpiPen 0.3 mg.  Follow up with your primary care provider for the new skin tear on your left arm.   Call the clinic if this treatment plan is not working well for you  Follow up in 4 months or sooner if needed    Lifestyle Changes for Controlling GERD When you have GERD, stomach  acid feels as if it's backing up toward your mouth. Whether or not you take medication to control your GERD, your symptoms can often be improved with lifestyle changes.   Raise Your Head  Reflux is more likely to strike when you're lying down flat, because stomach fluid can  flow backward more easily. Raising the head of your bed 4-6 inches can help. To do this:  Slide blocks or books under the legs at the head of your bed. Or, place a wedge under  the mattress. Many foam stores can make a suitable wedge for you. The wedge  should run from your waist to the top of your head.  Don't just prop your head on several pillows. This increases pressure on your  stomach. It can make GERD worse.  Watch Your Eating Habits Certain foods may increase the acid in your stomach or relax the lower esophageal sphincter, making GERD more likely. It's best to avoid the following:  Coffee, tea, and carbonated drinks (with and without caffeine)  Fatty, fried, or spicy food  Mint, chocolate, onions, and tomatoes  Any other foods that seem to irritate your stomach or cause you pain  Relieve the Pressure  Eat smaller meals, even if you have to eat more often.  Don't lie down right after you eat. Wait a few hours for your stomach to empty.  Avoid tight belts and tight-fitting clothes.  Lose excess weight.  Tobacco and Alcohol  Avoid smoking tobacco and drinking alcohol. They can make GERD symptoms worse.  Reducing Pollen Exposure The  American Academy of All[employer] Asthma [employer] suggests the following steps to reduce your exposure to pollen during allergy seasons. 1. Do not hang sheets or clothing out to dry; pollen may collect on these items. 2. Do not mow lawns or spend time around freshly cut grass; mowing stirs up pollen. 3. Keep windows closed at night.  Keep car windows closed while driving. 4. Minimize morning activities outdoors, a time when pollen counts are usually at their  highest. 5. Stay indoors as much as possible when pollen counts or humidity is high and on windy days when pollen tends to remain in the air longer. 6. Use air conditioning when possible.  Many air conditioners have filters that trap the pollen spores. 7. Use a HEPA room air filter to remove pollen form the indoor air you breathe.  Control of Dog or Cat Allergen Avoidance is the best way to manage a dog or cat allergy. If you have a dog or cat and are allergic to dog or cats, consider removing the dog or cat from the home. If you have a dog or cat but don't want to find it a new home, or if your family wants a pet even though someone in the household is allergic, here are some strategies that may help keep symptoms at bay:  8. Keep the pet out of your bedroom and restrict it to only a few rooms. Be advised that keeping the dog or cat in only one room will not limit the allergens to that room. 9. Don't pet, hug or kiss the dog or cat; if you do, wash your hands with soap and water. 10. High-efficiency particulate air (HEPA) cleaners run continuously in a bedroom or living room can reduce allergen levels over time. 11. Regular use of a high-efficiency vacuum cleaner or a central vacuum can reduce allergen levels. 12. Giving your dog or cat a bath at least once a week can reduce airborne allergen.  Control of Mold Allergen Mold and fungi can grow on a variety of surfaces provided certain temperature and moisture conditions exist.  Outdoor molds grow on plants, decaying vegetation and soil.  The major outdoor mold, Alternaria and Cladosporium, are found in very high numbers during hot and dry conditions.  Generally, a late Summer - Fall peak is seen for common outdoor fungal spores.  Rain will temporarily lower outdoor mold spore count, but counts rise rapidly when the rainy period ends.  The most important indoor molds are Aspergillus and Penicillium.  Dark, humid and poorly ventilated basements are  ideal sites for mold growth.  The next most common sites of mold growth are the bathroom and the kitchen.  Outdoor [employer] 13. Use air conditioning and keep windows closed 14. Avoid exposure to decaying vegetation. 15. Avoid leaf raking. 16. Avoid grain handling. 17. Consider wearing a face mask if working in moldy areas.  Indoor Mold Control 1. Maintain humidity below 50%. 2. Clean washable surfaces with 5% bleach solution. 3. Remove sources e.g. Contaminated carpets.   Control of Dust Mite Allergen Dust mites play a major role in allergic asthma and rhinitis. They occur in environments with high humidity wherever human skin is found. Dust mites absorb humidity from the atmosphere (ie, they do not drink) and feed on organic matter (including shed human and animal skin). Dust mites are a microscopic type of insect that you cannot see with the naked eye. High levels of dust mites have been detected from mattresses, pillows, carpets, upholstered  furniture, bed covers, clothes, soft toys and any woven material. The principal allergen of the dust mite is found in its feces. A gram of dust may contain 1,000 mites and 250,000 fecal particles. Mite antigen is easily measured in the air during house cleaning activities. Dust mites do not bite and do not cause harm to humans, other than by triggering allergies/asthma.  Ways to decrease your exposure to dust mites in your home:  1. Encase mattresses, box springs and pillows with a mite-impermeable barrier or cover  2. Wash sheets, blankets and drapes weekly in hot water (130 F) with detergent and dry them in a dryer on the hot setting.  3. Have the room cleaned frequently with a vacuum cleaner and a damp dust-mop. For carpeting or rugs, vacuuming with a vacuum cleaner equipped with a high-efficiency particulate air (HEPA) filter. The dust mite allergic individual should not be in a room which is being cleaned and should wait 1 hour after cleaning  before going into the room.  4. Do not sleep on upholstered furniture (eg, couches).  5. If possible removing carpeting, upholstered furniture and drapery from the home is ideal. Horizontal blinds should be eliminated in the rooms where the person spends the most time (bedroom, study, television room). Washable vinyl, roller-type shades are optimal.  6. Remove all non-washable stuffed toys from the bedroom. Wash stuffed toys weekly like sheets and blankets above.  7. Reduce indoor humidity to less than 50%. Inexpensive humidity monitors can be purchased at most hardware stores. Do not use a humidifier as can make the problem worse and are not recommended.

## 2021-02-20 ENCOUNTER — Other Ambulatory Visit: Payer: Self-pay | Admitting: Pediatrics

## 2021-03-29 IMAGING — DX DG CHEST 2V
2 series · 2 of 2 positions shown · non-contrast
Comparison: 10/29/2019

CLINICAL DATA: Wheezing. Increasing shortness of breath since
[REDACTED].

EXAM:
CHEST - 2 VIEW

[chest pa]
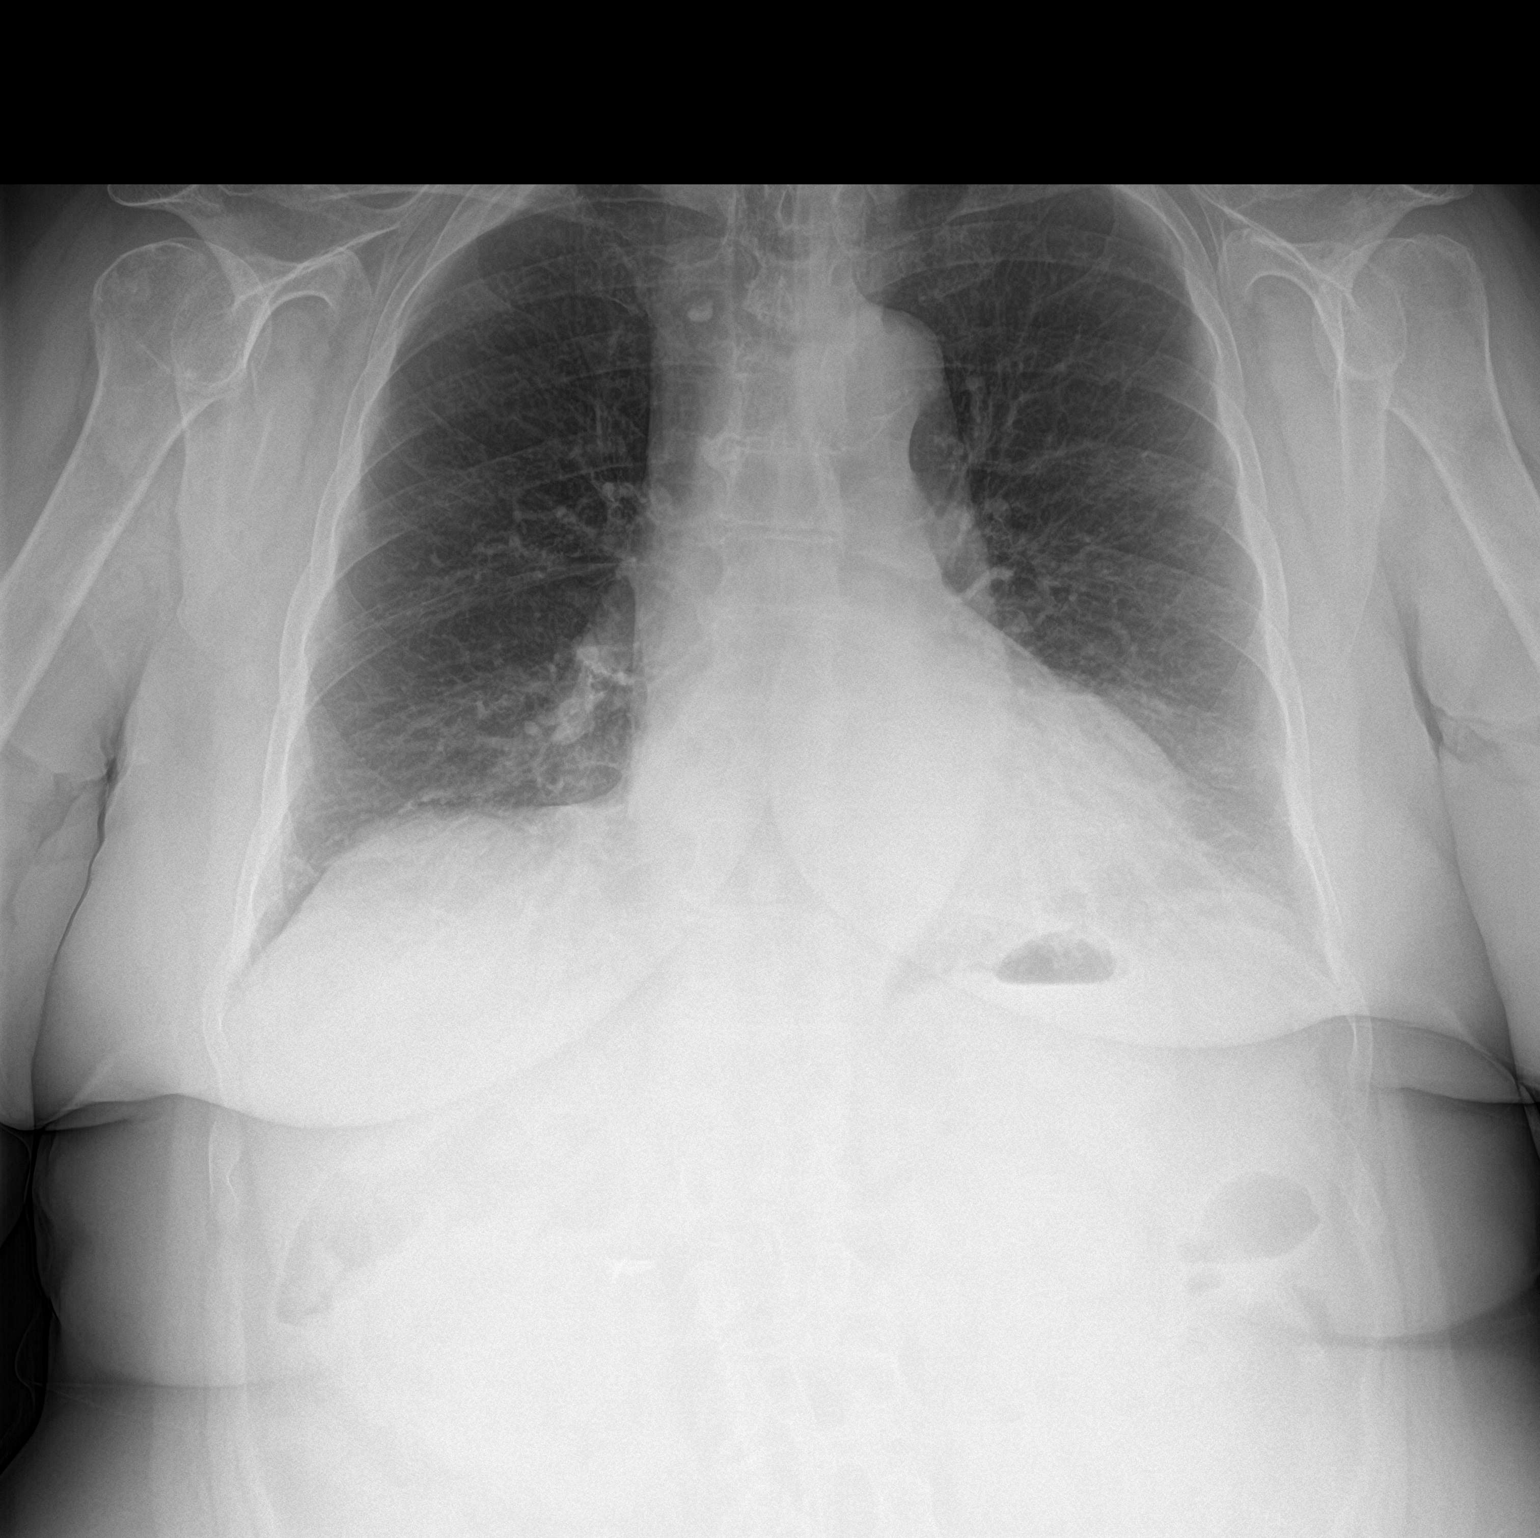

[chest lat]
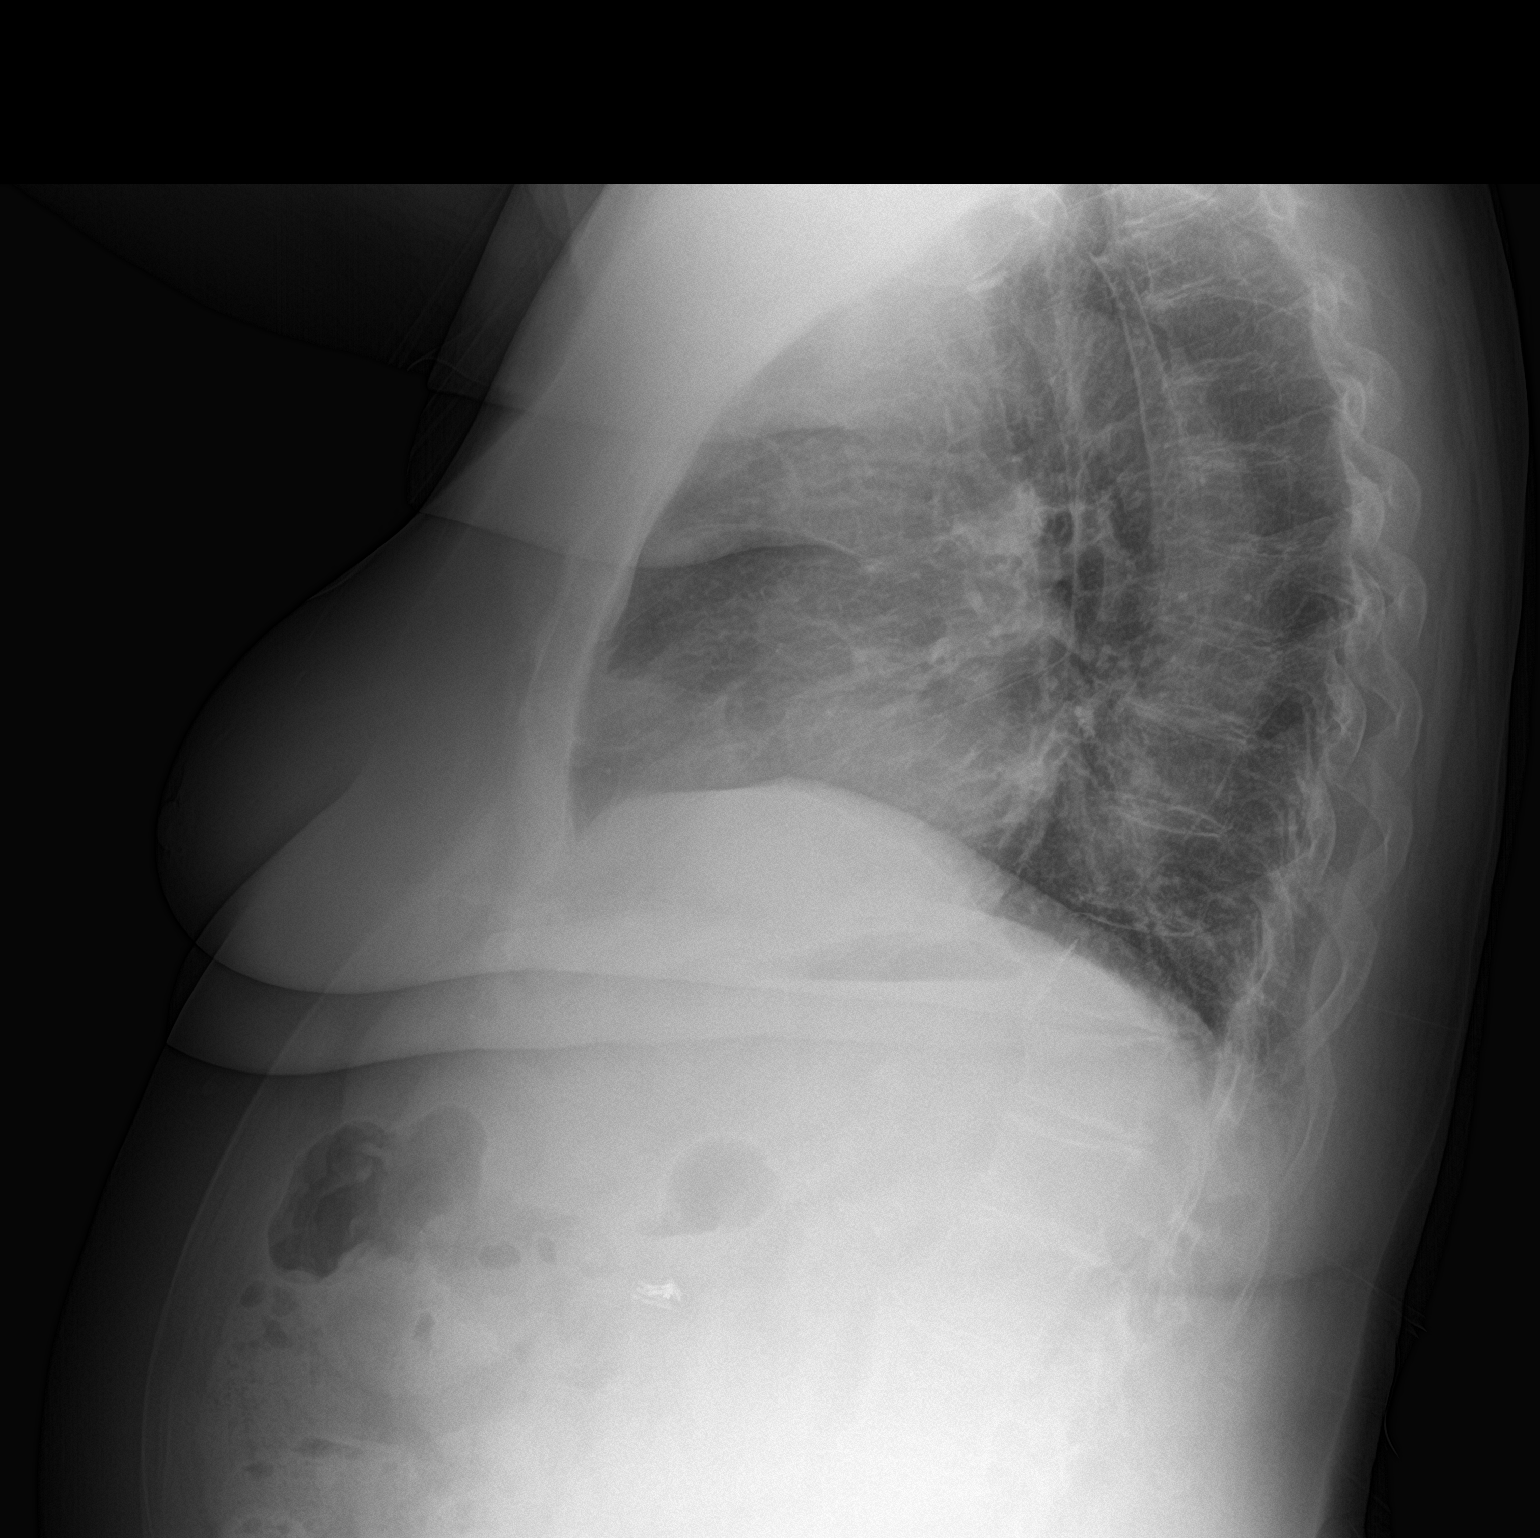

[2 of 2 positions shown; findings below may reference images not displayed]

FINDINGS: Heart size is normal. No focal consolidations or pleural effusions.
No pleural effusions. There is a chronic wedge compression fracture
of T12.
IMPRESSION: No active cardiopulmonary disease.

## 2021-05-19 ENCOUNTER — Encounter: Payer: Self-pay | Admitting: Family Medicine

## 2021-05-19 ENCOUNTER — Other Ambulatory Visit: Payer: Self-pay

## 2021-05-19 ENCOUNTER — Ambulatory Visit (INDEPENDENT_AMBULATORY_CARE_PROVIDER_SITE_OTHER): Payer: Medicare HMO | Admitting: Family Medicine

## 2021-05-19 VITALS — BP 116/80 | HR 80 | Temp 97.3°F | Resp 20 | Ht 62.0 in | Wt 194.0 lb

## 2021-05-19 DIAGNOSIS — T63441D Toxic effect of venom of bees, accidental (unintentional), subsequent encounter: Secondary | ICD-10-CM

## 2021-05-19 DIAGNOSIS — L2089 Other atopic dermatitis: Secondary | ICD-10-CM

## 2021-05-19 DIAGNOSIS — K219 Gastro-esophageal reflux disease without esophagitis: Secondary | ICD-10-CM | POA: Diagnosis not present

## 2021-05-19 DIAGNOSIS — J4540 Moderate persistent asthma, uncomplicated: Secondary | ICD-10-CM

## 2021-05-19 DIAGNOSIS — J3089 Other allergic rhinitis: Secondary | ICD-10-CM

## 2021-05-19 DIAGNOSIS — J302 Other seasonal allergic rhinitis: Secondary | ICD-10-CM

## 2021-05-19 MED ORDER — EPINEPHRINE 0.3 MG/0.3ML IJ SOAJ: INTRAMUSCULAR | 3 refills | Status: DC

## 2021-05-19 NOTE — Progress Notes (Signed)
[address] [place] [address] Dept: [phone]  FOLLOW UP NOTE  Patient ID: Elaine Byrd, female    DOB: [birth year]  Age: 75 y.o. MRN: [id number] Date of Office Visit: 05/19/2021  Assessment  Chief Complaint: Follow-up (Asthma symptoms are well managed, allergy  symptoms have slightly improved, but managed with Meds.)  HPI Elaine Byrd is a 75 year old female who presents to the clinic for follow-up visit.  She was last seen in this clinic on 01/19/2021 for evaluation of asthma, allergic rhinitis, atopic dermatitis, reflux, and allergy to insect.  At today's visit, she reports her asthma has been moderately well controlled with shortness of breath with activity and rest, occasional wheeze, and cough which is reported as dry as well as producing mucus.  She continues Breztri 2 puffs twice a day with a spacer and is currently using albuterol 2 puffs once a day on a regular schedule.  She continues dupilumab 300 mg once every 2 weeks with no local or large reactions.  She reports a significant decrease in her symptoms of asthma while continuing on dupilumab injections.  She self administers these at home with no issues.  Allergic rhinitis is reported as moderately well controlled with symptoms including nasal congestion occurring in the morning, frequent sneezing, and occasional postnasal drainage.  She continues Claritin-D once every 12 hours.  She is not currently using Flonase, azelastine, or saline nasal rinses.  She reports that she does not like nasal sprays and is not willing to use these.  Atopic dermatitis is reported as moderately well controlled with red itchy areas scattered over her body for which she continues a daily moisturizing routine, blue emu cream, triamcinolone as needed, and hydroxyzine 3 times a day.  Reflux is reported as moderately well controlled with pantoprazole daily and Gaviscon as needed.  She continues to follow with [facility] gastrointestinal specialists.  She  does report having C. difficile and taking vancomycin for 3 months with a follow-up colonoscopy earlier this month that was clear of C. difficile.  She does report that her appetite remains decreased, however, this is improving.  She denies any interaction with stinging insects and has not used her EpiPen since her last visit to this clinic.  Her current medications are listed in the chart.   Drug Allergies:  Allergies  Allergen Reactions   Morphine And Related Other (See Comments)    hallucinations   Penicillins Other (See Comments)    Childhood reaction, cannot remember   Sulfa Antibiotics Other (See Comments) and Diarrhea    itchiness    Physical Exam: BP 116/80 (BP Location: Right Arm, Patient Position: Sitting, Cuff Size: Large)   Pulse 80   Temp (!) 97.3 F (36.3 C) (Temporal)   Resp 20   Ht 5\' 2"  (1.575 m)   Wt 194 lb (88 kg)   SpO2 99%   BMI 35.48 kg/m    Physical Exam Vitals reviewed.  Constitutional:      Appearance: Normal appearance.  HENT:     Head: Normocephalic and atraumatic.     Right Ear: Tympanic membrane normal.     Left Ear: Tympanic membrane normal.     Nose:     Comments: Bilateral nares slightly erythematous with clear nasal drainage noted.  Pharynx normal.  Ears normal.  Eyes normal.    Mouth/Throat:     Pharynx: Oropharynx is clear.  Eyes:     Conjunctiva/sclera: Conjunctivae normal.  Cardiovascular:     Rate and Rhythm:  Normal rate and regular rhythm.     Heart sounds: Normal heart sounds. No murmur heard. Pulmonary:     Effort: Pulmonary effort is normal.     Breath sounds: Normal breath sounds.     Comments: Lungs clear to auscultation Musculoskeletal:        General: Normal range of motion.     Cervical back: Normal range of motion and neck supple.  Skin:    General: Skin is warm.     Comments: Eczematous patches noted on her arms and legs.  Thin skin with multiple bruises noted on arms.  No open areas or drainage noted  Neurological:      Mental Status: She is alert and oriented to person, place, and time.  Psychiatric:        Mood and Affect: Mood normal.        Behavior: Behavior normal.        Thought Content: Thought content normal.        Judgment: Judgment normal.    Diagnostics: FVC 1.62, FEV1 0.93.  Predicted FVC 2.57, predicted FEV1 1.99.  Spirometry indicates moderate restriction and moderate airway obstruction.  Postbronchodilator FVC 1.51, FEV1 1.19.  Postbronchodilator spirometry indicates moderate restriction with 28% improvement in FEV1.  Assessment and Plan: 1. Moderate persistent asthma, unspecified whether complicated   2. Seasonal and perennial allergic rhinitis   3. Flexural atopic dermatitis   4. Gastroesophageal reflux disease without esophagitis   5. Toxic effect of venom of bees, unintentional, subsequent encounter     Meds ordered this encounter  Medications   EPINEPHrine 0.3 mg/0.3 mL IJ SOAJ injection    Sig: Use as directed for severe allergic reactions    Dispense:  4 each    Refill:  3    Dispense [employer] generic brand only     Patient Instructions  Moderate persistent asthma without complication/ACO Begin Alvesco 80-2 puffs once a day with a spacer for the next 2 weeks, then stop Continue Brezrti 2 puffs twice a day with a spacer to prevent cough or wheeze Continue Ventolin 2 puffs with a spacer every 4 hours as needed for cough or wheeze You may use albuterol 2 puffs 5-15 minutes before activity to decrease cough and wheeze Continue Dupixent 300 mg subcutaneous injections every other week If no improvement, refer to pulmonology for further evaluation  Seasonal allergic rhinitis due to pollen Continue Claritin D 10 mg once a day as needed for a runny nose. Remember that Claritin D can raise your blood pressure Begin Flonase 1-2 sprays in each nostril once a day as needed for a stuffy nose.  In the right nostril, point the applicator out toward the right ear. In the left  nostril, point the applicator out toward the left ear Continue azelastine 2 sprays in each nostril twice a day as needed for a runny nose or sneezing Consider saline nasal rinses once or twice a day (NeilMed is one example). 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  In case of an allergic reaction, take Benadryl  50 mg every 4 hours, and if life-threatening symptoms occur, inject with EpiPen 0.3 mg.  Follow up with your primary care provider for the new skin tear on your left arm.   Call the clinic if this treatment plan is not working well for you  Follow up in 2 months or sooner if needed    Return in about 2 months (around 07/19/2021), or if symptoms worsen or fail to improve.    Thank you for the opportunity to care for this patient.  Please do not hesitate to contact me with questions.  Thermon Leyland, FNP [facility]

## 2021-05-19 NOTE — Patient Instructions (Addendum)
Moderate persistent asthma without complication/ACO Begin Alvesco 80-2 puffs once a day with a spacer for the next 2 weeks, then stop Continue Brezrti 2 puffs twice a day with a spacer to prevent cough or wheeze Continue Ventolin 2 puffs with a spacer every 4 hours as needed for cough or wheeze You may use albuterol 2 puffs 5-15 minutes before activity to decrease cough and wheeze Continue Dupixent 300 mg subcutaneous injections every other week If no improvement, refer to pulmonology for further evaluation  Seasonal allergic rhinitis due to pollen Continue Claritin D 10 mg once a day as needed for a runny nose. Remember that Claritin D can raise your blood pressure Begin Flonase 1-2 sprays in each nostril once a day as needed for a stuffy nose.  In the right nostril, point the applicator out toward the right ear. In the left nostril, point the applicator out toward the left ear Continue azelastine 2 sprays in each nostril twice a day as needed for a runny nose or sneezing Consider saline nasal rinses once or twice a day (NeilMed is one example). 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  In case of an allergic reaction, take Benadryl 50 mg every 4 hours, and if life-threatening symptoms occur, inject with EpiPen 0.3 mg.  Follow up with your primary care provider for the new skin tear on your left arm.   Call the clinic if this treatment plan is not working well for you  Follow up in 2  months or sooner if needed    Lifestyle Changes for Controlling GERD When you have GERD, stomach acid feels as if it's backing up toward your mouth. Whether or not you take medication to control your GERD, your symptoms can often be improved with lifestyle changes.   Raise Your Head Reflux is more likely to strike when you're lying down flat, because stomach fluid can flow backward more easily. Raising the head of your bed 4-6 inches can help. To do this: Slide blocks or books under the legs at the head of your bed. Or, place a wedge under the mattress. Many foam stores can make a suitable wedge for you. The wedge should run from your waist to the top of your head. Don't just prop your head on several pillows. This increases pressure on your stomach. It can make GERD worse.  Watch Your Eating Habits Certain foods may increase the acid in your stomach or relax the lower esophageal sphincter, making GERD more likely. It's best to avoid the following: Coffee, tea, and carbonated drinks (with and without caffeine) Fatty, fried, or spicy food Mint, chocolate, onions, and tomatoes Any other foods that seem to irritate your stomach or cause you pain  Relieve the Pressure Eat smaller meals, even if you have to eat more often. Don't lie down right after you eat. Wait a few hours for your stomach to empty. Avoid tight belts and tight-fitting clothes. Lose excess weight.  Tobacco and Alcohol Avoid smoking tobacco and drinking alcohol. They can make GERD symptoms worse.  Reducing  Pollen Exposure The American Academy of Allergy, Asthma and Immunology suggests the following steps to reduce your exposure to pollen during allergy seasons. Do not hang sheets or clothing out to dry; pollen may collect on these items. Do not mow lawns or spend time around freshly cut grass; mowing stirs up pollen. Keep windows closed at night.  Keep car windows closed while driving. Minimize morning activities  outdoors, a time when pollen counts are usually at their highest. Stay indoors as much as possible when pollen counts or humidity is high and on windy days when pollen tends to remain in the air longer. Use air conditioning when possible.  Many air conditioners have filters that trap the pollen spores. Use a HEPA room air filter to remove pollen form the indoor air you breathe.  Control of Dog or Cat Allergen Avoidance is the best way to manage a dog or cat allergy. If you have a dog or cat and are allergic to dog or cats, consider removing the dog or cat from the home. If you have a dog or cat but don't want to find it a new home, or if your family wants a pet even though someone in the household is allergic, here are some strategies that may help keep symptoms at bay:  Keep the pet out of your bedroom and restrict it to only a few rooms. Be advised that keeping the dog or cat in only one room will not limit the allergens to that room. Don't pet, hug or kiss the dog or cat; if you do, wash your hands with soap and water. High-efficiency particulate air (HEPA) cleaners run continuously in a bedroom or living room can reduce allergen levels over time. Regular use of a high-efficiency vacuum cleaner or a central vacuum can reduce allergen levels. Giving your dog or cat a bath at least once a week can reduce airborne allergen.  Control of Mold Allergen Mold and fungi can grow on a variety of surfaces provided certain temperature and moisture conditions exist.  Outdoor molds grow on plants, decaying vegetation and soil.  The major outdoor mold, Alternaria and Cladosporium, are found in very high numbers during hot and dry conditions.  Generally, a late Summer - Fall peak is seen for common outdoor fungal spores.  Rain will temporarily lower outdoor mold spore count, but counts rise rapidly when the rainy period ends.  The most important indoor molds are Aspergillus and Penicillium.  Dark, humid and  poorly ventilated basements are ideal sites for mold growth.  The next most common sites of mold growth are the bathroom and the kitchen.  Outdoor [employer] Use air conditioning and keep windows closed Avoid exposure to decaying vegetation. Avoid leaf raking. Avoid grain handling. Consider wearing a face mask if working in moldy areas.  Indoor Mold Control Maintain humidity below 50%. Clean washable surfaces with 5% bleach solution. Remove sources e.g. Contaminated carpets.   Control of Dust Mite Allergen Dust mites play a major role in allergic asthma and rhinitis. They occur in environments with high humidity wherever human skin is found. Dust mites absorb humidity from the atmosphere (ie, they do not drink) and feed on organic matter (including shed human and animal skin). Dust mites are a microscopic type of insect that you cannot see with the naked eye. High levels of dust mites have been detected from mattresses, pillows, carpets, upholstered furniture, bed covers, clothes, soft toys and any woven material. The principal allergen of the dust mite  is found in its feces. A gram of dust may contain 1,000 mites and 250,000 fecal particles. Mite antigen is easily measured in the air during house cleaning activities. Dust mites do not bite and do not cause harm to humans, other than by triggering allergies/asthma.  Ways to decrease your exposure to dust mites in your home:  1. Encase mattresses, box springs and pillows with a mite-impermeable barrier or cover  2. Wash sheets, blankets and drapes weekly in hot water (130 F) with detergent and dry them in a dryer on the hot setting.  3. Have the room cleaned frequently with a vacuum cleaner and a damp dust-mop. For carpeting or rugs, vacuuming with a vacuum cleaner equipped with a high-efficiency particulate air (HEPA) filter. The dust mite allergic individual should not be in a room which is being cleaned and should wait 1 hour after  cleaning before going into the room.  4. Do not sleep on upholstered furniture (eg, couches).  5. If possible removing carpeting, upholstered furniture and drapery from the home is ideal. Horizontal blinds should be eliminated in the rooms where the person spends the most time (bedroom, study, television room). Washable vinyl, roller-type shades are optimal.  6. Remove all non-washable stuffed toys from the bedroom. Wash stuffed toys weekly like sheets and blankets above.  7. Reduce indoor humidity to less than 50%. Inexpensive humidity monitors can be purchased at most hardware stores. Do not use a humidifier as can make the problem worse and are not recommended.

## 2021-05-21 ENCOUNTER — Other Ambulatory Visit: Payer: Self-pay

## 2021-05-21 MED ORDER — EPINEPHRINE 0.3 MG/0.3ML IJ SOAJ: INTRAMUSCULAR | 3 refills | Status: DC

## 2021-05-25 ENCOUNTER — Other Ambulatory Visit: Payer: Self-pay

## 2021-07-21 ENCOUNTER — Ambulatory Visit: Payer: Medicare HMO | Admitting: Family Medicine

## 2021-08-12 ENCOUNTER — Ambulatory Visit: Payer: Medicare HMO | Admitting: Family Medicine

## 2021-08-12 ENCOUNTER — Other Ambulatory Visit: Payer: Self-pay

## 2021-08-12 ENCOUNTER — Encounter: Payer: Self-pay | Admitting: Family Medicine

## 2021-08-12 VITALS — BP 118/78 | HR 96 | Temp 97.6°F | Resp 20

## 2021-08-12 DIAGNOSIS — T63481A Toxic effect of venom of other arthropod, accidental (unintentional), initial encounter: Secondary | ICD-10-CM | POA: Insufficient documentation

## 2021-08-12 DIAGNOSIS — K219 Gastro-esophageal reflux disease without esophagitis: Secondary | ICD-10-CM

## 2021-08-12 DIAGNOSIS — L2084 Intrinsic (allergic) eczema: Secondary | ICD-10-CM

## 2021-08-12 DIAGNOSIS — J4550 Severe persistent asthma, uncomplicated: Secondary | ICD-10-CM | POA: Diagnosis not present

## 2021-08-12 DIAGNOSIS — T63481D Toxic effect of venom of other arthropod, accidental (unintentional), subsequent encounter: Secondary | ICD-10-CM

## 2021-08-12 DIAGNOSIS — J3089 Other allergic rhinitis: Secondary | ICD-10-CM

## 2021-08-12 DIAGNOSIS — J302 Other seasonal allergic rhinitis: Secondary | ICD-10-CM

## 2021-08-12 NOTE — Progress Notes (Signed)
[address] [place] [address] Dept: [phone]  FOLLOW UP NOTE  Patient ID: Elaine Byrd, female    DOB: 1946/05/25  Age: 75 y.o. MRN: [id number] Date of Office Visit: 08/12/2021  Assessment  Chief Complaint: Asthma  HPI Elaine Byrd is a 75 year old female who presents the clinic for follow-up visit.  She was last seen in this clinic on 05/19/2021 for evaluation of asthma, allergic rhinitis, atopic dermatitis, reflux, and stinging insect allergy.  At today's visit, she reports her asthma has been poorly controlled with symptoms including shortness of breath with activity, shortness of breath when yelling at the TV, fits of dry cough, and wheezing 1 to 2 days a week.  She denies wheezing at nighttime, however, she reports that she is sitting up in a chair in an effort to breathe better while sleeping over the last 3-4 nights.  She continues Breztri 2 puffs twice a day with a spacer, albuterol in the morning 20 minutes before using her Breztri inhaler, albuterol in the evening 20 minutes before using her Breztri inhaler, and albuterol several days a week with activity.  Allergic rhinitis is reported as moderately well controlled with symptoms occurring year-round including occasional clear rhinorrhea, occasional nasal congestion, fits of sneezes, and intermittent postnasal drainage.  She continues Claritin-D 10 mg once a day and is not using Flonase or nasal saline rinses.  Atopic dermatitis is reported as moderately well controlled with triamcinolone cream as needed.  Reflux is reported as well controlled with no symptoms including heartburn or vomiting with pantoprazole daily.  She has not had any encounters with stinging insects nor has she needed to use her epinephrine autoinjector since her last visit to this clinic.  She reports a fall resulting in a broken toe on her left foot after chasing a cockroach.  She reports bilateral lower extremity swelling over the last several weeks for  which she keeps both lower extremities elevated as much as possible.  She does continue Lasix daily and has recently taken an as needed dose of Lasix, however, she reports that she forgets to take this as needed dose frequently.  Her current medications are listed in the chart.   Drug Allergies:  Allergies  Allergen Reactions   Morphine And Related Other (See Comments)    hallucinations   Penicillins Other (See Comments)    Childhood reaction, cannot remember   Sulfa Antibiotics Other (See Comments) and Diarrhea    itchiness    Physical Exam: BP 118/78 (Patient Position: Sitting, Cuff Size: Normal)   Pulse 96   Temp 97.6 F (36.4 C) (Temporal)   Resp 20   SpO2 97%    Physical Exam Vitals reviewed.  Constitutional:      Appearance: Normal appearance.  HENT:     Head: Normocephalic and atraumatic.     Right Ear: Tympanic membrane normal.     Left Ear: Tympanic membrane normal.     Nose:     Comments: Bilateral nares slightly erythematous with clear nasal drainage noted.  Septal deviation noted.  Septal perforation noted.  Pharynx normal.  Ears normal.  Eyes normal.    Mouth/Throat:     Pharynx: Oropharynx is clear.  Eyes:     Conjunctiva/sclera: Conjunctivae normal.  Cardiovascular:     Rate and Rhythm: Normal rate and regular rhythm.     Heart sounds: Normal heart sounds. No murmur heard. Pulmonary:     Effort: Pulmonary effort is normal.     Breath  sounds: Normal breath sounds.     Comments: Lungs clear to auscultation Musculoskeletal:     Cervical back: Normal range of motion and neck supple.  Skin:    General: Skin is warm and dry.     Comments: Scattered bruising noted on her arms.  2+ pitting edema bilateral lower extremities  Neurological:     Mental Status: She is alert and oriented to person, place, and time.  Psychiatric:        Mood and Affect: Mood normal.        Behavior: Behavior normal.        Thought Content: Thought content normal.        Judgment:  Judgment normal.    Diagnostics: FVC 1.45, FEV1 1.06.  Predicted FVC 2.66, predicted FEV1 2.00.  Spirometry indicates moderate restriction.  Postbronchodilator FVC 0.86, FEV1 0.73.  Postbronchodilator indicates no significant bronchodilator response.  Assessment and Plan: 1. Not well controlled severe persistent asthma   2. Seasonal and perennial allergic rhinitis   3. Intrinsic atopic dermatitis   4. Gastroesophageal reflux disease without esophagitis   5. Allergic reaction to insect sting, accidental or unintentional, subsequent encounter     Patient Instructions  Moderate persistent asthma without complication/ACO Continue Brezrti 2 puffs twice a day with a spacer to prevent cough or wheeze Continue Ventolin 2 puffs with a spacer every 4 hours as needed for cough or wheeze You may use albuterol 2 puffs 5-15 minutes before activity to decrease cough and wheeze Continue Dupixent 300 mg subcutaneous injections every other week Recommend referral to Pulmonary specialist We have placed an order for 1 lab to help [place] measure your fluid levels.  We will call you when the result becomes available  Seasonal allergic rhinitis due to pollen Continue Claritin D 10 mg once a day as needed for a runny nose. Remember that Claritin D can raise your blood pressure Consider Flonase 1-2 sprays in each nostril once a day as needed for a stuffy nose.  In the right nostril, point the applicator out toward the right ear. In the left nostril, point the applicator out toward the left ear Continue azelastine 2 sprays in each nostril twice a day as needed for a runny nose or sneezing Consider saline nasal rinses once or twice a day (NeilMed is one example). 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  In case of an allergic reaction, take Benadryl 50 mg every 4 hours, and if life-threatening symptoms occur, inject with EpiPen 0.3 mg.  Call the clinic if this treatment plan is not working well for you  Follow up in 4 weeks or sooner if needed    Return in about 4 weeks (around 09/09/2021).    Thank you for the opportunity to care for this patient.  Please do not hesitate to contact me with questions.  Thermon Leyland, FNP [facility]

## 2021-08-12 NOTE — Patient Instructions (Addendum)
Moderate persistent asthma without complication/ACO Continue Brezrti 2 puffs twice a day with a spacer to prevent cough or wheeze Continue Ventolin 2 puffs with a spacer every 4 hours as needed for cough or wheeze You may use albuterol 2 puffs 5-15 minutes before activity to decrease cough and wheeze Continue Dupixent 300 mg subcutaneous injections every other week Recommend referral to Pulmonary specialist We have placed an order for 1 lab to help [place] measure your fluid levels.  We will call you when the result becomes available  Seasonal allergic rhinitis due to pollen Continue Claritin D 10 mg once a day as needed for a runny nose. Remember that Claritin D can raise your blood pressure Consider Flonase 1-2 sprays in each nostril once a day as needed for a stuffy nose.  In the right nostril, point the applicator out toward the right ear. In the left nostril, point the applicator out toward the left ear Continue azelastine 2 sprays in each nostril twice a day as needed for a runny nose or sneezing Consider saline nasal rinses once or twice a day (NeilMed is one example). 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  In case of an allergic reaction, take Benadryl 50 mg every 4 hours, and if life-threatening symptoms occur, inject with EpiPen 0.3 mg.  Call the clinic if this treatment plan is not working well for you  Follow up in 4 weeks or sooner if needed    Lifestyle Changes for Controlling  GERD When you have GERD, stomach acid feels as if it's backing up toward your mouth. Whether or not you take medication to control your GERD, your symptoms can often be improved with lifestyle changes.   Raise Your Head Reflux is more likely to strike when you're lying down flat, because stomach fluid can flow backward more easily. Raising the head of your bed 4-6 inches can help. To do this: Slide blocks or books under the legs at the head of your bed. Or, place a wedge under the mattress. Many foam stores can make a suitable wedge for you. The wedge should run from your waist to the top of your head. Don't just prop your head on several pillows. This increases pressure on your stomach. It can make GERD worse.  Watch Your Eating Habits Certain foods may increase the acid in your stomach or relax the lower esophageal sphincter, making GERD more likely. It's best to avoid the following: Coffee, tea, and carbonated drinks (with and without caffeine) Fatty, fried, or spicy food Mint, chocolate, onions, and tomatoes Any other foods that seem to irritate your stomach or cause you pain  Relieve the Pressure Eat smaller meals, even if you have to eat more often. Don't lie down right after you eat. Wait a few hours for your stomach to empty. Avoid tight belts and tight-fitting clothes. Lose excess weight.  Tobacco and Alcohol Avoid smoking tobacco and drinking alcohol. They can make GERD symptoms worse.  Reducing Pollen Exposure The American Academy of Allergy, Asthma and Immunology suggests the following steps  to reduce your exposure to pollen during allergy seasons. Do not hang sheets or clothing out to dry; pollen may collect on these items. Do not mow lawns or spend time around freshly cut grass; mowing stirs up pollen. Keep windows closed at night.  Keep car windows closed while driving. Minimize morning activities outdoors, a time when pollen counts are usually at their  highest. Stay indoors as much as possible when pollen counts or humidity is high and on windy days when pollen tends to remain in the air longer. Use air conditioning when possible.  Many air conditioners have filters that trap the pollen spores. Use a HEPA room air filter to remove pollen form the indoor air you breathe.  Control of Dog or Cat Allergen Avoidance is the best way to manage a dog or cat allergy. If you have a dog or cat and are allergic to dog or cats, consider removing the dog or cat from the home. If you have a dog or cat but don't want to find it a new home, or if your family wants a pet even though someone in the household is allergic, here are some strategies that may help keep symptoms at bay:  Keep the pet out of your bedroom and restrict it to only a few rooms. Be advised that keeping the dog or cat in only one room will not limit the allergens to that room. Don't pet, hug or kiss the dog or cat; if you do, wash your hands with soap and water. High-efficiency particulate air (HEPA) cleaners run continuously in a bedroom or living room can reduce allergen levels over time. Regular use of a high-efficiency vacuum cleaner or a central vacuum can reduce allergen levels. Giving your dog or cat a bath at least once a week can reduce airborne allergen.  Control of Mold Allergen Mold and fungi can grow on a variety of surfaces provided certain temperature and moisture conditions exist.  Outdoor molds grow on plants, decaying vegetation and soil.  The major outdoor mold, Alternaria and Cladosporium, are found in very high numbers during hot and dry conditions.  Generally, a late Summer - Fall peak is seen for common outdoor fungal spores.  Rain will temporarily lower outdoor mold spore count, but counts rise rapidly when the rainy period ends.  The most important indoor molds are Aspergillus and Penicillium.  Dark, humid and poorly ventilated basements are ideal sites for mold growth.   The next most common sites of mold growth are the bathroom and the kitchen.  Outdoor [employer] Use air conditioning and keep windows closed Avoid exposure to decaying vegetation. Avoid leaf raking. Avoid grain handling. Consider wearing a face mask if working in moldy areas.  Indoor Mold Control Maintain humidity below 50%. Clean washable surfaces with 5% bleach solution. Remove sources e.g. Contaminated carpets.   Control of Dust Mite Allergen Dust mites play a major role in allergic asthma and rhinitis. They occur in environments with high humidity wherever human skin is found. Dust mites absorb humidity from the atmosphere (ie, they do not drink) and feed on organic matter (including shed human and animal skin). Dust mites are a microscopic type of insect that you cannot see with the naked eye. High levels of dust mites have been detected from mattresses, pillows, carpets, upholstered furniture, bed covers, clothes, soft toys and any woven material. The principal allergen of the dust mite is found in its feces. A gram of dust may contain 1,000 mites and  250,000 fecal particles. Mite antigen is easily measured in the air during house cleaning activities. Dust mites do not bite and do not cause harm to humans, other than by triggering allergies/asthma.  Ways to decrease your exposure to dust mites in your home:  1. Encase mattresses, box springs and pillows with a mite-impermeable barrier or cover  2. Wash sheets, blankets and drapes weekly in hot water (130 F) with detergent and dry them in a dryer on the hot setting.  3. Have the room cleaned frequently with a vacuum cleaner and a damp dust-mop. For carpeting or rugs, vacuuming with a vacuum cleaner equipped with a high-efficiency particulate air (HEPA) filter. The dust mite allergic individual should not be in a room which is being cleaned and should wait 1 hour after cleaning before going into the room.  4. Do not sleep on  upholstered furniture (eg, couches).  5. If possible removing carpeting, upholstered furniture and drapery from the home is ideal. Horizontal blinds should be eliminated in the rooms where the person spends the most time (bedroom, study, television room). Washable vinyl, roller-type shades are optimal.  6. Remove all non-washable stuffed toys from the bedroom. Wash stuffed toys weekly like sheets and blankets above.  7. Reduce indoor humidity to less than 50%. Inexpensive humidity monitors can be purchased at most hardware stores. Do not use a humidifier as can make the problem worse and are not recommended.

## 2021-08-18 ENCOUNTER — Encounter: Payer: Self-pay | Admitting: Family Medicine

## 2021-08-19 NOTE — Progress Notes (Signed)
Patient has an existing relationship with [facility].  She is already scheduled for the following date.     09/02/2021 10:40 AM 09/02/2021 11:00 AM   Providers  Viviana Simpler, PA-C Physician Assistant NPI: [id number]

## 2021-08-19 NOTE — Telephone Encounter (Signed)
My apologies. I have ordered a BNP through lab copr. Is there any way to change this to her regular lab at [facility]?

## 2021-08-19 NOTE — Telephone Encounter (Signed)
I have no idea.

## 2021-08-20 NOTE — Telephone Encounter (Signed)
Can you please let this patient know that I left a message with Dr Barnett Abu office requesting that they draw the lab at her regular lab facility, [facility]. Thank you

## 2021-10-15 ENCOUNTER — Telehealth: Payer: Self-pay | Admitting: Family Medicine

## 2021-10-15 NOTE — Telephone Encounter (Signed)
Pt wanted you to know she had to cancel because she fell and just got out of rehab. She will call back when she can get a ride.

## 2021-10-19 ENCOUNTER — Ambulatory Visit: Payer: Medicare HMO | Admitting: Family Medicine

## 2021-12-16 ENCOUNTER — Telehealth: Payer: Self-pay | Admitting: *Deleted

## 2021-12-16 NOTE — Telephone Encounter (Signed)
Patient states that she is transitioning from walker to cane, she states that she is going to check to see if her son can take her to the office on a Monday and she will call to schedule an appointment. She states that her breathing is so-so today, it just depends on the day.

## 2021-12-16 NOTE — Telephone Encounter (Signed)
Thank you for checking in with her

## 2021-12-28 ENCOUNTER — Other Ambulatory Visit: Payer: Self-pay

## 2021-12-29 ENCOUNTER — Other Ambulatory Visit: Payer: Self-pay

## 2021-12-29 MED ORDER — BREZTRI AEROSPHERE 160-9-4.8 MCG/ACT IN AERO: 2.0000 | INHALATION_SPRAY | Freq: Two times a day (BID) | RESPIRATORY_TRACT | 1 refills | Status: DC

## 2021-12-29 NOTE — Telephone Encounter (Signed)
Refill for Breztri x 1 with 1 refill at [facility].

## 2022-01-06 ENCOUNTER — Other Ambulatory Visit: Payer: Self-pay

## 2022-01-09 ENCOUNTER — Encounter: Payer: Self-pay | Admitting: Family Medicine

## 2022-01-11 ENCOUNTER — Ambulatory Visit: Payer: Medicare HMO | Admitting: Family Medicine

## 2022-02-17 ENCOUNTER — Other Ambulatory Visit: Payer: Self-pay

## 2022-02-17 ENCOUNTER — Ambulatory Visit (INDEPENDENT_AMBULATORY_CARE_PROVIDER_SITE_OTHER): Payer: Medicare HMO | Admitting: Family Medicine

## 2022-02-17 ENCOUNTER — Encounter: Payer: Self-pay | Admitting: Family Medicine

## 2022-02-17 DIAGNOSIS — K219 Gastro-esophageal reflux disease without esophagitis: Secondary | ICD-10-CM

## 2022-02-17 DIAGNOSIS — T63481D Toxic effect of venom of other arthropod, accidental (unintentional), subsequent encounter: Secondary | ICD-10-CM

## 2022-02-17 DIAGNOSIS — J3089 Other allergic rhinitis: Secondary | ICD-10-CM

## 2022-02-17 DIAGNOSIS — J4550 Severe persistent asthma, uncomplicated: Secondary | ICD-10-CM

## 2022-02-17 DIAGNOSIS — J302 Other seasonal allergic rhinitis: Secondary | ICD-10-CM

## 2022-02-17 DIAGNOSIS — L2084 Intrinsic (allergic) eczema: Secondary | ICD-10-CM

## 2022-02-17 NOTE — Progress Notes (Signed)
? ?RE: Elaine Byrd MRN: [id number] DOB: [birth year] ?Date of Telemedicine Visit: 02/17/2022 ? ?Referring provider: Laqueta Due., MD ?Primary care provider: Laqueta Due., MD ? ?Chief Complaint: Asthma ? ? ?Telemedicine Follow Up Visit via Telephone: ?I connected with Woodward Ku for a follow up on 02/17/22 by telephone and verified that I am speaking with the correct person using two identifiers. ?  ?I discussed the limitations, risks, security and privacy concerns of performing an evaluation and management service by telephone and the availability of in person appointments. I also discussed with the patient that there may be a patient responsible charge related to this service. The patient expressed understanding and agreed to proceed. ? ?Patient is at home  ?Provider is at the office.  ?Visit start time: 1129 ?Visit end time: 1225 ?Insurance consent/check in by: Sherri ?Medical consent and medical assistant/nurse: Marcelino Duster ? ?History of Present Illness: ?She is a 76 y.o. female, who is being followed for asthma, allergic rhinitis, atopic dermatitis, reflux, and allergy to stinging insects. Her previous allergy office visit was on 08/12/2021 with Thermon Leyland, FNP.  In the interim, she fell at home and was admitted to the hospital on 08/30/2021 followed by a stay in a rehabilitation setting.  She continues to struggle with mobility issues which have been complicated by gouty arthritis and is currently using a walker.  At today's visit, she reports her asthma has been moderately well controlled with symptoms including shortness of breath and wheeze with activity and occasional dry hacking cough.  She reports the symptoms occur at this time each year.  She continues Breztri 2 puffs twice a day with a spacer and uses albuterol daily on a routine basis.  She does report relief of symptoms with albuterol.  Allergic rhinitis is reported as well controlled while continuing Claritin-D once or twice a day.  She reports that she  has tried cetirizine, levocetirizine, fexofenadine, and loratadine with no relief of symptoms.   She is aware that Claritin-D may increase her blood pressure.  She does not use any nasal saline, Flonase, or azelastine as she reports all of these interventions make her nose dry and cause epistaxis.  Reflux is reported as well controlled with no symptoms including heartburn or vomiting and she continues pantoprazole daily.  Atopic dermatitis is reported as moderately well controlled with intermittent areas of dry and flaky skin for which she is using a twice a day moisturizing routine.  She infrequently needs to use triamcinolone cream.  She continues Dupixent injections once every 2 weeks with no large or local reactions.  She reports a significant decrease in her symptoms of atopic dermatitis while continuing on Dupixent injections. She reports that she has not had any interaction with stinging insects and has not needed to use her EpiPen since her last visit to this clinic.  Her current medications are listed in the chart. ? ?Assessment and Plan: ?Torah is a 76 y.o. female with: ?Patient Instructions  ?Moderate persistent asthma without complication/ACO ?Continue Brezrti 2 puffs twice a day with a spacer to prevent cough or wheeze ?Continue albuterol 2 puffs with a spacer every 4 hours as needed for cough or wheeze ?You may use albuterol 2 puffs 5-15 minutes before activity to decrease cough and wheeze ?We will get a spirometry reading at your next visit in the clinic.  If no improvement in your symptoms, a referral to a pulmonary specialist may be necessary ? ?Seasonal allergic rhinitis due to pollen ?Continue Claritin D  10 mg once a day as needed for a runny nose. Remember that Claritin D can raise your blood pressure ?Consider Flonase 1-2 sprays in each nostril once a day as needed for a stuffy nose.  In the right nostril, point the applicator out toward the right ear. In the left nostril, point the applicator  out toward the left ear ?Continue azelastine 2 sprays in each nostril twice a day as needed for a runny nose or sneezing ?Consider saline nasal rinses once or twice a day (NeilMed is one example). Use this before any medicated nasal sprays ?Continue allergen avoidance measures directed toward grass pollen, weed pollen, tree pollen, dust mite, cat, and molds as listed below ? ?Atopic dermatitis ?Continue a daily moisturizing routine as you have been ?Continue triamcinolone 0.1% cream twice a day as needed to red, itchy areas below your face ?Continue Dupixent 300 mg subcutaneous injections every other week ? ?Gastroesophageal reflux ?Continue taking pantoprazole 40 mg once a day as previously prescribed.  Try taking this medication about 30 minutes before your meal for best results ?Continue dietary and lifestyle modifications as listed below ? ?Stinging insect allergy ?Continue to avoid stinging insects.  In case of an allergic reaction, take Benadryl 50 mg every 4 hours, and if life-threatening symptoms occur, inject with EpiPen 0.3 mg. ? ?Call the clinic if this treatment plan is not working well for you ? ?Follow up in the clinic in 2 months or sooner if needed  ? ?.  ?Return in about 2 months (around 04/19/2022), or if symptoms worsen or fail to improve. ? ?No orders of the defined types were placed in this encounter. ? ? ?Medication List:  ?Current Outpatient Medications  ?Medication Sig Dispense Refill  ? acetaminophen (TYLENOL) 500 MG tablet Take by mouth.    ? albuterol (VENTOLIN HFA) 108 (90 Base) MCG/ACT inhaler Inhale 2 puffs into the lungs every 4 (four) hours as needed for wheezing or shortness of breath. 18 g 1  ? allopurinol (ZYLOPRIM) 300 MG tablet Take by mouth.    ? B Complex-Biotin-FA (B COMPLETE PO) Take 50 mg by mouth daily.    ? Bioflavonoid Products (ESTER-C) 500-550 MG TABS Take by mouth.    ? Budeson-Glycopyrrol-Formoterol (BREZTRI AEROSPHERE) 160-9-4.8 MCG/ACT AERO Inhale 2 puffs into the  lungs 2 (two) times daily. 10.7 g 1  ? clonazePAM (KLONOPIN) 2 MG tablet Take by mouth.    ? co-enzyme Q-10 30 MG capsule Take 100 mg by mouth daily.    ? Cyanocobalamin (B-12) 2500 MCG TABS Take by mouth.    ? dupilumab (DUPIXENT) 300 MG/2ML prefilled syringe Inject 300 mg into the skin every 14 (fourteen) days.    ? EPINEPHrine 0.3 mg/0.3 mL IJ SOAJ injection Use as directed for severe allergic reactions 4 each 3  ? furosemide (LASIX) 20 MG tablet Take 20 mg by mouth every morning.    ? hydrOXYzine (ATARAX/VISTARIL) 25 MG tablet Take 1 tablet (25 mg total) by mouth 3 (three) times daily. 90 tablet 3  ? Lactobacillus Rhamnosus, GG, (RA PROBIOTIC DIGESTIVE CARE) CAPS Take by mouth.    ? levothyroxine (SYNTHROID, LEVOTHROID) 100 MCG tablet Take 100 mcg by mouth daily before breakfast.    ? loperamide (IMODIUM) 2 MG capsule Take by mouth.    ? Loratadine-Pseudoephedrine (CLARITIN-D 12 HOUR PO) Take by mouth.    ? Multiple Vitamins-Minerals (MULTIVITAMIN ADULT) TABS Take by mouth.    ? pantoprazole (PROTONIX) 40 MG tablet Take 1 tablet by mouth daily.    ?  potassium chloride (KLOR-CON M) 10 MEQ tablet Take by mouth.    ? Probiotic Product (CULTURELLE PROBIOTICS PO) Take by mouth 2 (two) times daily.    ? tiZANidine (ZANAFLEX) 4 MG tablet Take 1 tablet by mouth 3 (three) times daily as needed.    ? tiZANidine (ZANAFLEX) 4 MG tablet Take by mouth.    ? Vitamin D-Vitamin K (VITAMIN K2-VITAMIN D3 PO) Take 5,000 mg by mouth daily.    ? ?No current facility-administered medications for this visit.  ? ?Allergies: ?Allergies  ?Allergen Reactions  ? Morphine And Related Other (See Comments)  ?  hallucinations  ? Penicillins Other (See Comments)  ?  Childhood reaction, cannot remember  ? Sulfa Antibiotics Other (See Comments) and Diarrhea  ?  itchiness  ? ?I reviewed her past medical history, social history, family history, and environmental history and no significant changes have been reported from previous visit on  08/12/2021. ? ? ?Objective: ?Physical Exam ?Not obtained as encounter was done via telephone.  ? ?Previous notes and tests were reviewed. ? ?I discussed the assessment and treatment plan with the patient. The patient

## 2022-02-17 NOTE — Patient Instructions (Addendum)
Moderate persistent asthma without complication/ACO ?Continue Brezrti 2 puffs twice a day with a spacer to prevent cough or wheeze ?Continue albuterol 2 puffs with a spacer every 4 hours as needed for cough or wheeze ?You may use albuterol 2 puffs 5-15 minutes before activity to decrease cough and wheeze ?We will get a spirometry reading at your next visit in the clinic.  If no improvement in your symptoms, a referral to a pulmonary specialist may be necessary ? ?Seasonal allergic rhinitis due to pollen ?Continue Claritin D 10 mg once a day as needed for a runny nose. Remember that Claritin D can raise your blood pressure ?Consider Flonase 1-2 sprays in each nostril once a day as needed for a stuffy nose.  In the right nostril, point the applicator out toward the right ear. In the left nostril, point the applicator out toward the left ear ?Continue azelastine 2 sprays in each nostril twice a day as needed for a runny nose or sneezing ?Consider saline nasal rinses once or twice a day (NeilMed is one example). Use this before any medicated nasal sprays ?Continue allergen avoidance measures directed toward grass pollen, weed pollen, tree pollen, dust mite, cat, and molds as listed below ? ?Atopic dermatitis ?Continue a daily moisturizing routine as you have been ?Continue triamcinolone 0.1% cream twice a day as needed to red, itchy areas below your face ?Continue Dupixent 300 mg subcutaneous injections every other week ? ?Gastroesophageal reflux ?Continue taking pantoprazole 40 mg once a day as previously prescribed.  Try taking this medication about 30 minutes before your meal for best results ?Continue dietary and lifestyle modifications as listed below ? ?Stinging insect allergy ?Continue to avoid stinging insects.  In case of an allergic reaction, take Benadryl 50 mg every 4 hours, and if life-threatening symptoms occur, inject with EpiPen 0.3 mg. ? ?Call the clinic if this treatment plan is not working well for  you ? ?Follow up in the clinic in 2 months or sooner if needed  ? ? ?Lifestyle Changes for Controlling GERD ?When you have GERD, stomach acid feels as if it?s backing up toward your mouth. ?Whether or not you take medication to control your GERD, your symptoms can often be ?improved with lifestyle changes.  ? ?Raise Your Head ?Reflux is more likely to strike when you?re lying down flat, because stomach fluid can ?flow backward more easily. Raising the head of your bed 4-6 inches can help. To do this: ?Slide blocks or books under the legs at the head of your bed. Or, place a wedge under ?the mattress. Many foam stores can make a suitable wedge for you. The wedge ?should run from your waist to the top of your head. ?Don?t just prop your head on several pillows. This increases pressure on your ?stomach. It can make GERD worse. ? ?Watch Your Eating Habits ?Certain foods may increase the acid in your stomach or relax the lower esophageal ?sphincter, making GERD more likely. It?s best to avoid the following: ?Coffee, tea, and carbonated drinks (with and without caffeine) ?Fatty, fried, or spicy food ?Mint, chocolate, onions, and tomatoes ?Any other foods that seem to irritate your stomach or cause you pain ? ?Relieve the Pressure ?Eat smaller meals, even if you have to eat more often. ?Don?t lie down right after you eat. Wait a few hours for your stomach to empty. ?Avoid tight belts and tight-fitting clothes. ?Lose excess weight. ? ?Tobacco and Alcohol ?Avoid smoking tobacco and drinking alcohol. They can make GERD  symptoms worse. ? ?Reducing Pollen Exposure ?The American Academy of Allergy, Asthma and Immunology suggests the following steps to reduce your exposure to pollen during allergy seasons. ?Do not hang sheets or clothing out to dry; pollen may collect on these items. ?Do not mow lawns or spend time around freshly cut grass; mowing stirs up pollen. ?Keep windows closed at night.  Keep car windows closed while  driving. ?Minimize morning activities outdoors, a time when pollen counts are usually at their highest. ?Stay indoors as much as possible when pollen counts or humidity is high and on windy days when pollen tends to remain in the air longer. ?Use air conditioning when possible.  Many air conditioners have filters that trap the pollen spores. ?Use a HEPA room air filter to remove pollen form the indoor air you breathe. ? ?Control of Dog or Cat Allergen ?Avoidance is the best way to manage a dog or cat allergy. If you have a dog or cat and are allergic to dog or cats, consider removing the dog or cat from the home. ?If you have a dog or cat but don?t want to find it a new home, or if your family wants a pet even though someone in the household is allergic, here are some strategies that may help keep symptoms at bay: ? ?Keep the pet out of your bedroom and restrict it to only a few rooms. Be advised that keeping the dog or cat in only one room will not limit the allergens to that room. ?Don?t pet, hug or kiss the dog or cat; if you do, wash your hands with soap and water. ?High-efficiency particulate air (HEPA) cleaners run continuously in a bedroom or living room can reduce allergen levels over time. ?Regular use of a high-efficiency vacuum cleaner or a central vacuum can reduce allergen levels. ?Giving your dog or cat a bath at least once a week can reduce airborne allergen. ? ?Control of Mold Allergen ?Mold and fungi can grow on a variety of surfaces provided certain temperature and moisture conditions exist.  Outdoor molds grow on plants, decaying vegetation and soil.  The major outdoor mold, Alternaria and Cladosporium, are found in very high numbers during hot and dry conditions.  Generally, a late Summer - Fall peak is seen for common outdoor fungal spores.  Rain will temporarily lower outdoor mold spore count, but counts rise rapidly when the rainy period ends.  The most important indoor molds are Aspergillus  and Penicillium.  Dark, humid and poorly ventilated basements are ideal sites for mold growth.  The next most common sites of mold growth are the bathroom and the kitchen. ? ?Outdoor [employer] ?Use air conditioning and keep windows closed ?Avoid exposure to decaying vegetation. ?Avoid leaf raking. ?Avoid grain handling. ?Consider wearing a face mask if working in moldy areas. ? ?Indoor Mold Control ?Maintain humidity below 50%. ?Clean washable surfaces with 5% bleach solution. ?Remove sources e.g. Contaminated carpets. ? ? ?Control of Dust Mite Allergen ?Dust mites play a major role in allergic asthma and rhinitis. They occur in environments with high humidity wherever human skin is found. Dust mites absorb humidity from the atmosphere (ie, they do not drink) and feed on organic matter (including shed human and animal skin). Dust mites are a microscopic type of insect that you cannot see with the naked eye. High levels of dust mites have been detected from mattresses, pillows, carpets, upholstered furniture, bed covers, clothes, soft toys and any woven material. The principal allergen  of the dust mite is found in its feces. A gram of dust may contain 1,000 mites and 250,000 fecal particles. Mite antigen is easily measured in the air during house cleaning activities. Dust mites do not bite and do not cause harm to humans, other than by triggering allergies/asthma. ? ?Ways to decrease your exposure to dust mites in your home: ? ?1. Encase mattresses, box springs and pillows with a mite-impermeable barrier or cover ? ?2. Wash sheets, blankets and drapes weekly in hot water (130? F) with detergent and dry them in a dryer on the hot setting. ? ?3. Have the room cleaned frequently with a vacuum cleaner and a damp dust-mop. For carpeting or rugs, vacuuming with a vacuum cleaner equipped with a high-efficiency particulate air (HEPA) filter. The dust mite allergic individual should not be in a room which is being cleaned  and should wait 1 hour after cleaning before going into the room. ? ?4. Do not sleep on upholstered furniture (eg, couches). ? ?5. If possible removing carpeting, upholstered furniture and drapery from the h

## 2022-02-19 ENCOUNTER — Encounter: Payer: Self-pay | Admitting: Family Medicine

## 2022-02-22 ENCOUNTER — Other Ambulatory Visit: Payer: Self-pay

## 2022-02-22 MED ORDER — BREZTRI AEROSPHERE 160-9-4.8 MCG/ACT IN AERO: 2.0000 | INHALATION_SPRAY | Freq: Two times a day (BID) | RESPIRATORY_TRACT | 5 refills | Status: DC

## 2022-02-22 NOTE — Telephone Encounter (Signed)
Refill for Breziri refill sent to [facility] per pt verifying coverage. ?

## 2022-04-28 ENCOUNTER — Ambulatory Visit: Payer: Medicare HMO | Admitting: Family Medicine

## 2022-06-14 ENCOUNTER — Ambulatory Visit: Payer: Medicare HMO | Admitting: Family Medicine

## 2022-07-14 ENCOUNTER — Encounter: Payer: Self-pay | Admitting: Family Medicine

## 2022-07-14 ENCOUNTER — Ambulatory Visit: Payer: Medicare HMO | Admitting: Family Medicine

## 2022-07-14 VITALS — BP 118/70 | HR 90 | Temp 97.6°F | Resp 20 | Ht 62.0 in

## 2022-07-14 DIAGNOSIS — T63481D Toxic effect of venom of other arthropod, accidental (unintentional), subsequent encounter: Secondary | ICD-10-CM

## 2022-07-14 DIAGNOSIS — K219 Gastro-esophageal reflux disease without esophagitis: Secondary | ICD-10-CM | POA: Diagnosis not present

## 2022-07-14 DIAGNOSIS — J302 Other seasonal allergic rhinitis: Secondary | ICD-10-CM

## 2022-07-14 DIAGNOSIS — L2084 Intrinsic (allergic) eczema: Secondary | ICD-10-CM | POA: Diagnosis not present

## 2022-07-14 DIAGNOSIS — J3089 Other allergic rhinitis: Secondary | ICD-10-CM

## 2022-07-14 DIAGNOSIS — J4550 Severe persistent asthma, uncomplicated: Secondary | ICD-10-CM

## 2022-07-14 MED ORDER — EPINEPHRINE 0.3 MG/0.3ML IJ SOAJ: INTRAMUSCULAR | 3 refills | Status: DC

## 2022-07-14 NOTE — Patient Instructions (Addendum)
Moderate persistent asthma without complication/ACO Continue Brezrti 2 puffs twice a day with a spacer to prevent cough or wheeze Continue albuterol 2 puffs with a spacer every 4 hours as needed for cough or wheeze You may use albuterol 2 puffs 5-15 minutes before activity to decrease cough and wheeze We will get a spirometry reading at your next visit in the clinic.  If no improvement in your symptoms, a referral to a pulmonary specialist may be necessary  Seasonal allergic rhinitis due to pollen Continue Claritin D 10 mg once a day as needed for a runny nose. Remember that Claritin D can raise your blood pressure Consider Flonase 1-2 sprays in each nostril once a day as needed for a stuffy nose.  In the right nostril, point the applicator out toward the right ear. In the left nostril, point the applicator out toward the left ear Continue azelastine 2 sprays in each nostril twice a day as needed for a runny nose or sneezing Consider saline nasal rinses once or twice a day (NeilMed is one example). Use this before any medicated nasal sprays Continue allergen avoidance measures directed toward grass pollen, weed pollen, tree pollen, dust mite, cat, and molds as listed below  Atopic dermatitis Continue a daily moisturizing routine as you have been Continue triamcinolone 0.1% cream twice a day as needed to red, itchy areas below your face Continue Dupixent 300 mg subcutaneous injections every other week  Gastroesophageal reflux Continue taking pantoprazole 40 mg once a day as previously prescribed.  Try taking this medication about 30 minutes before your meal for best results Continue dietary and lifestyle modifications as listed below  Stinging insect allergy Continue to avoid stinging insects.  In case of an allergic reaction, take Benadryl 50 mg every 4 hours, and if life-threatening symptoms occur, inject with EpiPen 0.3 mg.  Call the clinic if this treatment plan is not working well for  you  Follow up in the clinic in 3 months or sooner if needed    Lifestyle Changes for Controlling GERD When you have GERD, stomach acid feels as if it's backing up toward your mouth. Whether or not you take medication to control your GERD, your symptoms can often be improved with lifestyle changes.   Raise Your Head Reflux is more likely to strike when you're lying down flat, because stomach fluid can flow backward more easily. Raising the head of your bed 4-6 inches can help. To do this: Slide blocks or books under the legs at the head of your bed. Or, place a wedge under the mattress. Many foam stores can make a suitable wedge for you. The wedge should run from your waist to the top of your head. Don't just prop your head on several pillows. This increases pressure on your stomach. It can make GERD worse.  Watch Your Eating Habits Certain foods may increase the acid in your stomach or relax the lower esophageal sphincter, making GERD more likely. It's best to avoid the following: Coffee, tea, and carbonated drinks (with and without caffeine) Fatty, fried, or spicy food Mint, chocolate, onions, and tomatoes Any other foods that seem to irritate your stomach or cause you pain  Relieve the Pressure Eat smaller meals, even if you have to eat more often. Don't lie down right after you eat. Wait a few hours for your stomach to empty. Avoid tight belts and tight-fitting clothes. Lose excess weight.  Tobacco and Alcohol Avoid smoking tobacco and drinking alcohol. They can make GERD  symptoms worse.  Reducing Pollen Exposure The American Academy of Allergy, Asthma and Immunology suggests the following steps to reduce your exposure to pollen during allergy seasons. Do not hang sheets or clothing out to dry; pollen may collect on these items. Do not mow lawns or spend time around freshly cut grass; mowing stirs up pollen. Keep windows closed at night.  Keep car windows closed while  driving. Minimize morning activities outdoors, a time when pollen counts are usually at their highest. Stay indoors as much as possible when pollen counts or humidity is high and on windy days when pollen tends to remain in the air longer. Use air conditioning when possible.  Many air conditioners have filters that trap the pollen spores. Use a HEPA room air filter to remove pollen form the indoor air you breathe.  Control of Dog or Cat Allergen Avoidance is the best way to manage a dog or cat allergy. If you have a dog or cat and are allergic to dog or cats, consider removing the dog or cat from the home. If you have a dog or cat but don't want to find it a new home, or if your family wants a pet even though someone in the household is allergic, here are some strategies that may help keep symptoms at bay:  Keep the pet out of your bedroom and restrict it to only a few rooms. Be advised that keeping the dog or cat in only one room will not limit the allergens to that room. Don't pet, hug or kiss the dog or cat; if you do, wash your hands with soap and water. High-efficiency particulate air (HEPA) cleaners run continuously in a bedroom or living room can reduce allergen levels over time. Regular use of a high-efficiency vacuum cleaner or a central vacuum can reduce allergen levels. Giving your dog or cat a bath at least once a week can reduce airborne allergen.  Control of Mold Allergen Mold and fungi can grow on a variety of surfaces provided certain temperature and moisture conditions exist.  Outdoor molds grow on plants, decaying vegetation and soil.  The major outdoor mold, Alternaria and Cladosporium, are found in very high numbers during hot and dry conditions.  Generally, a late Summer - Fall peak is seen for common outdoor fungal spores.  Rain will temporarily lower outdoor mold spore count, but counts rise rapidly when the rainy period ends.  The most important indoor molds are Aspergillus  and Penicillium.  Dark, humid and poorly ventilated basements are ideal sites for mold growth.  The next most common sites of mold growth are the bathroom and the kitchen.  Outdoor [employer] Use air conditioning and keep windows closed Avoid exposure to decaying vegetation. Avoid leaf raking. Avoid grain handling. Consider wearing a face mask if working in moldy areas.  Indoor Mold Control Maintain humidity below 50%. Clean washable surfaces with 5% bleach solution. Remove sources e.g. Contaminated carpets.   Control of Dust Mite Allergen Dust mites play a major role in allergic asthma and rhinitis. They occur in environments with high humidity wherever human skin is found. Dust mites absorb humidity from the atmosphere (ie, they do not drink) and feed on organic matter (including shed human and animal skin). Dust mites are a microscopic type of insect that you cannot see with the naked eye. High levels of dust mites have been detected from mattresses, pillows, carpets, upholstered furniture, bed covers, clothes, soft toys and any woven material. The principal allergen  of the dust mite is found in its feces. A gram of dust may contain 1,000 mites and 250,000 fecal particles. Mite antigen is easily measured in the air during house cleaning activities. Dust mites do not bite and do not cause harm to humans, other than by triggering allergies/asthma.  Ways to decrease your exposure to dust mites in your home:  1. Encase mattresses, box springs and pillows with a mite-impermeable barrier or cover  2. Wash sheets, blankets and drapes weekly in hot water (130 F) with detergent and dry them in a dryer on the hot setting.  3. Have the room cleaned frequently with a vacuum cleaner and a damp dust-mop. For carpeting or rugs, vacuuming with a vacuum cleaner equipped with a high-efficiency particulate air (HEPA) filter. The dust mite allergic individual should not be in a room which is being cleaned  and should wait 1 hour after cleaning before going into the room.  4. Do not sleep on upholstered furniture (eg, couches).  5. If possible removing carpeting, upholstered furniture and drapery from the home is ideal. Horizontal blinds should be eliminated in the rooms where the person spends the most time (bedroom, study, television room). Washable vinyl, roller-type shades are optimal.  6. Remove all non-washable stuffed toys from the bedroom. Wash stuffed toys weekly like sheets and blankets above.  7. Reduce indoor humidity to less than 50%. Inexpensive humidity monitors can be purchased at most hardware stores. Do not use a humidifier as can make the problem worse and are not recommended. Marland Kitchen

## 2022-07-14 NOTE — Progress Notes (Signed)
[address] [place] [address] Dept: [phone]  FOLLOW UP NOTE  Patient ID: Elaine Byrd, female    DOB: [birth year]  Age: 76 y.o. MRN: [id number] Date of Office Visit: 07/14/2022  Assessment  Chief Complaint: Follow-up (Pt states that she's been following her treatment plan and she have some good and bad days with her symptoms. She have been using her albuterol daily.)  HPI Elaine Byrd is a 76 year old female who presents to the clinic for follow-up visit.  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  At today's visit, she reports her asthma has been moderately well controlled with occasional shortness of breath, occasional wheeze, and frequent coughing producing thin clear mucus.  She continues albuterol followed by Markus Daft 2 puffs twice a day with a spacer and uses albuterol for asthma symptoms 0 to 3 days a week with relief of symptoms.  A referral to a pulmonologist was offered at this visit and last visit and patient is not interested in seeing a pulmonologist at this time.  She will call the clinic if she becomes interested in a pulmonology referral.  Allergic rhinitis is reported as moderately well controlled with symptoms including occasional clear rhinorrhea, sneezing, and postnasal drainage with frequent throat clearing.  She continues Claritin-D daily and is not currently using any nasal sprays or saline nasal rinses.  Her last environmental allergy testing was on 01/29/2019 was positive to weed pollen, grass pollen, tree pollen, dust mite, cat, and mold.  She reports frequent eustachian tube dysfunction with symptoms including ears feeling " congested" and occasional muffled hearing.  Atopic dermatitis is reported as moderately well controlled with occasional dry, flaky, and itchy areas occurring in a flare in remission pattern.  She  continues a twice a day moisturizing routine and rarely needs to use triamcinolone.  She continues Dupixent once every 2 weeks with no large or local reactions.  She reports a significant decrease in her symptoms of atopic dermatitis while continuing Dupixent.  She has not had any encounters with stinging insects since her last visit to this clinic.  Her current medications are listed in the chart.   Drug Allergies:  Allergies  Allergen Reactions   Morphine And Related Other (See Comments)    hallucinations   Penicillins Other (See Comments)    Childhood reaction, cannot remember   Sulfa Antibiotics Other (See Comments) and Diarrhea    itchiness    Physical Exam: BP 118/70   Pulse 90   Temp 97.6 F (36.4 C) (Temporal)   Resp 20   Ht 5\' 2"  (1.575 m)   SpO2 96%   BMI 35.48 kg/m    Physical Exam Vitals reviewed.  Constitutional:      Appearance: Normal appearance.  HENT:     Head: Normocephalic and atraumatic.     Right Ear: Tympanic membrane normal.     Left Ear: Tympanic membrane normal.     Nose:     Comments: Bilateral nares slightly erythematous with clear nasal drainage noted.  Pharynx normal.  Ears normal.  Eyes normal.    Mouth/Throat:     Pharynx: Oropharynx is clear.  Eyes:     Conjunctiva/sclera: Conjunctivae normal.  Cardiovascular:     Rate and Rhythm: Normal rate and regular rhythm.     Heart sounds: Normal heart sounds. No murmur heard. Pulmonary:  Effort: Pulmonary effort is normal.     Breath sounds: Normal breath sounds.     Comments: Lungs clear to auscultation Musculoskeletal:        General: Normal range of motion.     Cervical back: Normal range of motion and neck supple.  Skin:    General: Skin is warm and dry.  Neurological:     Mental Status: She is alert and oriented to person, place, and time.  Psychiatric:        Mood and Affect: Mood normal.        Behavior: Behavior normal.        Thought Content: Thought content normal.         Judgment: Judgment normal.     Diagnostics: FVC 1.31, FEV1 1.02.  Predicted FVC 2.54, predicted FEV1 1.96.  Spirometry indicates restriction.  This is consistent with previous spirometry readings.  Assessment and Plan: 1. Severe persistent asthma without complication   2. Gastroesophageal reflux disease without esophagitis   3. Intrinsic atopic dermatitis   4. Seasonal and perennial allergic rhinitis   5. Allergic reaction to insect sting, accidental or unintentional, subsequent encounter     Meds ordered this encounter  Medications   EPINEPHrine 0.3 mg/0.3 mL IJ SOAJ injection    Sig: Use as directed for severe allergic reactions    Dispense:  4 each    Refill:  3    Dispense M[employer] generic brand only    Patient Instructions  Moderate persistent asthma without complication/ACO Continue Brezrti 2 puffs twice a day with a spacer to prevent cough or wheeze Continue albuterol 2 puffs with a spacer every 4 hours as needed for cough or wheeze You may use albuterol 2 puffs 5-15 minutes before activity to decrease cough and wheeze We will get a spirometry reading at your next visit in the clinic.  If no improvement in your symptoms, a referral to a pulmonary specialist may be necessary  Seasonal allergic rhinitis due to pollen Continue Claritin D 10 mg once a day as needed for a runny nose. Remember that Claritin D can raise your blood pressure Consider Flonase 1-2 sprays in each nostril once a day as needed for a stuffy nose.  In the right nostril, point the applicator out toward the right ear. In the left nostril, point the applicator out toward the left ear Continue azelastine 2 sprays in each nostril twice a day as needed for a runny nose or sneezing Consider saline nasal rinses once or twice a day (NeilMed is one example). Use this before any medicated nasal sprays Continue allergen avoidance measures directed toward grass pollen, weed pollen, tree pollen, dust mite, cat, and molds  as listed below  Atopic dermatitis Continue a daily moisturizing routine as you have been Continue triamcinolone 0.1% cream twice a day as needed to red, itchy areas below your face Continue Dupixent 300 mg subcutaneous injections every other week  Gastroesophageal reflux Continue taking pantoprazole 40 mg once a day as previously prescribed.  Try taking this medication about 30 minutes before your meal for best results Continue dietary and lifestyle modifications as listed below  Stinging insect allergy Continue to avoid stinging insects.  In case of an allergic reaction, take Benadryl 50 mg every 4 hours, and if life-threatening symptoms occur, inject with EpiPen 0.3 mg.  Call the clinic if this treatment plan is not working well for you  Follow up in the clinic in 3 months or sooner if needed  .  Return in about 3 months (around 10/14/2022), or if symptoms worsen or fail to improve.    Thank you for the opportunity to care for this patient.  Please do not hesitate to contact me with questions.  Thermon Leyland, FNP [facility]

## 2022-07-16 ENCOUNTER — Other Ambulatory Visit: Payer: Self-pay | Admitting: *Deleted

## 2022-07-16 MED ORDER — EPINEPHRINE 0.3 MG/0.3ML IJ SOAJ: INTRAMUSCULAR | 3 refills | Status: AC

## 2022-07-17 ENCOUNTER — Other Ambulatory Visit: Payer: Self-pay | Admitting: Family Medicine

## 2022-09-08 ENCOUNTER — Emergency Department (HOSPITAL_COMMUNITY): Payer: Medicare HMO

## 2022-09-08 ENCOUNTER — Inpatient Hospital Stay (HOSPITAL_COMMUNITY)
Admission: EM | Admit: 2022-09-08 | Discharge: 2022-09-14 | DRG: 482 | Disposition: A | Discharge status: Skilled Nursing Facility | Payer: Medicare HMO | Attending: Internal Medicine | Admitting: Internal Medicine

## 2022-09-08 ENCOUNTER — Encounter (HOSPITAL_COMMUNITY): Payer: Self-pay

## 2022-09-08 DIAGNOSIS — K529 Noninfective gastroenteritis and colitis, unspecified: Secondary | ICD-10-CM | POA: Diagnosis present

## 2022-09-08 DIAGNOSIS — M109 Gout, unspecified: Secondary | ICD-10-CM | POA: Diagnosis present

## 2022-09-08 DIAGNOSIS — S72001A Fracture of unspecified part of neck of right femur, initial encounter for closed fracture: Principal | ICD-10-CM

## 2022-09-08 DIAGNOSIS — F32A Depression, unspecified: Secondary | ICD-10-CM | POA: Diagnosis present

## 2022-09-08 DIAGNOSIS — E039 Hypothyroidism, unspecified: Secondary | ICD-10-CM | POA: Diagnosis present

## 2022-09-08 DIAGNOSIS — D539 Nutritional anemia, unspecified: Secondary | ICD-10-CM | POA: Diagnosis present

## 2022-09-08 DIAGNOSIS — F411 Generalized anxiety disorder: Secondary | ICD-10-CM | POA: Diagnosis present

## 2022-09-08 DIAGNOSIS — Z79899 Other long term (current) drug therapy: Secondary | ICD-10-CM | POA: Diagnosis not present

## 2022-09-08 DIAGNOSIS — W010XXA Fall on same level from slipping, tripping and stumbling without subsequent striking against object, initial encounter: Secondary | ICD-10-CM | POA: Diagnosis present

## 2022-09-08 DIAGNOSIS — Z8616 Personal history of COVID-19: Secondary | ICD-10-CM

## 2022-09-08 DIAGNOSIS — S72141A Displaced intertrochanteric fracture of right femur, initial encounter for closed fracture: Secondary | ICD-10-CM | POA: Diagnosis present

## 2022-09-08 DIAGNOSIS — E785 Hyperlipidemia, unspecified: Secondary | ICD-10-CM | POA: Diagnosis present

## 2022-09-08 DIAGNOSIS — Y92008 Other place in unspecified non-institutional (private) residence as the place of occurrence of the external cause: Secondary | ICD-10-CM

## 2022-09-08 DIAGNOSIS — I10 Essential (primary) hypertension: Secondary | ICD-10-CM | POA: Diagnosis present

## 2022-09-08 DIAGNOSIS — Z7951 Long term (current) use of inhaled steroids: Secondary | ICD-10-CM

## 2022-09-08 DIAGNOSIS — Z6836 Body mass index (BMI) 36.0-36.9, adult: Secondary | ICD-10-CM | POA: Diagnosis not present

## 2022-09-08 DIAGNOSIS — R079 Chest pain, unspecified: Secondary | ICD-10-CM

## 2022-09-08 DIAGNOSIS — Z79890 Hormone replacement therapy (postmenopausal): Secondary | ICD-10-CM | POA: Diagnosis not present

## 2022-09-08 DIAGNOSIS — R600 Localized edema: Secondary | ICD-10-CM | POA: Diagnosis not present

## 2022-09-08 DIAGNOSIS — Z818 Family history of other mental and behavioral disorders: Secondary | ICD-10-CM

## 2022-09-08 DIAGNOSIS — S72009A Fracture of unspecified part of neck of unspecified femur, initial encounter for closed fracture: Secondary | ICD-10-CM

## 2022-09-08 DIAGNOSIS — E440 Moderate protein-calorie malnutrition: Secondary | ICD-10-CM | POA: Insufficient documentation

## 2022-09-08 DIAGNOSIS — J4540 Moderate persistent asthma, uncomplicated: Secondary | ICD-10-CM | POA: Diagnosis present

## 2022-09-08 DIAGNOSIS — Z885 Allergy status to narcotic agent status: Secondary | ICD-10-CM

## 2022-09-08 DIAGNOSIS — Z90710 Acquired absence of both cervix and uterus: Secondary | ICD-10-CM

## 2022-09-08 DIAGNOSIS — S72101A Unspecified trochanteric fracture of right femur, initial encounter for closed fracture: Secondary | ICD-10-CM | POA: Diagnosis not present

## 2022-09-08 DIAGNOSIS — Z66 Do not resuscitate: Secondary | ICD-10-CM | POA: Diagnosis present

## 2022-09-08 DIAGNOSIS — Z880 Allergy status to penicillin: Secondary | ICD-10-CM | POA: Diagnosis not present

## 2022-09-08 DIAGNOSIS — Z87891 Personal history of nicotine dependence: Secondary | ICD-10-CM | POA: Diagnosis not present

## 2022-09-08 DIAGNOSIS — Z882 Allergy status to sulfonamides status: Secondary | ICD-10-CM

## 2022-09-08 DIAGNOSIS — K219 Gastro-esophageal reflux disease without esophagitis: Secondary | ICD-10-CM | POA: Diagnosis present

## 2022-09-08 DIAGNOSIS — D72829 Elevated white blood cell count, unspecified: Secondary | ICD-10-CM

## 2022-09-08 DIAGNOSIS — J45909 Unspecified asthma, uncomplicated: Secondary | ICD-10-CM | POA: Diagnosis not present

## 2022-09-08 LAB — BASIC METABOLIC PANEL
Anion gap: 11 (ref 5–15)
BUN: 15 mg/dL (ref 8–23)
CO2: 23 mmol/L (ref 22–32)
Calcium: 9.2 mg/dL (ref 8.9–10.3)
Chloride: 103 mmol/L (ref 98–111)
Creatinine, Ser: 0.92 mg/dL (ref 0.44–1.00)
GFR, Estimated: >60 mL/min (ref >60)
Glucose, Bld: 174 mg/dL — ABNORMAL HIGH (ref 70–99)
Potassium: 3.6 mmol/L (ref 3.5–5.1)
Sodium: 137 mmol/L (ref 135–145)

## 2022-09-08 LAB — CBC
HCT: 40.9 % (ref 36.0–46.0)
Hemoglobin: 13.4 g/dL (ref 12.0–15.0)
MCH: 32.9 pg (ref 26.0–34.0)
MCHC: 32.8 g/dL (ref 30.0–36.0)
MCV: 100.5 fL — ABNORMAL HIGH (ref 80.0–100.0)
Platelets: 323 10*3/uL (ref 150–400)
RBC: 4.07 MIL/uL (ref 3.87–5.11)
RDW: 14.1 % (ref 11.5–15.5)
WBC: 25.3 10*3/uL — ABNORMAL HIGH (ref 4.0–10.5)
nRBC: 0 % (ref 0.0–0.2)

## 2022-09-08 LAB — TROPONIN I (HIGH SENSITIVITY): Troponin I (High Sensitivity): 5 ng/L (ref <18)

## 2022-09-08 MED ORDER — HYDROMORPHONE HCL 1 MG/ML IJ SOLN
0.5000 mg | Freq: Once | INTRAMUSCULAR | Status: AC
  Administered 2022-09-08: 0.5 mg via INTRAVENOUS
  Filled 2022-09-08: qty 1

## 2022-09-08 MED ORDER — HYDRALAZINE HCL 20 MG/ML IJ SOLN
5.0000 mg | Freq: Four times a day (QID) | INTRAMUSCULAR | Status: DC | PRN
  Administered 2022-09-12: 5 mg via INTRAVENOUS
  Filled 2022-09-08: qty 1

## 2022-09-08 MED ORDER — HYDROMORPHONE HCL 1 MG/ML IJ SOLN
0.5000 mg | INTRAMUSCULAR | Status: DC | PRN
  Administered 2022-09-09 (×2): 0.5 mg via INTRAVENOUS
  Filled 2022-09-08 (×2): qty 0.5

## 2022-09-08 MED ORDER — METHOCARBAMOL 500 MG PO TABS
500.0000 mg | ORAL_TABLET | Freq: Four times a day (QID) | ORAL | Status: DC | PRN
  Filled 2022-09-08: qty 1

## 2022-09-08 MED ORDER — TIZANIDINE HCL 4 MG PO TABS
4.0000 mg | ORAL_TABLET | Freq: Once | ORAL | Status: AC
Start: 2022-09-08 — End: 2022-09-08
  Administered 2022-09-08: 4 mg via ORAL
  Filled 2022-09-08: qty 1

## 2022-09-08 MED ORDER — METHOCARBAMOL 1000 MG/10ML IJ SOLN: 500.0000 mg | Freq: Four times a day (QID) | INTRAVENOUS | Status: DC | PRN

## 2022-09-08 MED ORDER — PRIMIDONE 50 MG PO TABS
50.0000 mg | ORAL_TABLET | Freq: Once | ORAL | Status: AC
Start: 2022-09-08 — End: 2022-09-08
  Administered 2022-09-08: 50 mg via ORAL
  Filled 2022-09-08: qty 1

## 2022-09-08 NOTE — H&P (View-Only) (Signed)
Chief Complaint: Right hip fracture  History: Elaine Byrd took a step backwards and tripped over a stool in her house.  She reports she fell onto her right hip.  She now has severe pain in the right hip down to the lateral leg.  Patient reports about a 1 week ago she was changing her clothes and ended up straining in her chest and got very big pop in the center of her chest.  She reports has been getting increasingly painful since that time.  She is not on any blood thinners.  She denies she struck her head with her fall.   Patient is awake and alert.  Normocephalic.  No respiratory distress at rest.  Obvious deformity at the right hip but lateral tenderness to palpation.  Patient has a history of of asthma and long COVID symptoms.  Past Medical History:  Diagnosis Date   Acid reflux    Asthma    Depression    Hypothyroidism    Neuropathy     Allergies  Allergen Reactions   Morphine And Related Other (See Comments)    hallucinations   Penicillins Other (See Comments)    Childhood reaction, cannot remember   Sulfa Antibiotics Other (See Comments) and Diarrhea    itchiness    No current facility-administered medications on file prior to encounter.   Current Outpatient Medications on File Prior to Encounter  Medication Sig Dispense Refill   acetaminophen (TYLENOL) 500 MG tablet Take by mouth.     albuterol (VENTOLIN HFA) 108 (90 Base) MCG/ACT inhaler Inhale 2 puffs into the lungs every 4 (four) hours as needed for wheezing or shortness of breath. 18 g 1   allopurinol (ZYLOPRIM) 300 MG tablet Take by mouth.     B Complex-Biotin-FA (B COMPLETE PO) Take 50 mg by mouth daily.     Bioflavonoid Products (ESTER-C) 500-550 MG TABS Take by mouth.     BREZTRI AEROSPHERE 160-9-4.8 MCG/ACT AERO INHALE 2 PUFFS TWICE DAILY 1 each 5   clonazePAM (KLONOPIN) 2 MG tablet Take by mouth.     co-enzyme Q-10 30 MG capsule Take 100 mg by mouth daily.     colchicine 0.6 MG tablet Take 0.6 mg by mouth  daily.     Cyanocobalamin (B-12) 2500 MCG TABS Take by mouth.     dupilumab (DUPIXENT) 300 MG/2ML prefilled syringe Inject 300 mg into the skin every 14 (fourteen) days.     EPINEPHrine 0.3 mg/0.3 mL IJ SOAJ injection Use as directed for severe allergic reactions 4 each 3   furosemide (LASIX) 20 MG tablet Take 20 mg by mouth every morning.     hydrOXYzine (ATARAX/VISTARIL) 25 MG tablet Take 1 tablet (25 mg total) by mouth 3 (three) times daily. 90 tablet 3   Lactobacillus Rhamnosus, GG, (RA PROBIOTIC DIGESTIVE CARE) CAPS Take by mouth.     levothyroxine (SYNTHROID, LEVOTHROID) 100 MCG tablet Take 100 mcg by mouth daily before breakfast.     loperamide (IMODIUM) 2 MG capsule Take by mouth.     Loratadine-Pseudoephedrine (CLARITIN-D 12 HOUR PO) Take by mouth.     Multiple Vitamins-Minerals (MULTIVITAMIN ADULT) TABS Take by mouth.     pantoprazole (PROTONIX) 40 MG tablet Take 1 tablet by mouth daily.     potassium chloride (KLOR-CON M) 10 MEQ tablet Take by mouth.     primidone (MYSOLINE) 50 MG tablet Take 50 mg by mouth 2 (two) times daily.     Probiotic Product (CULTURELLE PROBIOTICS PO) Take by  mouth 2 (two) times daily.     spironolactone (ALDACTONE) 25 MG tablet Take 25 mg by mouth daily.     tiZANidine (ZANAFLEX) 4 MG tablet Take 1 tablet by mouth 3 (three) times daily as needed.     tiZANidine (ZANAFLEX) 4 MG tablet Take by mouth. (Patient not taking: Reported on 07/14/2022)     Vitamin D-Vitamin K (VITAMIN K2-VITAMIN D3 PO) Take 5,000 mg by mouth daily.      Physical Exam: Vitals:   09/08/22 1906 09/08/22 1912  BP: (!) 143/93   Pulse: 97   Resp:  18  Temp: 97.8 F (36.6 C)   SpO2: 98%    Body mass index is 36.21 kg/m. She is a very pleasant 76 year old woman who is alert and oriented x3. She complains of anterior chest pain with palpation.  She denies any shortness of breath.  Lungs are clear to auscultation. Abdomen is soft and nontender.  No rebound tenderness. Patient has  significant pain and tenderness over the right groin with shortening of the lower extremity.  Mild knee pain but no significant swelling. EHL/tibialis anterior/gastrocnemius: 5/5 motor strength bilaterally.  Intact sensation light touch. Palpable 2+ dorsalis pedis/posterior tibialis pulses bilaterally.  Compartments are soft and nontender.  Image: DG Shoulder Right  Result Date: 09/08/2022 CLINICAL DATA:  Pain and limited movement after a fall. EXAM: RIGHT SHOULDER - 2+ VIEW COMPARISON:  None Available. FINDINGS: No acute fracture or dislocation demonstrated in the right shoulder. No focal bone lesion or bone destruction. Soft tissues are unremarkable. IMPRESSION: No acute bony abnormalities are demonstrated. Electronically Signed   By: Lucienne Capers M.D.   On: 09/08/2022 21:16   DG Knee Complete 4 Views Right  Result Date: 09/08/2022 CLINICAL DATA:  Pain after a fall. EXAM: RIGHT KNEE - COMPLETE 4+ VIEW COMPARISON:  None Available. FINDINGS: Evaluation is limited due to nonstandard positioning. As visualized, no acute displaced fractures are identified. No focal bone lesions. No significant effusion. IMPRESSION: Technically limited study due to nonstandard positioning. No acute displaced fractures are visualized. Electronically Signed   By: Lucienne Capers M.D.   On: 09/08/2022 21:14   DG Hip Unilat W or Wo Pelvis 2-3 Views Right  Result Date: 09/08/2022 CLINICAL DATA:  Pain and deformity after a fall. EXAM: DG HIP (WITH OR WITHOUT PELVIS) 2-3V RIGHT COMPARISON:  None Available. FINDINGS: Comminuted inter trochanteric fractures of the proximal right femur with medial displacement and impaction of the distal fracture fragments resulting in varus angulation. Degenerative changes in the right hip. No dislocation of the hip joint. Degenerative changes in the lower lumbar spine and left hip. SI joints and symphysis pubis are not displaced. IMPRESSION: Comminuted inter trochanteric fractures of the  proximal right femur with varus angulation. Electronically Signed   By: Lucienne Capers M.D.   On: 09/08/2022 21:14   CT Chest Wo Contrast  Result Date: 09/08/2022 CLINICAL DATA:  Chest trauma, blunt.  Fall EXAM: CT CHEST WITHOUT CONTRAST TECHNIQUE: Multidetector CT imaging of the chest was performed following the standard protocol without IV contrast. RADIATION DOSE REDUCTION: This exam was performed according to the departmental dose-optimization program which includes automated exposure control, adjustment of the mA and/or kV according to patient size and/or use of iterative reconstruction technique. COMPARISON:  None Available. FINDINGS: Cardiovascular: Mild coronary artery calcification. Global cardiac size within normal limits. No pericardial effusion. Central pulmonary arteries are enlarged in keeping with changes of pulmonary arterial hypertension. Moderate atherosclerotic calcification within the thoracic aorta. No aortic  aneurysm. Mediastinum/Nodes: No pathologic thoracic adenopathy. Esophagus unremarkable. Lungs/Pleura: Lungs are clear. No pleural effusion or pneumothorax. Upper Abdomen: No acute abnormality.  Status post cholecystectomy Musculoskeletal: Remote compression deformities of T11 and T12 are noted. Healed right fifth rib and scapular fracture formed ease are noted. Osseous structures are otherwise age-appropriate. No acute bone abnormality. No lytic or blastic bone lesion IMPRESSION: 1. No acute intrathoracic pathology identified. 2. Mild coronary artery calcification. 3. Enlarged central pulmonary arteries in keeping with changes of pulmonary arterial hypertension. Electronically Signed   By: Helyn Numbers M.D.   On: 09/08/2022 20:54    A/P: Elaine Byrd is a very pleasant 76 year old woman unfortunately had a mechanical fall earlier this evening and has been unable to ambulate.  She was brought to the emergency room by EMS and imaging studies demonstrated a comminuted intertrochanteric hip  fracture.  As result orthopedic consultation was requested.  Patient had been complaining of some pleuritic chest pain and so appropriate medical work-up is been initiated.  At this point time the patient will be admitted to the medical service and once cleared we will plan on definitive fracture fixation.  I will contact my partner Dr. Linna Caprice for definitive surgical management.  Recommend she remain n.p.o. until tomorrow when we can determine if she is stable for fracture fixation.

## 2022-09-08 NOTE — ED Provider Notes (Signed)
[place] DEPT Provider Note   CSN: [id number] Arrival date & time: 09/08/22  1851     History  Chief Complaint  Patient presents with   Ewell Poe is a 76 y.o. 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  Patient reports that she had an injury where she was trying to reach behind her to [facility] her bra and had been seen by urgent care today with normal plain film radiographs of the chest, there is suspicious of some possible chest wall injury and ordered a CT, patient had a mechanical, nonsyncopal fall from standing, complains of significant right hip and right knee pain, she does not take anything for pain at this time.  Denies any head injury, loss of consciousness, does not take blood thinners.  HPI     Home Medications Prior to Admission medications   Medication Sig Start Date End Date Taking? Authorizing Provider  acetaminophen (TYLENOL) 500 MG tablet Take by mouth.    [provider]  albuterol (VENTOLIN HFA) 108 (90 Base) MCG/ACT inhaler Inhale 2 puffs into the lungs every 4 (four) hours as needed for wheezing or shortness of breath. 12/25/20   Ambs, Kathrine Cords, FNP  allopurinol (ZYLOPRIM) 300 MG tablet Take by mouth. 01/26/22   [provider]  B Complex-Biotin-FA (B COMPLETE PO) Take 50 mg by mouth daily.    [provider]  Bioflavonoid Products (ESTER-C) 500-550 MG TABS Take by mouth.    [provider]  BREZTRI AEROSPHERE 160-9-4.8 MCG/ACT AERO INHALE 2 PUFFS TWICE DAILY 07/19/22   Ambs, Kathrine Cords, FNP  clonazePAM (KLONOPIN) 2 MG tablet Take by mouth. 02/12/22   [provider]  co-enzyme Q-10 30 MG capsule Take 100 mg by mouth daily.    [provider]  colchicine 0.6 MG tablet Take 0.6 mg by mouth daily. 07/12/22   [provider]  Cyanocobalamin (B-12) 2500 MCG TABS Take by mouth.    [provider]  dupilumab (DUPIXENT) 300 MG/2ML prefilled syringe Inject 300 mg into the skin every 14 (fourteen) days.    [provider]  EPINEPHrine 0.3 mg/0.3 mL IJ SOAJ injection Use as directed for severe allergic reactions 07/16/22   Ambs, Kathrine Cords, FNP  furosemide (LASIX) 20 MG tablet Take 20 mg by mouth every morning.    [provider]  hydrOXYzine (ATARAX/VISTARIL) 25 MG tablet Take 1 tablet (25 mg total) by mouth 3 (three) times daily. 04/25/19   Charlies Silvers, MD  Lactobacillus Rhamnosus, GG, (RA PROBIOTIC DIGESTIVE CARE) CAPS Take by mouth.    [provider]  levothyroxine (SYNTHROID, LEVOTHROID) 100 MCG tablet Take 100 mcg by mouth daily before breakfast.    [provider]  loperamide (IMODIUM) 2 MG capsule Take by mouth. 09/07/21   [provider]  Loratadine-Pseudoephedrine (CLARITIN-D 12 HOUR PO) Take by mouth.    [provider]  Multiple Vitamins-Minerals (MULTIVITAMIN ADULT) TABS Take by mouth.    [provider]  pantoprazole (PROTONIX) 40 MG tablet Take 1 tablet by mouth daily. 08/10/21   [provider]  potassium chloride (KLOR-CON M) 10 MEQ tablet Take by mouth. 01/17/22   [provider]  primidone (MYSOLINE) 50 MG tablet Take 50 mg by mouth 2 (two) times daily. 06/08/22  [provider]  Probiotic Product (CULTURELLE PROBIOTICS PO) Take by mouth 2 (two) times daily.    [provider]  spironolactone (ALDACTONE) 25 MG tablet Take 25 mg by mouth daily. 07/05/22   [provider]  tiZANidine (ZANAFLEX) 4 MG tablet Take 1 tablet by mouth 3 (three) times daily as needed. 06/22/21   [provider]  tiZANidine (ZANAFLEX) 4 MG tablet Take by mouth. Patient not taking: Reported on 07/14/2022 09/01/21    [provider]  Vitamin D-Vitamin K (VITAMIN K2-VITAMIN D3 PO) Take 5,000 mg by mouth daily.    [provider]      Allergies    Morphine and related, Penicillins, and Sulfa antibiotics    Review of Systems   Review of Systems  Musculoskeletal:  Positive for arthralgias, back pain, gait problem and myalgias.  All other systems reviewed and are negative.   Physical Exam Updated Vital Signs BP (!) 167/67   Pulse 96   Temp 97.9 F (36.6 C) (Oral)   Resp 16   Ht 5\' 2"  (1.575 m)   Wt 89.8 kg   SpO2 94%   BMI 36.21 kg/m  Physical Exam Vitals and nursing note reviewed.  Constitutional:      General: She is not in acute distress.    Appearance: Normal appearance. She is obese.  HENT:     Head: Normocephalic and atraumatic.  Eyes:     General:        Right eye: No discharge.        Left eye: No discharge.  Cardiovascular:     Rate and Rhythm: Normal rate and regular rhythm.     Heart sounds: No murmur heard.    No friction rub. No gallop.  Pulmonary:     Effort: Pulmonary effort is normal.     Breath sounds: Normal breath sounds.  Abdominal:     General: Bowel sounds are normal.     Palpations: Abdomen is soft.  Musculoskeletal:     Comments: Tenderness to palpation right hip right knee, patient refuses to try strength of right hip and knee, but can flex ankle without difficulty.  No obvious step-off or deformity or leg length discrepancy noted.  She also has some tenderness to palpation central chest wall without step-off or deformity.  No significant tenderness left upper extremity, some tenderness right humeral head.  Skin:    General: Skin is warm and dry.     Capillary Refill: Capillary refill takes less than 2 seconds.  Neurological:     Mental Status: She is alert and oriented to person, place, and time.  Psychiatric:        Mood and Affect: Mood normal.        Behavior: Behavior normal.     ED Results / Procedures / Treatments    Labs (all labs ordered are listed, but only abnormal results are displayed) Labs Reviewed  CBC - Abnormal; Notable for the following components:      Result Value   WBC 25.3 (*)    MCV 100.5 (*)    All other components within normal limits  BASIC METABOLIC PANEL - Abnormal; Notable for the following components:   Glucose, Bld 174 (*)    All other components within normal limits  TROPONIN I (HIGH SENSITIVITY)  TROPONIN I (HIGH SENSITIVITY)    EKG None  Radiology DG Shoulder Right  Result Date: 09/08/2022 CLINICAL DATA:  Pain and limited movement after a fall. EXAM: RIGHT SHOULDER -  2+ VIEW COMPARISON:  None Available. FINDINGS: No acute fracture or dislocation demonstrated in the right shoulder. No focal bone lesion or bone destruction. Soft tissues are unremarkable. IMPRESSION: No acute bony abnormalities are demonstrated. Electronically Signed   By: Burman Nieves M.D.   On: 09/08/2022 21:16   DG Knee Complete 4 Views Right  Result Date: 09/08/2022 CLINICAL DATA:  Pain after a fall. EXAM: RIGHT KNEE - COMPLETE 4+ VIEW COMPARISON:  None Available. FINDINGS: Evaluation is limited due to nonstandard positioning. As visualized, no acute displaced fractures are identified. No focal bone lesions. No significant effusion. IMPRESSION: Technically limited study due to nonstandard positioning. No acute displaced fractures are visualized. Electronically Signed   By: Burman Nieves M.D.   On: 09/08/2022 21:14   DG Hip Unilat W or Wo Pelvis 2-3 Views Right  Result Date: 09/08/2022 CLINICAL DATA:  Pain and deformity after a fall. EXAM: DG HIP (WITH OR WITHOUT PELVIS) 2-3V RIGHT COMPARISON:  None Available. FINDINGS: Comminuted inter trochanteric fractures of the proximal right femur with medial displacement and impaction of the distal fracture fragments resulting in varus angulation. Degenerative changes in the right hip. No dislocation of the hip joint. Degenerative changes in the lower  lumbar spine and left hip. SI joints and symphysis pubis are not displaced. IMPRESSION: Comminuted inter trochanteric fractures of the proximal right femur with varus angulation. Electronically Signed   By: Burman Nieves M.D.   On: 09/08/2022 21:14   CT Chest Wo Contrast  Result Date: 09/08/2022 CLINICAL DATA:  Chest trauma, blunt.  Fall EXAM: CT CHEST WITHOUT CONTRAST TECHNIQUE: Multidetector CT imaging of the chest was performed following the standard protocol without IV contrast. RADIATION DOSE REDUCTION: This exam was performed according to the departmental dose-optimization program which includes automated exposure control, adjustment of the mA and/or kV according to patient size and/or use of iterative reconstruction technique. COMPARISON:  None Available. FINDINGS: Cardiovascular: Mild coronary artery calcification. Global cardiac size within normal limits. No pericardial effusion. Central pulmonary arteries are enlarged in keeping with changes of pulmonary arterial hypertension. Moderate atherosclerotic calcification within the thoracic aorta. No aortic aneurysm. Mediastinum/Nodes: No pathologic thoracic adenopathy. Esophagus unremarkable. Lungs/Pleura: Lungs are clear. No pleural effusion or pneumothorax. Upper Abdomen: No acute abnormality.  Status post cholecystectomy Musculoskeletal: Remote compression deformities of T11 and T12 are noted. Healed right fifth rib and scapular fracture formed ease are noted. Osseous structures are otherwise age-appropriate. No acute bone abnormality. No lytic or blastic bone lesion IMPRESSION: 1. No acute intrathoracic pathology identified. 2. Mild coronary artery calcification. 3. Enlarged central pulmonary arteries in keeping with changes of pulmonary arterial hypertension. Electronically Signed   By: Helyn Numbers M.D.   On: 09/08/2022 20:54    Procedures Procedures    Medications Ordered in ED Medications  tiZANidine (ZANAFLEX) tablet 4 mg (4 mg Oral  Given 09/08/22 2127)  primidone (MYSOLINE) tablet 50 mg (50 mg Oral Given 09/08/22 2147)  HYDROmorphone (DILAUDID) injection 0.5 mg (0.5 mg Intravenous Given 09/08/22 2030)  HYDROmorphone (DILAUDID) injection 0.5 mg (0.5 mg Intravenous Given 09/08/22 2322)    ED Course/ Medical Decision Making/ A&P Clinical Course as of 09/08/22 2337  Wed Sep 08, 2022  2150 [place] hospitalist -- secure chat after speaking to ortho [CP]    Clinical Course User Index [CP] Olene Floss, PA-C                           Medical Decision Making  Amount and/or Complexity of Data Reviewed Labs: ordered. Radiology: ordered.  Risk Prescription drug management. Decision regarding hospitalization.   This patient is a 76 y.o. female who presents to the ED for concern of right hip pain secondary to mechanical fall, patient also having some central chest pain after trying to put on her bra 10 days ago, seen evaluated in urgent care, and they were suspicious for pectoralis muscle injury this involves an extensive number of treatment options, and is a complaint that carries with it a high risk of complications and morbidity. The emergent differential diagnosis prior to evaluation includes, but is not limited to, concern for right hip fracture versus dislocation, other right-sided injury including possible shoulder injury, knee injury, muscular pain, considered atypical ACS presentation versus musculoskeletal injury of the chest wall, versus pneumonia, otherwise, PE, bronchitis versus other..   This is not an exhaustive differential.   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 Pertinent results include: Reviewed lab work, imaging from previous emergency department visits, as well as  neurology visits  Physical Exam: Physical exam performed. The pertinent findings include: Patient with significant tenderness along the right hip, and inability to lift right leg at hip or flex at knee.  She has intact strength to dorsiflexion, plantarflexion at the ankle.  She is neurovascularly intact throughout.  She has some tenderness along the central sternum, right shoulder, with no step-off or deformity of the upper extremity or chest wall  Lab Tests: I ordered, and personally interpreted labs.  The pertinent results include: CBC notable for significant leukocytosis, white blood cells 25.3.  In context of any suspicion for infection in my evaluation of this patient I am suspicious of significant pain as etiology for her leukocytosis.  BMP notable for mild hyperglycemia, glucose 174.  Troponin x1 is 5.    Imaging Studies: I ordered imaging studies including plain film radiograph of the right shoulder, right knee, right hip CT chest without contrast. I independently visualized and interpreted imaging which showed no evidence of shoulder fracture, knee fracture or dislocation, CT chest shows some pulmonary hypertension and coronary calcification without any evidence of skeletal injury, plain film of the right hip shows comminuted intertrochanteric fracture of the proximal femur. I agree with the radiologist interpretation.   Cardiac Monitoring:  The patient was maintained on a cardiac monitor.  My attending physician Dr. Donnald Garre viewed and interpreted the cardiac monitored which showed an underlying rhythm of: NSR, PACs, no evidence of ischemia. I agree with this interpretation.   Medications: I ordered medication including home primidone, tizanidine for her tremors, Dilaudid for pain. Reevaluation of the patient after these medicines showed that the patient improved. I have reviewed the patients home medicines and have made adjustments as needed.  Consultations Obtained: I requested  consultation with the orthopedic surgeon, spoke with Dr. Shon Baton who will consult on this patient, request that she be made n.p.o., spoke with Dr. Loney Loh,  and discussed lab and imaging findings as well as pertinent plan - they recommend: Will require troponin, EKG to evaluate for any possible atypical ACS, and then admit for surgical evaluation of her right hip.   Disposition: After consideration of the diagnostic results and the patients response to treatment, I feel that patient would benefit from admission for right hip fracture as discussed above.   I discussed this case with my  attending physician Dr. Donnald GarrePfeiffer who cosigned this note including patient's presenting symptoms, physical exam, and planned diagnostics and interventions. Attending physician stated agreement with plan or made changes to plan which were implemented.    Final Clinical Impression(s) / ED Diagnoses Final diagnoses:  Closed fracture of right hip, initial encounter [facility](HCC)    Rx / DC Orders ED Discharge Orders     None         [place]ali[place]peri, Khris Jansson H, PA-C 09/08/22 2337    Arby BarrettePfeiffer, Marcy, MD 09/09/22 1538

## 2022-09-08 NOTE — ED Provider Notes (Signed)
I provided a substantive portion of the care of this patient.  I personally performed the entirety of the history for this encounter.     She took a step backwards and tripped over a stool in her house.  She reports she fell onto her right hip.  She now has severe pain in the right hip down to the lateral leg.  Patient reports about a 1 week ago she was changing her clothes and ended up straining in her chest and got very big pop in the center of her chest.  She reports has been getting increasingly painful since that time.  She is not on any blood thinners.  She denies she struck her head with her fall.  Patient is awake and alert.  Normocephalic.  No respiratory distress at rest.  Obvious deformity at the right hip but lateral tenderness to palpation.  I agree with plan and management   Charlesetta Shanks, MD 09/08/22 2011

## 2022-09-08 NOTE — ED Notes (Signed)
Pt comes in after fall at home. Pt was not using walker. Pt having tremors pt reports that they are from her long COVID and that they are worse than normal right now because she has not taken her medications yet tonite. Pt reports 10/10 pain.

## 2022-09-08 NOTE — Consult Note (Signed)
   Chief Complaint: Right hip fracture  History: Elaine Byrd took a step backwards and tripped over a stool in her house.  She reports she fell onto her right hip.  She now has severe pain in the right hip down to the lateral leg.  Patient reports about a 1 week ago she was changing her clothes and ended up straining in her chest and got very big pop in the center of her chest.  She reports has been getting increasingly painful since that time.  She is not on any blood thinners.  She denies she struck her head with her fall.   Patient is awake and alert.  Normocephalic.  No respiratory distress at rest.  Obvious deformity at the right hip but lateral tenderness to palpation.  Patient has a history of of asthma and long COVID symptoms.  Past Medical History:  Diagnosis Date   Acid reflux    Asthma    Depression    Hypothyroidism    Neuropathy     Allergies  Allergen Reactions   Morphine And Related Other (See Comments)    hallucinations   Penicillins Other (See Comments)    Childhood reaction, cannot remember   Sulfa Antibiotics Other (See Comments) and Diarrhea    itchiness    No current facility-administered medications on file prior to encounter.   Current Outpatient Medications on File Prior to Encounter  Medication Sig Dispense Refill   acetaminophen (TYLENOL) 500 MG tablet Take by mouth.     albuterol (VENTOLIN HFA) 108 (90 Base) MCG/ACT inhaler Inhale 2 puffs into the lungs every 4 (four) hours as needed for wheezing or shortness of breath. 18 g 1   allopurinol (ZYLOPRIM) 300 MG tablet Take by mouth.     B Complex-Biotin-FA (B COMPLETE PO) Take 50 mg by mouth daily.     Bioflavonoid Products (ESTER-C) 500-550 MG TABS Take by mouth.     BREZTRI AEROSPHERE 160-9-4.8 MCG/ACT AERO INHALE 2 PUFFS TWICE DAILY 1 each 5   clonazePAM (KLONOPIN) 2 MG tablet Take by mouth.     co-enzyme Q-10 30 MG capsule Take 100 mg by mouth daily.     colchicine 0.6 MG tablet Take 0.6 mg by mouth  daily.     Cyanocobalamin (B-12) 2500 MCG TABS Take by mouth.     dupilumab (DUPIXENT) 300 MG/2ML prefilled syringe Inject 300 mg into the skin every 14 (fourteen) days.     EPINEPHrine 0.3 mg/0.3 mL IJ SOAJ injection Use as directed for severe allergic reactions 4 each 3   furosemide (LASIX) 20 MG tablet Take 20 mg by mouth every morning.     hydrOXYzine (ATARAX/VISTARIL) 25 MG tablet Take 1 tablet (25 mg total) by mouth 3 (three) times daily. 90 tablet 3   Lactobacillus Rhamnosus, GG, (RA PROBIOTIC DIGESTIVE CARE) CAPS Take by mouth.     levothyroxine (SYNTHROID, LEVOTHROID) 100 MCG tablet Take 100 mcg by mouth daily before breakfast.     loperamide (IMODIUM) 2 MG capsule Take by mouth.     Loratadine-Pseudoephedrine (CLARITIN-D 12 HOUR PO) Take by mouth.     Multiple Vitamins-Minerals (MULTIVITAMIN ADULT) TABS Take by mouth.     pantoprazole (PROTONIX) 40 MG tablet Take 1 tablet by mouth daily.     potassium chloride (KLOR-CON M) 10 MEQ tablet Take by mouth.     primidone (MYSOLINE) 50 MG tablet Take 50 mg by mouth 2 (two) times daily.     Probiotic Product (CULTURELLE PROBIOTICS PO) Take by   mouth 2 (two) times daily.     spironolactone (ALDACTONE) 25 MG tablet Take 25 mg by mouth daily.     tiZANidine (ZANAFLEX) 4 MG tablet Take 1 tablet by mouth 3 (three) times daily as needed.     tiZANidine (ZANAFLEX) 4 MG tablet Take by mouth. (Patient not taking: Reported on 07/14/2022)     Vitamin D-Vitamin K (VITAMIN K2-VITAMIN D3 PO) Take 5,000 mg by mouth daily.      Physical Exam: Vitals:   09/08/22 1906 09/08/22 1912  BP: (!) 143/93   Pulse: 97   Resp:  18  Temp: 97.8 F (36.6 C)   SpO2: 98%    Body mass index is 36.21 kg/m. She is a very pleasant 76 year old woman who is alert and oriented x3. She complains of anterior chest pain with palpation.  She denies any shortness of breath.  Lungs are clear to auscultation. Abdomen is soft and nontender.  No rebound tenderness. Patient has  significant pain and tenderness over the right groin with shortening of the lower extremity.  Mild knee pain but no significant swelling. EHL/tibialis anterior/gastrocnemius: 5/5 motor strength bilaterally.  Intact sensation light touch. Palpable 2+ dorsalis pedis/posterior tibialis pulses bilaterally.  Compartments are soft and nontender.  Image: DG Shoulder Right  Result Date: 09/08/2022 CLINICAL DATA:  Pain and limited movement after a fall. EXAM: RIGHT SHOULDER - 2+ VIEW COMPARISON:  None Available. FINDINGS: No acute fracture or dislocation demonstrated in the right shoulder. No focal bone lesion or bone destruction. Soft tissues are unremarkable. IMPRESSION: No acute bony abnormalities are demonstrated. Electronically Signed   By: Lucienne Capers M.D.   On: 09/08/2022 21:16   DG Knee Complete 4 Views Right  Result Date: 09/08/2022 CLINICAL DATA:  Pain after a fall. EXAM: RIGHT KNEE - COMPLETE 4+ VIEW COMPARISON:  None Available. FINDINGS: Evaluation is limited due to nonstandard positioning. As visualized, no acute displaced fractures are identified. No focal bone lesions. No significant effusion. IMPRESSION: Technically limited study due to nonstandard positioning. No acute displaced fractures are visualized. Electronically Signed   By: Lucienne Capers M.D.   On: 09/08/2022 21:14   DG Hip Unilat W or Wo Pelvis 2-3 Views Right  Result Date: 09/08/2022 CLINICAL DATA:  Pain and deformity after a fall. EXAM: DG HIP (WITH OR WITHOUT PELVIS) 2-3V RIGHT COMPARISON:  None Available. FINDINGS: Comminuted inter trochanteric fractures of the proximal right femur with medial displacement and impaction of the distal fracture fragments resulting in varus angulation. Degenerative changes in the right hip. No dislocation of the hip joint. Degenerative changes in the lower lumbar spine and left hip. SI joints and symphysis pubis are not displaced. IMPRESSION: Comminuted inter trochanteric fractures of the  proximal right femur with varus angulation. Electronically Signed   By: Lucienne Capers M.D.   On: 09/08/2022 21:14   CT Chest Wo Contrast  Result Date: 09/08/2022 CLINICAL DATA:  Chest trauma, blunt.  Fall EXAM: CT CHEST WITHOUT CONTRAST TECHNIQUE: Multidetector CT imaging of the chest was performed following the standard protocol without IV contrast. RADIATION DOSE REDUCTION: This exam was performed according to the departmental dose-optimization program which includes automated exposure control, adjustment of the mA and/or kV according to patient size and/or use of iterative reconstruction technique. COMPARISON:  None Available. FINDINGS: Cardiovascular: Mild coronary artery calcification. Global cardiac size within normal limits. No pericardial effusion. Central pulmonary arteries are enlarged in keeping with changes of pulmonary arterial hypertension. Moderate atherosclerotic calcification within the thoracic aorta. No aortic  aneurysm. Mediastinum/Nodes: No pathologic thoracic adenopathy. Esophagus unremarkable. Lungs/Pleura: Lungs are clear. No pleural effusion or pneumothorax. Upper Abdomen: No acute abnormality.  Status post cholecystectomy Musculoskeletal: Remote compression deformities of T11 and T12 are noted. Healed right fifth rib and scapular fracture formed ease are noted. Osseous structures are otherwise age-appropriate. No acute bone abnormality. No lytic or blastic bone lesion IMPRESSION: 1. No acute intrathoracic pathology identified. 2. Mild coronary artery calcification. 3. Enlarged central pulmonary arteries in keeping with changes of pulmonary arterial hypertension. Electronically Signed   By: Ashesh  Parikh M.D.   On: 09/08/2022 20:54    A/P: Elaine Byrd is a very pleasant 76-year-old woman unfortunately had a mechanical fall earlier this evening and has been unable to ambulate.  She was brought to the emergency room by EMS and imaging studies demonstrated a comminuted intertrochanteric hip  fracture.  As result orthopedic consultation was requested.  Patient had been complaining of some pleuritic chest pain and so appropriate medical work-up is been initiated.  At this point time the patient will be admitted to the medical service and once cleared we will plan on definitive fracture fixation.  I will contact my partner Dr. Swinteck for definitive surgical management.  Recommend she remain n.p.o. until tomorrow when we can determine if she is stable for fracture fixation.  

## 2022-09-08 NOTE — ED Triage Notes (Signed)
EMS reports from home, called out for slip and fall, was not using walker. No head strike, LOC or blood thinners. Pt c/o right leg pain. No obvious injuries.  BP 148/82 HR 90 RR 18 Sp02 98 RA

## 2022-09-08 NOTE — H&P (Signed)
History and Physical    Elaine Leversrin F Defina MWU:[id number] DOB: 10/20/1946 DOA: 09/08/2022  PCP: Laqueta DueFurr, Sara M., MD  Patient coming from: Home  Chief Complaint: Right hip pain  HPI: Elaine Byrd is a 76 y.o. female with medical history significant of moderate persistent asthma, allergic rhinitis, atopic dermatitis, stinking insect allergy, GERD, depression, hypothyroidism, hypertension, neuropathy, gout presented to the ED complaining of right hip pain after a mechanical fall at home.  Patient also complained of chest pain.  Vital signs on arrival to the ED: Temperature 97.8 F, pulse 97, respiratory rate 18, blood pressure 143/93, SPO2 98% on room air.  Labs showing WBC 25.3, glucose 174, troponin negative.  X-ray of right hip/pelvis showing comminuted intertrochanteric fractures of the proximal right femur with varus angulation.  CT chest without contrast and x-rays of right knee/right shoulder negative for acute finding. Patient was given Dilaudid, primidone, and tizanidine.  Orthopedics consulted. [facility] called to admit.   Patient reports mechanical fall at home this afternoon.  She went to pick up her phone and as she was turning back, accidentally tripped on a small stool and fell onto her right hip.  She experienced sudden severe pain in her right hip at that time and was not able to get up from the floor.  Denies head injury or loss of consciousness.  She also reports ongoing substernal chest pain which started 8 days ago as she was trying to wear her bra.  Patient states she had her hands behind her back and was trying to hook her bra when she heard a loud pop from the middle of her chest.  She has tried taking Tylenol at home but the pain has persisted.  She describes it as sharp substernal chest pain.  Reports chronic dyspnea secondary to asthma, no recent change.  Patient also reports chronic bilateral lower extremity edema and chronic diarrhea since after she had COVID 2 years ago.  She takes Imodium  at home as needed and not having active diarrhea at this time.  Denies fevers, nausea, vomiting, abdominal pain, or any urinary symptoms.  Review of Systems:  Review of Systems  All other systems reviewed and are negative.   Past Medical History:  Diagnosis Date   Acid reflux    Asthma    Depression    Hypothyroidism    Neuropathy     Past Surgical History:  Procedure Laterality Date   ABDOMINAL HYSTERECTOMY     CATARACT EXTRACTION BILATERAL W/ ANTERIOR VITRECTOMY Bilateral 12/15 and 01/16   CERVICAL LAMINECTOMY     CHOLECYSTECTOMY     LUMBAR LAMINECTOMY     NASAL SINUS SURGERY     NECK SURGERY     ROTATOR CUFF REPAIR Right 12/18/2019     reports that she quit smoking about 41 years ago. Her smoking use included cigarettes. She has a 10.00 pack-year smoking history. She has never used smokeless tobacco. She reports current alcohol use of about 1.0 standard drink of alcohol per week. She reports that she does not use drugs.  Allergies  Allergen Reactions   Morphine And Related Other (See Comments)    hallucinations   Penicillins Other (See Comments)    Childhood reaction, cannot remember   Sulfa Antibiotics Other (See Comments) and Diarrhea    itchiness    Family History  Problem Relation Age of Onset   Depression Sister    Suicidality Sister    Bipolar disorder Sister    Depression Mother  Allergic rhinitis Mother    Angioedema Neg Hx    Asthma Neg Hx    Eczema Neg Hx    Immunodeficiency Neg Hx    Urticaria Neg Hx     Prior to Admission medications   Medication Sig Start Date End Date Taking? Authorizing Provider  acetaminophen (TYLENOL) 500 MG tablet Take by mouth.    [provider]  albuterol (VENTOLIN HFA) 108 (90 Base) MCG/ACT inhaler Inhale 2 puffs into the lungs every 4 (four) hours as needed for wheezing or shortness of breath. 12/25/20   Ambs, Kathrine Cords, FNP  allopurinol (ZYLOPRIM) 300 MG tablet Take by mouth. 01/26/22   [provider]  B Complex-Biotin-FA (B COMPLETE PO) Take 50 mg by mouth daily.    [provider]  Bioflavonoid Products (ESTER-C) 500-550 MG TABS Take by mouth.    [provider]  BREZTRI AEROSPHERE 160-9-4.8 MCG/ACT AERO INHALE 2 PUFFS TWICE DAILY 07/19/22   Ambs, Kathrine Cords, FNP  clonazePAM (KLONOPIN) 2 MG tablet Take by mouth. 02/12/22   [provider]  co-enzyme Q-10 30 MG capsule Take 100 mg by mouth daily.    [provider]  colchicine 0.6 MG tablet Take 0.6 mg by mouth daily. 07/12/22   [provider]  Cyanocobalamin (B-12) 2500 MCG TABS Take by mouth.    [provider]  dupilumab (DUPIXENT) 300 MG/2ML prefilled syringe Inject 300 mg into the skin every 14 (fourteen) days.    [provider]  EPINEPHrine 0.3 mg/0.3 mL IJ SOAJ injection Use as directed for severe allergic reactions 07/16/22   Ambs, Kathrine Cords, FNP  furosemide (LASIX) 20 MG tablet Take 20 mg by mouth every morning.    [provider]  hydrOXYzine (ATARAX/VISTARIL) 25 MG tablet Take 1 tablet (25 mg total) by mouth 3 (three) times daily. 04/25/19   Charlies Silvers, MD  Lactobacillus Rhamnosus, GG, (RA PROBIOTIC DIGESTIVE CARE) CAPS Take by mouth.    [provider]  levothyroxine (SYNTHROID, LEVOTHROID) 100 MCG tablet Take 100 mcg by mouth daily before breakfast.    [provider]  loperamide (IMODIUM) 2 MG capsule Take by mouth. 09/07/21   [provider]  Loratadine-Pseudoephedrine (CLARITIN-D 12 HOUR PO) Take by mouth.    [provider]  Multiple Vitamins-Minerals (MULTIVITAMIN ADULT) TABS Take by mouth.    [provider]  pantoprazole (PROTONIX) 40 MG tablet Take 1 tablet by mouth daily. 08/10/21   [provider]  potassium chloride (KLOR-CON M) 10 MEQ tablet Take by mouth. 01/17/22   [provider]  primidone (MYSOLINE) 50 MG tablet Take 50 mg by mouth 2 (two) times daily. 06/08/22   [provider]  Probiotic Product (CULTURELLE PROBIOTICS PO) Take by mouth 2 (two) times daily.    [provider]  spironolactone (ALDACTONE) 25 MG tablet Take 25 mg by mouth daily. 07/05/22   [provider]  tiZANidine (ZANAFLEX) 4 MG tablet Take 1 tablet by mouth 3 (three) times daily as needed. 06/22/21   [provider]  tiZANidine (ZANAFLEX) 4 MG tablet Take by mouth. Patient not taking: Reported on 07/14/2022 09/01/21   [provider]  Vitamin D-Vitamin K (VITAMIN K2-VITAMIN D3 PO) Take 5,000 mg by mouth daily.    [provider]    Physical Exam: Vitals:   09/08/22 1906 09/08/22 1912  BP: (!) 143/93   Pulse: 97   Resp:  18  Temp: 97.8 F (36.6 C)   TempSrc: Oral  SpO2: 98%   Weight:  89.8 kg  Height:  5\' 2"  (1.575 m)    Physical Exam Vitals reviewed.  Constitutional:      General: She is not in acute distress. HENT:     Head: Normocephalic and atraumatic.  Eyes:     Extraocular Movements: Extraocular movements intact.  Cardiovascular:     Rate and Rhythm: Normal rate and regular rhythm.     Pulses: Normal pulses.  Pulmonary:     Effort: Pulmonary effort is normal. No respiratory distress.     Breath sounds: Normal breath sounds. No wheezing or rales.  Abdominal:     General: Bowel sounds are normal. There is no distension.     Palpations: Abdomen is soft.     Tenderness: There is no abdominal tenderness.  Musculoskeletal:     Cervical back: Normal range of motion.     Right lower leg: Edema present.     Left lower leg: Edema present.     Comments: Chest pain reproducible upon palpation of the sternum  Skin:    General: Skin is warm and dry.  Neurological:     General: No focal deficit present.     Mental Status: She is alert and oriented to person, place, and time.     Labs on Admission: I have personally reviewed following labs and imaging studies  CBC: Recent Labs  Lab 09/08/22 2030  WBC 25.3*  HGB 13.4  HCT 40.9   MCV 100.5*  PLT 323   Basic Metabolic Panel: Recent Labs  Lab 09/08/22 2030  NA 137  K 3.6  CL 103  CO2 23  GLUCOSE 174*  BUN 15  CREATININE 0.92  CALCIUM 9.2   GFR: Estimated Creatinine Clearance: 55.9 mL/min (by C-G formula based on SCr of 0.92 mg/dL). Liver Function Tests: No results for input(s): "AST", "ALT", "ALKPHOS", "BILITOT", "PROT", "ALBUMIN" in the last 168 hours. No results for input(s): "LIPASE", "AMYLASE" in the last 168 hours. No results for input(s): "AMMONIA" in the last 168 hours. Coagulation Profile: No results for input(s): "INR", "PROTIME" in the last 168 hours. Cardiac Enzymes: No results for input(s): "CKTOTAL", "CKMB", "CKMBINDEX", "TROPONINI" in the last 168 hours. BNP (last 3 results) No results for input(s): "PROBNP" in the last 8760 hours. HbA1C: No results for input(s): "HGBA1C" in the last 72 hours. CBG: No results for input(s): "GLUCAP" in the last 168 hours. Lipid Profile: No results for input(s): "CHOL", "HDL", "LDLCALC", "TRIG", "CHOLHDL", "LDLDIRECT" in the last 72 hours. Thyroid Function Tests: No results for input(s): "TSH", "T4TOTAL", "FREET4", "T3FREE", "THYROIDAB" in the last 72 hours. Anemia Panel: No results for input(s): "VITAMINB12", "FOLATE", "FERRITIN", "TIBC", "IRON", "RETICCTPCT" in the last 72 hours. Urine analysis:    Component Value Date/Time   COLORURINE AMBER (A) 02/01/2013 2002   APPEARANCEUR CLEAR 02/01/2013 2002   LABSPEC >1.046 (H) 02/01/2013 2002   PHURINE 5.0 02/01/2013 2002   GLUCOSEU NEGATIVE 02/01/2013 2002   HGBUR NEGATIVE 02/01/2013 2002   BILIRUBINUR SMALL (A) 02/01/2013 2002   KETONESUR NEGATIVE 02/01/2013 2002   PROTEINUR NEGATIVE 02/01/2013 2002   UROBILINOGEN 0.2 02/01/2013 2002   NITRITE NEGATIVE 02/01/2013 2002   LEUKOCYTESUR SMALL (A) 02/01/2013 2002    Radiological Exams on Admission: DG Shoulder Right  Result Date: 09/08/2022 CLINICAL DATA:  Pain and limited movement after a fall.  EXAM: RIGHT SHOULDER - 2+ VIEW COMPARISON:  None Available. FINDINGS: No acute fracture or dislocation demonstrated in the right shoulder. No focal bone lesion or bone  destruction. Soft tissues are unremarkable. IMPRESSION: No acute bony abnormalities are demonstrated. Electronically Signed   By: Burman Nieves M.D.   On: 09/08/2022 21:16   DG Knee Complete 4 Views Right  Result Date: 09/08/2022 CLINICAL DATA:  Pain after a fall. EXAM: RIGHT KNEE - COMPLETE 4+ VIEW COMPARISON:  None Available. FINDINGS: Evaluation is limited due to nonstandard positioning. As visualized, no acute displaced fractures are identified. No focal bone lesions. No significant effusion. IMPRESSION: Technically limited study due to nonstandard positioning. No acute displaced fractures are visualized. Electronically Signed   By: Burman Nieves M.D.   On: 09/08/2022 21:14   DG Hip Unilat W or Wo Pelvis 2-3 Views Right  Result Date: 09/08/2022 CLINICAL DATA:  Pain and deformity after a fall. EXAM: DG HIP (WITH OR WITHOUT PELVIS) 2-3V RIGHT COMPARISON:  None Available. FINDINGS: Comminuted inter trochanteric fractures of the proximal right femur with medial displacement and impaction of the distal fracture fragments resulting in varus angulation. Degenerative changes in the right hip. No dislocation of the hip joint. Degenerative changes in the lower lumbar spine and left hip. SI joints and symphysis pubis are not displaced. IMPRESSION: Comminuted inter trochanteric fractures of the proximal right femur with varus angulation. Electronically Signed   By: Burman Nieves M.D.   On: 09/08/2022 21:14   CT Chest Wo Contrast  Result Date: 09/08/2022 CLINICAL DATA:  Chest trauma, blunt.  Fall EXAM: CT CHEST WITHOUT CONTRAST TECHNIQUE: Multidetector CT imaging of the chest was performed following the standard protocol without IV contrast. RADIATION DOSE REDUCTION: This exam was performed according to the departmental  dose-optimization program which includes automated exposure control, adjustment of the mA and/or kV according to patient size and/or use of iterative reconstruction technique. COMPARISON:  None Available. FINDINGS: Cardiovascular: Mild coronary artery calcification. Global cardiac size within normal limits. No pericardial effusion. Central pulmonary arteries are enlarged in keeping with changes of pulmonary arterial hypertension. Moderate atherosclerotic calcification within the thoracic aorta. No aortic aneurysm. Mediastinum/Nodes: No pathologic thoracic adenopathy. Esophagus unremarkable. Lungs/Pleura: Lungs are clear. No pleural effusion or pneumothorax. Upper Abdomen: No acute abnormality.  Status post cholecystectomy Musculoskeletal: Remote compression deformities of T11 and T12 are noted. Healed right fifth rib and scapular fracture formed ease are noted. Osseous structures are otherwise age-appropriate. No acute bone abnormality. No lytic or blastic bone lesion IMPRESSION: 1. No acute intrathoracic pathology identified. 2. Mild coronary artery calcification. 3. Enlarged central pulmonary arteries in keeping with changes of pulmonary arterial hypertension. Electronically Signed   By: Helyn Numbers M.D.   On: 09/08/2022 20:54    EKG: Pending at this time.  Assessment and Plan  Right hip fracture Secondary to a mechanical fall. X-ray showing comminuted intertrochanteric fractures of the proximal right femur with varus angulation. -Orthopedics consulted and planning on definitive fracture fixation once medically cleared as patient is complaining of chest pain. -Keep n.p.o. after midnight -Continue IV Dilaudid as needed for severe pain -Robaxin as needed for muscle spasms  Chest pain Chest pain is reproducible on exam, ?costochondritis.  ACS less likely as troponin negative.  Patient is mildly tachycardic with heart rate in the 90s, PE felt to be less likely as she is not hypoxic.  CT chest without  contrast negative for acute finding. -Cardiac monitoring -EKG pending -Repeat troponin pending -Check D-dimer, if elevated, CTA chest to rule out PE -Echocardiogram  Bilateral lower extremity edema Chronic per patient.  No prior echo results in the chart.  No evidence of pulmonary  edema on CT chest. -Check D-dimer, if elevated, bilateral lower extremity Dopplers to rule out DVT -Echocardiogram  Leukocytosis Possibly reactive, patient is afebrile.  CT chest without evidence of pneumonia. -Repeat CBC with differential in a.m. -UA -Check procalcitonin  Moderate persistent asthma Stable, no signs of acute exacerbation. -Continue home inhalers after pharmacy med rec is done.  Hypertension Slightly hypertensive. -Pharmacy med rec pending -Hydralazine prn  Allergic rhinitis Atopic dermatitis GERD Depression/anxiety Hypothyroidism: Check TSH. Gout -Pharmacy med rec pending.  DVT prophylaxis: No chemical DVT prophylaxis at this time.  No SCDs until D-dimer is checked. Code Status: DNR (discussed with the patient) Family Communication: No family at bedside. Consults called: Orthopedics Level of care: Telemetry bed Admission status: It is my clinical opinion that admission to INPATIENT is reasonable and necessary because of the expectation that this patient will require hospital care that crosses at least 2 midnights to treat this condition based on the medical complexity of the problems presented.  Given the aforementioned information, the predictability of an adverse outcome is felt to be significant.   John Giovanni MD [facility]  If 7PM-7AM, please contact night-coverage [email or web address]  09/08/2022, 11:21 PM

## 2022-09-09 ENCOUNTER — Encounter (HOSPITAL_COMMUNITY): Payer: Self-pay | Admitting: Internal Medicine

## 2022-09-09 ENCOUNTER — Inpatient Hospital Stay (HOSPITAL_COMMUNITY): Payer: Medicare HMO | Admitting: Anesthesiology

## 2022-09-09 ENCOUNTER — Other Ambulatory Visit: Payer: Self-pay

## 2022-09-09 ENCOUNTER — Encounter (HOSPITAL_COMMUNITY)
Admission: EM | Disposition: A | Discharge status: Skilled Nursing Facility | Payer: Self-pay | Source: Home / Self Care | Attending: Internal Medicine

## 2022-09-09 ENCOUNTER — Ambulatory Visit: Payer: Self-pay | Admitting: Student

## 2022-09-09 ENCOUNTER — Inpatient Hospital Stay (HOSPITAL_COMMUNITY): Payer: Medicare HMO

## 2022-09-09 DIAGNOSIS — J45909 Unspecified asthma, uncomplicated: Secondary | ICD-10-CM | POA: Diagnosis not present

## 2022-09-09 DIAGNOSIS — I10 Essential (primary) hypertension: Secondary | ICD-10-CM

## 2022-09-09 DIAGNOSIS — R600 Localized edema: Secondary | ICD-10-CM | POA: Diagnosis not present

## 2022-09-09 DIAGNOSIS — R079 Chest pain, unspecified: Secondary | ICD-10-CM

## 2022-09-09 DIAGNOSIS — S72001A Fracture of unspecified part of neck of right femur, initial encounter for closed fracture: Secondary | ICD-10-CM | POA: Diagnosis not present

## 2022-09-09 DIAGNOSIS — S72101A Unspecified trochanteric fracture of right femur, initial encounter for closed fracture: Secondary | ICD-10-CM

## 2022-09-09 DIAGNOSIS — Z87891 Personal history of nicotine dependence: Secondary | ICD-10-CM | POA: Diagnosis not present

## 2022-09-09 HISTORY — PX: FEMUR IM NAIL: SHX1597

## 2022-09-09 LAB — CBC WITH DIFFERENTIAL/PLATELET
Abs Immature Granulocytes: 0.1 10*3/uL — ABNORMAL HIGH (ref 0.00–0.07)
Basophils Absolute: 0 10*3/uL (ref 0.0–0.1)
Basophils Relative: 0 %
Eosinophils Absolute: 0 10*3/uL (ref 0.0–0.5)
Eosinophils Relative: 0 %
HCT: 39.2 % (ref 36.0–46.0)
Hemoglobin: 12.8 g/dL (ref 12.0–15.0)
Immature Granulocytes: 1 %
Lymphocytes Relative: 9 %
Lymphs Abs: 1.7 10*3/uL (ref 0.7–4.0)
MCH: 33.5 pg (ref 26.0–34.0)
MCHC: 32.7 g/dL (ref 30.0–36.0)
MCV: 102.6 fL — ABNORMAL HIGH (ref 80.0–100.0)
Monocytes Absolute: 1.2 10*3/uL — ABNORMAL HIGH (ref 0.1–1.0)
Monocytes Relative: 7 %
Neutro Abs: 15.8 10*3/uL — ABNORMAL HIGH (ref 1.7–7.7)
Neutrophils Relative %: 83 %
Platelets: 283 10*3/uL (ref 150–400)
RBC: 3.82 MIL/uL — ABNORMAL LOW (ref 3.87–5.11)
RDW: 14.3 % (ref 11.5–15.5)
WBC: 18.8 10*3/uL — ABNORMAL HIGH (ref 4.0–10.5)
nRBC: 0 % (ref 0.0–0.2)

## 2022-09-09 LAB — URINALYSIS, ROUTINE W REFLEX MICROSCOPIC
Bilirubin Urine: NEGATIVE
Glucose, UA: NEGATIVE mg/dL
Hgb urine dipstick: NEGATIVE
Ketones, ur: 5 mg/dL — AB
Leukocytes,Ua: NEGATIVE
Nitrite: NEGATIVE
Protein, ur: 30 mg/dL — AB
Specific Gravity, Urine: 1.032 — ABNORMAL HIGH (ref 1.005–1.030)
pH: 5 (ref 5.0–8.0)

## 2022-09-09 LAB — MRSA NEXT GEN BY PCR, NASAL: MRSA by PCR Next Gen: NOT DETECTED

## 2022-09-09 LAB — ECHOCARDIOGRAM COMPLETE
Area-P 1/2: 3.45 cm2
Height: 62 in
S' Lateral: 2.9 cm
Weight: 3168 oz

## 2022-09-09 LAB — TROPONIN I (HIGH SENSITIVITY): Troponin I (High Sensitivity): 6 ng/L (ref <18)

## 2022-09-09 LAB — D-DIMER, QUANTITATIVE: D-Dimer, Quant: 2.92 ug/mL-FEU — ABNORMAL HIGH (ref 0.00–0.50)

## 2022-09-09 LAB — PROCALCITONIN: Procalcitonin: <0.1 ng/mL

## 2022-09-09 LAB — TSH: TSH: 3.157 u[IU]/mL (ref 0.350–4.500)

## 2022-09-09 SURGERY — INSERTION, INTRAMEDULLARY ROD, FEMUR
Anesthesia: General | Site: Hip | Laterality: Right

## 2022-09-09 MED ORDER — PROPOFOL 500 MG/50ML IV EMUL
INTRAVENOUS | Status: AC
  Filled 2022-09-09: qty 50

## 2022-09-09 MED ORDER — DEXAMETHASONE SODIUM PHOSPHATE 10 MG/ML IJ SOLN
INTRAMUSCULAR | Status: DC | PRN
  Administered 2022-09-09: 8 mg via INTRAVENOUS

## 2022-09-09 MED ORDER — HYDROMORPHONE HCL 1 MG/ML IJ SOLN
INTRAMUSCULAR | Status: DC | PRN
  Administered 2022-09-09 (×4): .5 mg via INTRAVENOUS

## 2022-09-09 MED ORDER — CEFAZOLIN SODIUM-DEXTROSE 2-4 GM/100ML-% IV SOLN
2.0000 g | INTRAVENOUS | Status: AC
  Administered 2022-09-09: 2 g via INTRAVENOUS
  Filled 2022-09-09: qty 100

## 2022-09-09 MED ORDER — METOCLOPRAMIDE HCL 5 MG/ML IJ SOLN: 5.0000 mg | Freq: Three times a day (TID) | INTRAMUSCULAR | Status: DC | PRN

## 2022-09-09 MED ORDER — SUGAMMADEX SODIUM 200 MG/2ML IV SOLN
INTRAVENOUS | Status: DC | PRN
  Administered 2022-09-09: 200 mg via INTRAVENOUS

## 2022-09-09 MED ORDER — PHENYLEPHRINE HCL (PRESSORS) 10 MG/ML IV SOLN
INTRAVENOUS | Status: DC | PRN
  Administered 2022-09-09: 120 ug via INTRAVENOUS
  Administered 2022-09-09: 100 ug via INTRAVENOUS
  Administered 2022-09-09: 80 ug via INTRAVENOUS
  Administered 2022-09-09: 100 ug via INTRAVENOUS

## 2022-09-09 MED ORDER — POLYETHYLENE GLYCOL 3350 17 G PO PACK: 17.0000 g | PACK | Freq: Every day | ORAL | Status: DC | PRN

## 2022-09-09 MED ORDER — ACETAMINOPHEN 325 MG PO TABS
325.0000 mg | ORAL_TABLET | Freq: Four times a day (QID) | ORAL | Status: DC | PRN
  Administered 2022-09-10 – 2022-09-13 (×7): 650 mg via ORAL
  Filled 2022-09-09 (×7): qty 2

## 2022-09-09 MED ORDER — FENTANYL CITRATE (PF) 100 MCG/2ML IJ SOLN
INTRAMUSCULAR | Status: DC | PRN
  Administered 2022-09-09: 50 ug via INTRAVENOUS
  Administered 2022-09-09 (×2): 25 ug via INTRAVENOUS

## 2022-09-09 MED ORDER — PHENOL 1.4 % MT LIQD: 1.0000 | OROMUCOSAL | Status: DC | PRN

## 2022-09-09 MED ORDER — ACETAMINOPHEN 500 MG PO TABS
1000.0000 mg | ORAL_TABLET | Freq: Once | ORAL | Status: AC
  Administered 2022-09-09: 1000 mg via ORAL
  Filled 2022-09-09: qty 2

## 2022-09-09 MED ORDER — UMECLIDINIUM BROMIDE 62.5 MCG/ACT IN AEPB
1.0000 | INHALATION_SPRAY | Freq: Every day | RESPIRATORY_TRACT | Status: DC
  Administered 2022-09-10 – 2022-09-14 (×5): 1 via RESPIRATORY_TRACT
  Filled 2022-09-09: qty 7

## 2022-09-09 MED ORDER — CLONAZEPAM 1 MG PO TABS: 1.0000 mg | ORAL_TABLET | Freq: Three times a day (TID) | ORAL | Status: DC | PRN

## 2022-09-09 MED ORDER — SPIRONOLACTONE 25 MG PO TABS: 25.0000 mg | ORAL_TABLET | Freq: Every day | ORAL | Status: DC

## 2022-09-09 MED ORDER — METHOCARBAMOL 500 MG IVPB - SIMPLE MED
500.0000 mg | Freq: Four times a day (QID) | INTRAVENOUS | Status: DC | PRN
  Filled 2022-09-09: qty 55

## 2022-09-09 MED ORDER — FUROSEMIDE 40 MG PO TABS
80.0000 mg | ORAL_TABLET | Freq: Every day | ORAL | Status: DC
  Administered 2022-09-10 – 2022-09-14 (×5): 80 mg via ORAL
  Filled 2022-09-09 (×5): qty 2

## 2022-09-09 MED ORDER — DOCUSATE SODIUM 100 MG PO CAPS
100.0000 mg | ORAL_CAPSULE | Freq: Two times a day (BID) | ORAL | Status: DC
  Administered 2022-09-11 – 2022-09-14 (×7): 100 mg via ORAL
  Filled 2022-09-09 (×11): qty 1

## 2022-09-09 MED ORDER — MENTHOL 3 MG MT LOZG: 1.0000 | LOZENGE | OROMUCOSAL | Status: DC | PRN

## 2022-09-09 MED ORDER — PRIMIDONE 50 MG PO TABS
50.0000 mg | ORAL_TABLET | Freq: Two times a day (BID) | ORAL | Status: DC
  Administered 2022-09-09 – 2022-09-14 (×11): 50 mg via ORAL
  Filled 2022-09-09 (×12): qty 1

## 2022-09-09 MED ORDER — SENNA 8.6 MG PO TABS
1.0000 | ORAL_TABLET | Freq: Two times a day (BID) | ORAL | Status: DC
  Administered 2022-09-11 – 2022-09-13 (×5): 8.6 mg via ORAL
  Filled 2022-09-09 (×8): qty 1

## 2022-09-09 MED ORDER — HYDROMORPHONE HCL 1 MG/ML IJ SOLN
0.5000 mg | INTRAMUSCULAR | Status: DC | PRN
  Administered 2022-09-10: 1 mg via INTRAVENOUS
  Filled 2022-09-09: qty 1

## 2022-09-09 MED ORDER — 0.9 % SODIUM CHLORIDE (POUR BTL) OPTIME
TOPICAL | Status: DC | PRN
  Administered 2022-09-09: 1000 mL

## 2022-09-09 MED ORDER — ONDANSETRON HCL 4 MG/2ML IJ SOLN: 4.0000 mg | Freq: Four times a day (QID) | INTRAMUSCULAR | Status: DC | PRN

## 2022-09-09 MED ORDER — ROCURONIUM BROMIDE 100 MG/10ML IV SOLN
INTRAVENOUS | Status: DC | PRN
  Administered 2022-09-09: 60 mg via INTRAVENOUS

## 2022-09-09 MED ORDER — LIDOCAINE HCL (PF) 2 % IJ SOLN
INTRAMUSCULAR | Status: AC
  Filled 2022-09-09: qty 5

## 2022-09-09 MED ORDER — PANTOPRAZOLE SODIUM 40 MG PO TBEC
40.0000 mg | DELAYED_RELEASE_TABLET | Freq: Every day | ORAL | Status: DC
  Administered 2022-09-10 – 2022-09-14 (×5): 40 mg via ORAL
  Filled 2022-09-09 (×5): qty 1

## 2022-09-09 MED ORDER — FUROSEMIDE 40 MG PO TABS: 80.0000 mg | ORAL_TABLET | Freq: Every day | ORAL | Status: DC

## 2022-09-09 MED ORDER — BUDESON-GLYCOPYRROL-FORMOTEROL 160-9-4.8 MCG/ACT IN AERO: 2.0000 | INHALATION_SPRAY | Freq: Two times a day (BID) | RESPIRATORY_TRACT | Status: DC

## 2022-09-09 MED ORDER — METOCLOPRAMIDE HCL 5 MG PO TABS: 5.0000 mg | ORAL_TABLET | Freq: Three times a day (TID) | ORAL | Status: DC | PRN

## 2022-09-09 MED ORDER — TRANEXAMIC ACID-NACL 1000-0.7 MG/100ML-% IV SOLN
1000.0000 mg | INTRAVENOUS | Status: AC
  Administered 2022-09-09: 1000 mg via INTRAVENOUS
  Filled 2022-09-09: qty 100

## 2022-09-09 MED ORDER — OXYCODONE HCL 5 MG PO TABS
5.0000 mg | ORAL_TABLET | ORAL | Status: DC | PRN
  Administered 2022-09-12 (×2): 5 mg via ORAL
  Administered 2022-09-12 – 2022-09-14 (×5): 10 mg via ORAL
  Filled 2022-09-09: qty 1
  Filled 2022-09-09 (×4): qty 2
  Filled 2022-09-09: qty 1
  Filled 2022-09-09: qty 2

## 2022-09-09 MED ORDER — ENOXAPARIN SODIUM 40 MG/0.4ML IJ SOSY
40.0000 mg | PREFILLED_SYRINGE | INTRAMUSCULAR | Status: DC
  Administered 2022-09-10 – 2022-09-14 (×5): 40 mg via SUBCUTANEOUS
  Filled 2022-09-09 (×5): qty 0.4

## 2022-09-09 MED ORDER — PHENYLEPHRINE 80 MCG/ML (10ML) SYRINGE FOR IV PUSH (FOR BLOOD PRESSURE SUPPORT)
PREFILLED_SYRINGE | INTRAVENOUS | Status: AC
  Filled 2022-09-09: qty 10

## 2022-09-09 MED ORDER — MIDAZOLAM HCL 5 MG/5ML IJ SOLN
INTRAMUSCULAR | Status: DC | PRN
  Administered 2022-09-09: 1 mg via INTRAVENOUS
  Administered 2022-09-09 (×2): .5 mg via INTRAVENOUS

## 2022-09-09 MED ORDER — HYDROXYZINE HCL 25 MG PO TABS
25.0000 mg | ORAL_TABLET | Freq: Three times a day (TID) | ORAL | Status: DC
  Administered 2022-09-09 – 2022-09-14 (×16): 25 mg via ORAL
  Filled 2022-09-09 (×16): qty 1

## 2022-09-09 MED ORDER — POTASSIUM CHLORIDE CRYS ER 10 MEQ PO TBCR
10.0000 meq | EXTENDED_RELEASE_TABLET | Freq: Two times a day (BID) | ORAL | Status: DC
  Administered 2022-09-09 – 2022-09-14 (×11): 10 meq via ORAL
  Filled 2022-09-09 (×11): qty 1

## 2022-09-09 MED ORDER — LIDOCAINE HCL (CARDIAC) PF 100 MG/5ML IV SOSY
PREFILLED_SYRINGE | INTRAVENOUS | Status: DC | PRN
  Administered 2022-09-09 (×2): 50 mg via INTRAVENOUS

## 2022-09-09 MED ORDER — CHLORHEXIDINE GLUCONATE 4 % EX LIQD: 60.0000 mL | Freq: Once | CUTANEOUS | Status: DC

## 2022-09-09 MED ORDER — LEVOTHYROXINE SODIUM 100 MCG PO TABS
100.0000 ug | ORAL_TABLET | Freq: Every day | ORAL | Status: DC
  Administered 2022-09-10 – 2022-09-14 (×5): 100 ug via ORAL
  Filled 2022-09-09 (×5): qty 1

## 2022-09-09 MED ORDER — SPIRONOLACTONE 25 MG PO TABS
25.0000 mg | ORAL_TABLET | Freq: Every day | ORAL | Status: DC
  Administered 2022-09-10 – 2022-09-14 (×5): 25 mg via ORAL
  Filled 2022-09-09 (×5): qty 1

## 2022-09-09 MED ORDER — DEXAMETHASONE SODIUM PHOSPHATE 10 MG/ML IJ SOLN
INTRAMUSCULAR | Status: AC
  Filled 2022-09-09: qty 1

## 2022-09-09 MED ORDER — TIZANIDINE HCL 4 MG PO TABS
2.0000 mg | ORAL_TABLET | Freq: Three times a day (TID) | ORAL | Status: DC
  Administered 2022-09-09 – 2022-09-11 (×5): 2 mg via ORAL
  Filled 2022-09-09 (×5): qty 1

## 2022-09-09 MED ORDER — ALBUTEROL SULFATE (2.5 MG/3ML) 0.083% IN NEBU
2.5000 mg | INHALATION_SOLUTION | Freq: Four times a day (QID) | RESPIRATORY_TRACT | Status: DC | PRN
  Administered 2022-09-11: 2.5 mg via RESPIRATORY_TRACT
  Filled 2022-09-09: qty 3

## 2022-09-09 MED ORDER — FENTANYL CITRATE (PF) 100 MCG/2ML IJ SOLN
INTRAMUSCULAR | Status: AC
  Filled 2022-09-09: qty 2

## 2022-09-09 MED ORDER — POVIDONE-IODINE 10 % EX SWAB: 2.0000 | Freq: Once | CUTANEOUS | Status: DC

## 2022-09-09 MED ORDER — PROPOFOL 10 MG/ML IV BOLUS
INTRAVENOUS | Status: DC | PRN
  Administered 2022-09-09: 110 mg via INTRAVENOUS
  Administered 2022-09-09: 40 mg via INTRAVENOUS

## 2022-09-09 MED ORDER — ACETAMINOPHEN 325 MG PO TABS: 650.0000 mg | ORAL_TABLET | Freq: Four times a day (QID) | ORAL | Status: DC | PRN

## 2022-09-09 MED ORDER — OXYCODONE HCL 5 MG PO TABS
10.0000 mg | ORAL_TABLET | ORAL | Status: DC | PRN
  Administered 2022-09-09 – 2022-09-10 (×3): 10 mg via ORAL
  Administered 2022-09-10 – 2022-09-11 (×3): 15 mg via ORAL
  Administered 2022-09-11: 10 mg via ORAL
  Administered 2022-09-13 – 2022-09-14 (×5): 15 mg via ORAL
  Filled 2022-09-09: qty 2
  Filled 2022-09-09 (×3): qty 3
  Filled 2022-09-09: qty 2
  Filled 2022-09-09 (×2): qty 3
  Filled 2022-09-09 (×2): qty 2
  Filled 2022-09-09 (×3): qty 3

## 2022-09-09 MED ORDER — LACTATED RINGERS IV SOLN: INTRAVENOUS | Status: DC

## 2022-09-09 MED ORDER — ISOPROPYL ALCOHOL 70 % SOLN
Status: DC | PRN
  Administered 2022-09-09: 1 via TOPICAL

## 2022-09-09 MED ORDER — FLUTICASONE FUROATE-VILANTEROL 200-25 MCG/ACT IN AEPB
1.0000 | INHALATION_SPRAY | Freq: Every day | RESPIRATORY_TRACT | Status: DC
  Administered 2022-09-10 – 2022-09-14 (×5): 1 via RESPIRATORY_TRACT
  Filled 2022-09-09: qty 28

## 2022-09-09 MED ORDER — MIDAZOLAM HCL 2 MG/2ML IJ SOLN
INTRAMUSCULAR | Status: AC
  Filled 2022-09-09: qty 2

## 2022-09-09 MED ORDER — ONDANSETRON HCL 4 MG/2ML IJ SOLN
INTRAMUSCULAR | Status: AC
  Filled 2022-09-09: qty 2

## 2022-09-09 MED ORDER — LACTATED RINGERS IV SOLN: INTRAVENOUS | Status: DC | PRN

## 2022-09-09 MED ORDER — ALLOPURINOL 300 MG PO TABS
300.0000 mg | ORAL_TABLET | Freq: Every day | ORAL | Status: DC
  Administered 2022-09-10 – 2022-09-14 (×5): 300 mg via ORAL
  Filled 2022-09-09 (×5): qty 1

## 2022-09-09 MED ORDER — METHOCARBAMOL 500 MG PO TABS
500.0000 mg | ORAL_TABLET | Freq: Four times a day (QID) | ORAL | Status: DC | PRN
  Administered 2022-09-11 – 2022-09-14 (×6): 500 mg via ORAL
  Filled 2022-09-09 (×6): qty 1

## 2022-09-09 MED ORDER — ONDANSETRON HCL 4 MG PO TABS
4.0000 mg | ORAL_TABLET | Freq: Four times a day (QID) | ORAL | Status: DC | PRN
  Administered 2022-09-11: 4 mg via ORAL
  Filled 2022-09-09: qty 1

## 2022-09-09 MED ORDER — HYDROMORPHONE HCL 2 MG/ML IJ SOLN
INTRAMUSCULAR | Status: AC
  Filled 2022-09-09: qty 1

## 2022-09-09 MED ORDER — ONDANSETRON HCL 4 MG/2ML IJ SOLN
INTRAMUSCULAR | Status: DC | PRN
  Administered 2022-09-09: 4 mg via INTRAVENOUS

## 2022-09-09 MED ORDER — CEFAZOLIN SODIUM-DEXTROSE 2-4 GM/100ML-% IV SOLN
2.0000 g | Freq: Four times a day (QID) | INTRAVENOUS | Status: AC
  Administered 2022-09-09 – 2022-09-10 (×2): 2 g via INTRAVENOUS
  Filled 2022-09-09 (×2): qty 100

## 2022-09-09 SURGICAL SUPPLY — 60 items
ADH SKN CLS APL DERMABOND .7 (GAUZE/BANDAGES/DRESSINGS) ×1
ADH SKN CLS LQ APL DERMABOND (GAUZE/BANDAGES/DRESSINGS) ×1
APL PRP STRL LF DISP 70% ISPRP (MISCELLANEOUS) ×1
BAG COUNTER SPONGE SURGICOUNT (BAG) IMPLANT
BAG SPEC THK2 15X12 ZIP CLS (MISCELLANEOUS)
BAG SPNG CNTER NS LX DISP (BAG) ×1
BAG ZIPLOCK 12X15 (MISCELLANEOUS) IMPLANT
BIT DRILL 4.3MMS DISTAL GRDTED (BIT) IMPLANT
BIT DRILL LAG SCREW (DRILL) IMPLANT
CHLORAPREP W/TINT 26 (MISCELLANEOUS) ×1 IMPLANT
COVER PERINEAL POST (MISCELLANEOUS) ×1 IMPLANT
COVER SURGICAL LIGHT HANDLE (MISCELLANEOUS) ×1 IMPLANT
DERMABOND ADVANCED .7 DNX12 (GAUZE/BANDAGES/DRESSINGS) ×1 IMPLANT
DERMABOND ADVANCED .7 DNX6 (GAUZE/BANDAGES/DRESSINGS) IMPLANT
DRAPE C-ARM 42X120 X-RAY (DRAPES) ×1 IMPLANT
DRAPE C-ARMOR (DRAPES) ×1 IMPLANT
DRAPE SHEET LG 3/4 BI-LAMINATE (DRAPES) ×1 IMPLANT
DRAPE STERI IOBAN 125X83 (DRAPES) ×1 IMPLANT
DRAPE U-SHAPE 47X51 STRL (DRAPES) ×2 IMPLANT
DRESSING MEPILEX FLEX 4X4 (GAUZE/BANDAGES/DRESSINGS) IMPLANT
DRILL 4.3MMS DISTAL GRADUATED (BIT) ×1
DRILL LAG SCREW (DRILL) ×1
DRSG AQUACEL AG ADV 3.5X 6 (GAUZE/BANDAGES/DRESSINGS) ×1 IMPLANT
DRSG AQUACEL AG ADV 3.5X10 (GAUZE/BANDAGES/DRESSINGS) ×1 IMPLANT
DRSG MEPILEX FLEX 4X4 (GAUZE/BANDAGES/DRESSINGS) ×2
DRSG MEPILEX POST OP 4X8 (GAUZE/BANDAGES/DRESSINGS) IMPLANT
FACESHIELD WRAPAROUND (MASK) ×3 IMPLANT
FACESHIELD WRAPAROUND OR TEAM (MASK) ×3 IMPLANT
GAUZE SPONGE 4X4 12PLY STRL (GAUZE/BANDAGES/DRESSINGS) ×1 IMPLANT
GLOVE BIO SURGEON STRL SZ8.5 (GLOVE) ×2 IMPLANT
GLOVE BIOGEL M 7.0 STRL (GLOVE) ×1 IMPLANT
GLOVE BIOGEL PI IND STRL 7.5 (GLOVE) ×1 IMPLANT
GLOVE BIOGEL PI IND STRL 8 (GLOVE) ×1 IMPLANT
GLOVE BIOGEL PI IND STRL 8.5 (GLOVE) ×1 IMPLANT
GLOVE INDICATOR 7.5 STRL GRN (GLOVE) ×1 IMPLANT
GLOVE SURG LX STRL 8.0 MICRO (GLOVE) ×3 IMPLANT
GOWN SPEC L3 XXLG W/TWL (GOWN DISPOSABLE) ×2 IMPLANT
GUIDEPIN VERSANAIL DSP 3.2X444 (ORTHOPEDIC DISPOSABLE SUPPLIES) IMPLANT
GUIDEWIRE BALL NOSE 100CM (WIRE) IMPLANT
HIP FRAC NAIL LAG SCR 10.5X100 (Orthopedic Implant) ×1 IMPLANT
JET LAVAGE IRRISEPT WOUND (IRRIGATION / IRRIGATOR) ×1
KIT BASIN OR (CUSTOM PROCEDURE TRAY) ×1 IMPLANT
KIT TURNOVER KIT A (KITS) IMPLANT
LAVAGE JET IRRISEPT WOUND (IRRIGATION / IRRIGATOR) IMPLANT
MANIFOLD NEPTUNE II (INSTRUMENTS) ×1 IMPLANT
MARKER SKIN DUAL TIP RULER LAB (MISCELLANEOUS) ×1 IMPLANT
NAIL IM ANG AFFIXUS 11X380 RT (Nail) IMPLANT
PACK TOTAL JOINT (CUSTOM PROCEDURE TRAY) ×1 IMPLANT
SCREW BONE CORTICAL 5.0X44 (Screw) IMPLANT
SCREW BONE CORTICAL 5.0X52 (Screw) IMPLANT
SCREW CANN THRD AFF 10.5X100 (Orthopedic Implant) IMPLANT
SCREWDRIVER HEX TIP 3.5MM (MISCELLANEOUS) IMPLANT
SUT MNCRL AB 3-0 PS2 18 (SUTURE) IMPLANT
SUT MON AB 2-0 CT1 36 (SUTURE) ×1 IMPLANT
SUT VIC AB 1 CT1 27 (SUTURE) ×1
SUT VIC AB 1 CT1 27XBRD ANTBC (SUTURE) ×1 IMPLANT
TOWEL OR 17X26 10 PK STRL BLUE (TOWEL DISPOSABLE) ×1 IMPLANT
TOWEL OR NON WOVEN STRL DISP B (DISPOSABLE) ×1 IMPLANT
TRAY FOLEY MTR SLVR 14FR STAT (SET/KITS/TRAYS/PACK) ×1 IMPLANT
YANKAUER SUCT BULB TIP NO VENT (SUCTIONS) ×1 IMPLANT

## 2022-09-09 NOTE — Anesthesia Procedure Notes (Signed)
Procedure Name: Intubation Date/Time: 09/09/2022 1:44 PM  Performed by: Garrel Ridgel, CRNAPre-anesthesia Checklist: Patient identified, Emergency Drugs available, Suction available and Patient being monitored Patient Re-evaluated:Patient Re-evaluated prior to induction Oxygen Delivery Method: Circle system utilized Preoxygenation: Pre-oxygenation with 100% oxygen Induction Type: IV induction Ventilation: Mask ventilation without difficulty Laryngoscope Size: Mac and 3 Grade View: Grade II Tube type: Oral Tube size: 7.0 mm Number of attempts: 1 Airway Equipment and Method: Stylet Placement Confirmation: ETT inserted through vocal cords under direct vision, positive ETCO2 and breath sounds checked- equal and bilateral Secured at: 21 cm Tube secured with: Tape Dental Injury: Teeth and Oropharynx as per pre-operative assessment

## 2022-09-09 NOTE — H&P (Incomplete)
History and Physical    Elaine Byrd [id number] DOB: December 06, 1945 DOA: 09/08/2022  PCP: Karleen Hampshire., MD  Patient coming from: Home  Chief Complaint: Right hip pain  HPI: Elaine Byrd is a 76 y.o. female with medical history significant of moderate persistent asthma, allergic rhinitis, atopic dermatitis, stinking insect allergy, GERD, depression, hypothyroidism, hypertension, neuropathy, gout presented to the ED complaining of right hip pain after a mechanical fall at home.  Patient also complained of chest pain.  Vital signs on arrival to the ED: Temperature 97.8 F, pulse 97, respiratory rate 18, blood pressure 143/93, SPO2 98% on room air.  Labs showing WBC 25.3, glucose 174, troponin negative.  X-ray of right hip/pelvis showing comminuted intertrochanteric fractures of the proximal right femur with varus angulation.  CT chest without contrast and x-rays of right knee/right shoulder negative for acute finding. Patient was given Dilaudid, primidone, and tizanidine.  Orthopedics consulted. [facility] called to admit.   Patient reports mechanical fall at home this afternoon.  She went to pick up her phone and as she was turning back, accidentally tripped on a small stool and fell onto her right hip.  She experienced sudden severe pain in her right hip at that time and was not able to get up from the floor.  Denies head injury or loss of consciousness.  She also reports ongoing substernal chest pain which started 8 days ago as she was trying to wear her bra.  Patient states she had her hands behind her back and was trying to hook her bra when she heard a loud pop from the middle of her chest.  She has tried taking Tylenol at home but the pain has persisted.  She describes it as sharp substernal chest pain.  Reports chronic dyspnea secondary to asthma, no recent change.  Patient also reports chronic bilateral lower extremity edema and chronic diarrhea since after she had COVID 2 years ago.  She takes Imodium  at home as needed and not having active diarrhea at this time.  Denies fevers, nausea, vomiting, abdominal pain, or any urinary symptoms.  Review of Systems:  Review of Systems  All other systems reviewed and are negative.   Past Medical History:  Diagnosis Date  . Acid reflux   . Asthma   . Depression   . Hypothyroidism   . Neuropathy     Past Surgical History:  Procedure Laterality Date  . ABDOMINAL HYSTERECTOMY    . CATARACT EXTRACTION BILATERAL W/ ANTERIOR VITRECTOMY Bilateral 12/15 and 01/16  . CERVICAL LAMINECTOMY    . CHOLECYSTECTOMY    . LUMBAR LAMINECTOMY    . NASAL SINUS SURGERY    . NECK SURGERY    . ROTATOR CUFF REPAIR Right 12/18/2019     reports that she quit smoking about 41 years ago. Her smoking use included cigarettes. She has a 10.00 pack-year smoking history. She has never used smokeless tobacco. She reports current alcohol use of about 1.0 standard drink of alcohol per week. She reports that she does not use drugs.  Allergies  Allergen Reactions  . Morphine And Related Other (See Comments)    hallucinations  . Penicillins Other (See Comments)    Childhood reaction, cannot remember  . Sulfa Antibiotics Other (See Comments) and Diarrhea    itchiness    Family History  Problem Relation Age of Onset  . Depression Sister   . Suicidality Sister   . Bipolar disorder Sister   . Depression Mother   .  Allergic rhinitis Mother   . Angioedema Neg Hx   . Asthma Neg Hx   . Eczema Neg Hx   . Immunodeficiency Neg Hx   . Urticaria Neg Hx     Prior to Admission medications   Medication Sig Start Date End Date Taking? Authorizing Provider  acetaminophen (TYLENOL) 500 MG tablet Take by mouth.    [provider]  albuterol (VENTOLIN HFA) 108 (90 Base) MCG/ACT inhaler Inhale 2 puffs into the lungs every 4 (four) hours as needed for wheezing or shortness of breath. 12/25/20   Ambs, Kathrine Cords, FNP  allopurinol (ZYLOPRIM) 300 MG tablet Take by mouth. 01/26/22    [provider]  B Complex-Biotin-FA (B COMPLETE PO) Take 50 mg by mouth daily.    [provider]  Bioflavonoid Products (ESTER-C) 500-550 MG TABS Take by mouth.    [provider]  BREZTRI AEROSPHERE 160-9-4.8 MCG/ACT AERO INHALE 2 PUFFS TWICE DAILY 07/19/22   Ambs, Kathrine Cords, FNP  clonazePAM (KLONOPIN) 2 MG tablet Take by mouth. 02/12/22   [provider]  co-enzyme Q-10 30 MG capsule Take 100 mg by mouth daily.    [provider]  colchicine 0.6 MG tablet Take 0.6 mg by mouth daily. 07/12/22   [provider]  Cyanocobalamin (B-12) 2500 MCG TABS Take by mouth.    [provider]  dupilumab (DUPIXENT) 300 MG/2ML prefilled syringe Inject 300 mg into the skin every 14 (fourteen) days.    [provider]  EPINEPHrine 0.3 mg/0.3 mL IJ SOAJ injection Use as directed for severe allergic reactions 07/16/22   Ambs, Kathrine Cords, FNP  furosemide (LASIX) 20 MG tablet Take 20 mg by mouth every morning.    [provider]  hydrOXYzine (ATARAX/VISTARIL) 25 MG tablet Take 1 tablet (25 mg total) by mouth 3 (three) times daily. 04/25/19   Charlies Silvers, MD  Lactobacillus Rhamnosus, GG, (RA PROBIOTIC DIGESTIVE CARE) CAPS Take by mouth.    [provider]  levothyroxine (SYNTHROID, LEVOTHROID) 100 MCG tablet Take 100 mcg by mouth daily before breakfast.    [provider]  loperamide (IMODIUM) 2 MG capsule Take by mouth. 09/07/21   [provider]  Loratadine-Pseudoephedrine (CLARITIN-D 12 HOUR PO) Take by mouth.    [provider]  Multiple Vitamins-Minerals (MULTIVITAMIN ADULT) TABS Take by mouth.    [provider]  pantoprazole (PROTONIX) 40 MG tablet Take 1 tablet by mouth daily. 08/10/21   [provider]  potassium chloride (KLOR-CON M) 10 MEQ tablet Take by mouth. 01/17/22   [provider]  primidone (MYSOLINE) 50 MG tablet Take 50 mg by mouth 2 (two) times daily. 06/08/22    [provider]  Probiotic Product (CULTURELLE PROBIOTICS PO) Take by mouth 2 (two) times daily.    [provider]  spironolactone (ALDACTONE) 25 MG tablet Take 25 mg by mouth daily. 07/05/22   [provider]  tiZANidine (ZANAFLEX) 4 MG tablet Take 1 tablet by mouth 3 (three) times daily as needed. 06/22/21   [provider]  tiZANidine (ZANAFLEX) 4 MG tablet Take by mouth. Patient not taking: Reported on 07/14/2022 09/01/21   [provider]  Vitamin D-Vitamin K (VITAMIN K2-VITAMIN D3 PO) Take 5,000 mg by mouth daily.    [provider]    Physical Exam: Vitals:   09/08/22 1906 09/08/22 1912  BP: (!) 143/93   Pulse: 97   Resp:  18  Temp: 97.8 F (36.6 C)   TempSrc: Oral  SpO2: 98%   Weight:  89.8 kg  Height:  5\' 2"  (1.575 m)    Physical Exam Vitals reviewed.  Constitutional:      General: She is not in acute distress. HENT:     Head: Normocephalic and atraumatic.  Eyes:     Extraocular Movements: Extraocular movements intact.  Cardiovascular:     Rate and Rhythm: Normal rate and regular rhythm.     Pulses: Normal pulses.  Pulmonary:     Effort: Pulmonary effort is normal. No respiratory distress.     Breath sounds: Normal breath sounds. No wheezing or rales.  Abdominal:     General: Bowel sounds are normal. There is no distension.     Palpations: Abdomen is soft.     Tenderness: There is no abdominal tenderness.  Musculoskeletal:     Cervical back: Normal range of motion.     Right lower leg: Edema present.     Left lower leg: Edema present.     Comments: Chest pain reproducible upon palpation of the sternum  Skin:    General: Skin is warm and dry.     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     Mental Status: She is alert and oriented to person, place, and time.     Labs on Admission: I have personally reviewed following  labs and imaging studies  CBC: Recent Labs  Lab 09/08/22 2030  WBC 25.3*  HGB 13.4  HCT 40.9  MCV 100.5*  PLT [id number]   Basic Metabolic Panel: Recent Labs  Lab 09/08/22 2030  NA 137  K 3.6  CL 103  CO2 23  GLUCOSE 174*  BUN 15  CREATININE 0.92  CALCIUM 9.2   GFR: Estimated Creatinine Clearance: 55.9 mL/min (by C-G formula based on SCr of 0.92 mg/dL). Liver Function Tests: No results for input(s): "AST", "ALT", "ALKPHOS", "BILITOT", "PROT", "ALBUMIN" in the last 168 hours. No results for input(s): "LIPASE", "AMYLASE" in the last 168 hours. No results for input(s): "AMMONIA" in the last 168 hours. Coagulation Profile: No results for input(s): "INR", "PROTIME" in the last 168 hours. Cardiac Enzymes: No results for input(s): "CKTOTAL", "CKMB", "CKMBINDEX", "TROPONINI" in the last 168 hours. BNP (last 3 results) No results for input(s): "PROBNP" in the last 8760 hours. HbA1C: No results for input(s): "HGBA1C" in the last 72 hours. CBG: No results for input(s): "GLUCAP" in the last 168 hours. Lipid Profile: No results for input(s): "CHOL", "HDL", "LDLCALC", "TRIG", "CHOLHDL", "LDLDIRECT" in the last 72 hours. Thyroid Function Tests: No results for input(s): "TSH", "T4TOTAL", "FREET4", "T3FREE", "THYROIDAB" in the last 72 hours. Anemia Panel: No results for input(s): "VITAMINB12", "FOLATE", "FERRITIN", "TIBC", "IRON", "RETICCTPCT" in the last 72 hours. Urine analysis:    Component Value Date/Time   COLORURINE AMBER (A) 02/01/2013 2002   APPEARANCEUR CLEAR 02/01/2013 2002   LABSPEC >1.046 (H) 02/01/2013 2002   PHURINE 5.0 02/01/2013 2002   GLUCOSEU NEGATIVE 02/01/2013 2002   HGBUR NEGATIVE 02/01/2013 2002   BILIRUBINUR SMALL (A) 02/01/2013 2002   KETONESUR NEGATIVE 02/01/2013 2002   PROTEINUR NEGATIVE 02/01/2013 2002   UROBILINOGEN 0.2 02/01/2013 2002   NITRITE NEGATIVE 02/01/2013 2002   LEUKOCYTESUR SMALL (A) 02/01/2013 2002    Radiological Exams on Admission: DG  Shoulder Right  Result Date: 09/08/2022 CLINICAL DATA:  Pain and limited movement after a fall. EXAM: RIGHT SHOULDER - 2+ VIEW COMPARISON:  None Available. FINDINGS: No acute fracture  or dislocation demonstrated in the right shoulder. No focal bone lesion or bone destruction. Soft tissues are unremarkable. IMPRESSION: No acute bony abnormalities are demonstrated. Electronically Signed   By: Lucienne Capers M.D.   On: 09/08/2022 21:16   DG Knee Complete 4 Views Right  Result Date: 09/08/2022 CLINICAL DATA:  Pain after a fall. EXAM: RIGHT KNEE - COMPLETE 4+ VIEW COMPARISON:  None Available. FINDINGS: Evaluation is limited due to nonstandard positioning. As visualized, no acute displaced fractures are identified. No focal bone lesions. No significant effusion. IMPRESSION: Technically limited study due to nonstandard positioning. No acute displaced fractures are visualized. Electronically Signed   By: Lucienne Capers M.D.   On: 09/08/2022 21:14   DG Hip Unilat W or Wo Pelvis 2-3 Views Right  Result Date: 09/08/2022 CLINICAL DATA:  Pain and deformity after a fall. EXAM: DG HIP (WITH OR WITHOUT PELVIS) 2-3V RIGHT COMPARISON:  None Available. FINDINGS: Comminuted inter trochanteric fractures of the proximal right femur with medial displacement and impaction of the distal fracture fragments resulting in varus angulation. Degenerative changes in the right hip. No dislocation of the hip joint. Degenerative changes in the lower lumbar spine and left hip. SI joints and symphysis pubis are not displaced. IMPRESSION: Comminuted inter trochanteric fractures of the proximal right femur with varus angulation. Electronically Signed   By: Lucienne Capers M.D.   On: 09/08/2022 21:14   CT Chest Wo Contrast  Result Date: 09/08/2022 CLINICAL DATA:  Chest trauma, blunt.  Fall EXAM: CT CHEST WITHOUT CONTRAST TECHNIQUE: Multidetector CT imaging of the chest was performed following the standard protocol without IV  contrast. RADIATION DOSE REDUCTION: This exam was performed according to the departmental dose-optimization program which includes automated exposure control, adjustment of the mA and/or kV according to patient size and/or use of iterative reconstruction technique. COMPARISON:  None Available. FINDINGS: Cardiovascular: Mild coronary artery calcification. Global cardiac size within normal limits. No pericardial effusion. Central pulmonary arteries are enlarged in keeping with changes of pulmonary arterial hypertension. Moderate atherosclerotic calcification within the thoracic aorta. No aortic aneurysm. Mediastinum/Nodes: No pathologic thoracic adenopathy. Esophagus unremarkable. Lungs/Pleura: Lungs are clear. No pleural effusion or pneumothorax. Upper Abdomen: No acute abnormality.  Status post cholecystectomy Musculoskeletal: Remote compression deformities of T11 and T12 are noted. Healed right fifth rib and scapular fracture formed ease are noted. Osseous structures are otherwise age-appropriate. No acute bone abnormality. No lytic or blastic bone lesion IMPRESSION: 1. No acute intrathoracic pathology identified. 2. Mild coronary artery calcification. 3. Enlarged central pulmonary arteries in keeping with changes of pulmonary arterial hypertension. Electronically Signed   By: Fidela Salisbury M.D.   On: 09/08/2022 20:54    EKG: Pending at this time.  Assessment and Plan  Right hip fracture Secondary to mechanical fall. X-ray of right hip/pelvis showing comminuted intertrochanteric fractures of the proximal right femur with varus angulation. -Orthopedics consulted and planning on definitive fracture fixation once medically cleared as patient is complaining of chest pain. -Keep n.p.o. after midnight -Continue IV Dilaudid as needed for severe pain -Robaxin as needed for muscle spasms -Nonweightbearing  Chest pain Possibly due to costochondritis, chest pain is reproducible on exam.  ACS less likely as  troponin negative.  PE less likely as patient is not tachycardic at present and also not hypoxic.  CT chest without contrast negative for acute finding. -Cardiac monitoring -EKG pending -Repeat troponin -Check D-dimer, if elevated, CTA chest to rule out PE -Echocardiogram  Leukocytosis Possibly reactive, patient is afebrile.  CT chest  without evidence of pneumonia. -Repeat CBC with differential in a.m. -UA -Check procalcitonin  Moderate persistent asthma Stable, no signs of acute exacerbation. -Continue home meds after pharmacy med rec is done.  Hypertension Slightly hypertensive. -Pharmacy med rec pending -Hydralazine prn  Allergic rhinitis Atopic dermatitis GERD Depression/anxiety Hypothyroidism: Check TSH. Gout -Pharmacy med rec pending.  DVT prophylaxis: No chemical DVT prophylaxis at this time.  No SCDs until D-dimer is checked. Code Status: {Blank single:19197::"Full Code","DNR","DNR/DNI","Comfort Care","***"} Family Communication: ***  Consults called: Orthopedics Level of care: Telemetry bed Admission status: It is my clinical opinion that admission to INPATIENT is reasonable and necessary because of the expectation that this patient will require hospital care that crosses at least 2 midnights to treat this condition based on the medical complexity of the problems presented.  Given the aforementioned information, the predictability of an adverse outcome is felt to be significant.   Shela Leff MD [facility]  If 7PM-7AM, please contact night-coverage [email or web address]  09/08/2022, 11:21 PM

## 2022-09-09 NOTE — Progress Notes (Signed)
Pt is being transported via bed to short stay for pending surgery.  Pt remains alert and oriented.  CHG bath and has given and teeth brushed.  Consent filled out, but not signed.  Pt continues to wait to speak to MD regarding surgery.  Dawn, RN called and given report at this time.

## 2022-09-09 NOTE — Progress Notes (Signed)
PROGRESS NOTE    Elaine Byrd  JSE:[id number] DOB: 11-10-1946 DOA: 09/08/2022 PCP: Karleen Hampshire., MD    Brief Narrative:  76 y/o female admitted to the hospital after mechanical fall resulting in right hip fracture. She also complains of chest pain that appears to musculoskeletal in origin. Ortho following for operative management    Assessment & Plan:   Principal Problem:   Hip fracture (HCC) Active Problems:   Depression   Hypothyroidism   GERD (gastroesophageal reflux disease)   Essential hypertension   GAD (generalized anxiety disorder)   Moderate persistent asthma   Chest pain   Leukocytosis   Bilateral lower extremity edema   Right hip fracture Mechanical fall at home -orthopedics following for operative management -pain control -PT/OT  Chest pain -appears to be quite atypical and suggestive of musculoskeletal origin -trops negative and EKG non acute -Echo done with report pending -D dimer elevated, but likely related to recent injury -she is not hypoxic, tachycardic and pain is reproducible on palpation -non contrast CT chest done on admission did not show any acute bony or parenchymal abnormalities -Wells score for PE and DVT is very low -Suspect pain is musculoskeletal in origin and if echo is unrevealing, would not pursue further work up. Will need further pain management  Chronic b/l LE edema -appears to have stasis changes bilaterally -chronically takes lasix -echo ordered  Leukocytosis -suspect stress response due to hip fracture -procalcitonin negative -no other signs of infection -continue to monitor  Moderate persistent asthma -no wheezing at this time -continue bronchodilators  Hypothyroidism -continue on synthroid  Gout -continue allopurinol   DVT prophylaxis: to begin after surgery per ortho  Code Status: DNR Family Communication: discussed with patient Disposition Plan: Status is: Inpatient Remains inpatient appropriate  because: operative management of hip fracture, may need placement after surgery     Consultants:  ortho  Procedures:  echo  Antimicrobials:      Subjective: She has been receiving pain medications so she feels pain in hip is better controlled. She denies any shortness of breath. She says she has had chest pain for the past 8-9 days, which began when she had reached back to hook her bra strap and she felt something pop in her sternum.  Objective: Vitals:   09/09/22 0115 09/09/22 0312 09/09/22 0541 09/09/22 0957  BP: (!) 182/86 (!) 165/86 130/75 (!) 173/96  Pulse: 94 98 92 91  Resp: 16 13 20 18   Temp: 98 F (36.7 C) 97.8 F (36.6 C) (!) 97.5 F (36.4 C) 98 F (36.7 C)  TempSrc:    Oral  SpO2: 95% 94% 94% 97%  Weight:      Height:       No intake or output data in the 24 hours ending 09/09/22 1018 Filed Weights   09/08/22 1912  Weight: 89.8 kg    Examination:  General exam: Appears calm and comfortable  Respiratory system: Clear to auscultation. Respiratory effort normal. Cardiovascular system: S1 & S2 heard, RRR. No JVD, murmurs, rubs, gallops or clicks.  Gastrointestinal system: Abdomen is nondistended, soft and nontender. No organomegaly or masses felt. Normal bowel sounds heard. Central nervous system: Alert and oriented. No focal neurological deficits. Extremities: bilateral chronic edema with stasis changes Skin: No rashes, lesions or ulcers Psychiatry: Judgement and insight appear normal. Mood & affect appropriate.     Data Reviewed: I have personally reviewed following labs and imaging studies  CBC: Recent Labs  Lab 09/08/22 2030 09/09/22 0353  WBC 25.3* 18.8*  NEUTROABS  --  15.8*  HGB 13.4 12.8  HCT 40.9 39.2  MCV 100.5* 102.6*  PLT 323 283   Basic Metabolic Panel: Recent Labs  Lab 09/08/22 2030  NA 137  K 3.6  CL 103  CO2 23  GLUCOSE 174*  BUN 15  CREATININE 0.92  CALCIUM 9.2   GFR: Estimated Creatinine Clearance: 55.9 mL/min  (by C-G formula based on SCr of 0.92 mg/dL). Liver Function Tests: No results for input(s): "AST", "ALT", "ALKPHOS", "BILITOT", "PROT", "ALBUMIN" in the last 168 hours. No results for input(s): "LIPASE", "AMYLASE" in the last 168 hours. No results for input(s): "AMMONIA" in the last 168 hours. Coagulation Profile: No results for input(s): "INR", "PROTIME" in the last 168 hours. Cardiac Enzymes: No results for input(s): "CKTOTAL", "CKMB", "CKMBINDEX", "TROPONINI" in the last 168 hours. BNP (last 3 results) No results for input(s): "PROBNP" in the last 8760 hours. HbA1C: No results for input(s): "HGBA1C" in the last 72 hours. CBG: No results for input(s): "GLUCAP" in the last 168 hours. Lipid Profile: No results for input(s): "CHOL", "HDL", "LDLCALC", "TRIG", "CHOLHDL", "LDLDIRECT" in the last 72 hours. Thyroid Function Tests: Recent Labs    09/09/22 0353  TSH 3.157   Anemia Panel: No results for input(s): "VITAMINB12", "FOLATE", "FERRITIN", "TIBC", "IRON", "RETICCTPCT" in the last 72 hours. Sepsis Labs: Recent Labs  Lab 09/09/22 0353  PROCALCITON <0.10    No results found for this or any previous visit (from the past 240 hour(s)).       Radiology Studies: DG Shoulder Right  Result Date: 09/08/2022 CLINICAL DATA:  Pain and limited movement after a fall. EXAM: RIGHT SHOULDER - 2+ VIEW COMPARISON:  None Available. FINDINGS: No acute fracture or dislocation demonstrated in the right shoulder. No focal bone lesion or bone destruction. Soft tissues are unremarkable. IMPRESSION: No acute bony abnormalities are demonstrated. Electronically Signed   By: Burman Nieves M.D.   On: 09/08/2022 21:16   DG Knee Complete 4 Views Right  Result Date: 09/08/2022 CLINICAL DATA:  Pain after a fall. EXAM: RIGHT KNEE - COMPLETE 4+ VIEW COMPARISON:  None Available. FINDINGS: Evaluation is limited due to nonstandard positioning. As visualized, no acute displaced fractures are identified. No  focal bone lesions. No significant effusion. IMPRESSION: Technically limited study due to nonstandard positioning. No acute displaced fractures are visualized. Electronically Signed   By: Burman Nieves M.D.   On: 09/08/2022 21:14   DG Hip Unilat W or Wo Pelvis 2-3 Views Right  Result Date: 09/08/2022 CLINICAL DATA:  Pain and deformity after a fall. EXAM: DG HIP (WITH OR WITHOUT PELVIS) 2-3V RIGHT COMPARISON:  None Available. FINDINGS: Comminuted inter trochanteric fractures of the proximal right femur with medial displacement and impaction of the distal fracture fragments resulting in varus angulation. Degenerative changes in the right hip. No dislocation of the hip joint. Degenerative changes in the lower lumbar spine and left hip. SI joints and symphysis pubis are not displaced. IMPRESSION: Comminuted inter trochanteric fractures of the proximal right femur with varus angulation. Electronically Signed   By: Burman Nieves M.D.   On: 09/08/2022 21:14   CT Chest Wo Contrast  Result Date: 09/08/2022 CLINICAL DATA:  Chest trauma, blunt.  Fall EXAM: CT CHEST WITHOUT CONTRAST TECHNIQUE: Multidetector CT imaging of the chest was performed following the standard protocol without IV contrast. RADIATION DOSE REDUCTION: This exam was performed according to the departmental dose-optimization program which includes automated exposure control, adjustment of the mA and/or kV  according to patient size and/or use of iterative reconstruction technique. COMPARISON:  None Available. FINDINGS: Cardiovascular: Mild coronary artery calcification. Global cardiac size within normal limits. No pericardial effusion. Central pulmonary arteries are enlarged in keeping with changes of pulmonary arterial hypertension. Moderate atherosclerotic calcification within the thoracic aorta. No aortic aneurysm. Mediastinum/Nodes: No pathologic thoracic adenopathy. Esophagus unremarkable. Lungs/Pleura: Lungs are clear. No pleural effusion  or pneumothorax. Upper Abdomen: No acute abnormality.  Status post cholecystectomy Musculoskeletal: Remote compression deformities of T11 and T12 are noted. Healed right fifth rib and scapular fracture formed ease are noted. Osseous structures are otherwise age-appropriate. No acute bone abnormality. No lytic or blastic bone lesion IMPRESSION: 1. No acute intrathoracic pathology identified. 2. Mild coronary artery calcification. 3. Enlarged central pulmonary arteries in keeping with changes of pulmonary arterial hypertension. Electronically Signed   By: Helyn Numbers M.D.   On: 09/08/2022 20:54        Scheduled Meds:  chlorhexidine  60 mL Topical Once   povidone-iodine  2 Application Topical Once   Continuous Infusions:   ceFAZolin (ANCEF) IV     lactated ringers 75 mL/hr at 09/09/22 0813   methocarbamol (ROBAXIN) IV     tranexamic acid       LOS: 1 day    Time spent:    Erick Blinks, MD [facility]   If 7PM-7AM, please contact night-coverage [email or web address]  09/09/2022, 10:18 AM

## 2022-09-09 NOTE — Op Note (Signed)
OPERATIVE REPORT  SURGEON: Samson Frederic, MD   ASSISTANT: Clint Bolder, PA-C.  PREOPERATIVE DIAGNOSIS: Right reverse obliquity peritrochanteric femur fracture.   POSTOPERATIVE DIAGNOSIS: Right reverse obliquity peritrochanteric femur fracture.   PROCEDURE: Intramedullary fixation, Right femur.   IMPLANTS: Biomet Affixus Hip Fracture Nail, 11 by 380 mm, 125 degrees. 10.5 x 100 mm Hip Fracture Nail Lag Screw. 5 x 52 mm distal interlocking screw 1.  ANESTHESIA:  General  ESTIMATED BLOOD LOSS:-100 mL    ANTIBIOTICS: 2 g Ancef.  DRAINS: None.  COMPLICATIONS: None.   CONDITION: PACU - hemodynamically stable.   BRIEF CLINICAL NOTE: Elaine Byrd is a 76 y.o. female who presented with a comminuted reverse obliquity peritrochanteric femur fracture. The patient was admitted to the hospitalist service and underwent perioperative risk stratification and medical optimization. The risks, benefits, and alternatives to the procedure were explained, and the patient elected to proceed.  PROCEDURE IN DETAIL: Surgical site was marked by myself. The patient was taken to the operating room and anesthesia was induced on the bed. The patient was then transferred to the [facility] table and the nonoperative lower extremity was scissored underneath the operative side. The fracture was reduced with traction, internal rotation, and adduction. The hip was prepped and draped in the normal sterile surgical fashion. Timeout was called verifying side and site of surgery. Preop antibiotics were given with 60 minutes of beginning the procedure.  Fluoroscopy was used to define the patient's anatomy. A 4 cm incision was made just proximal to the tip of the greater trochanter. The awl was used to obtain the standard starting point for a trochanteric entry nail under fluoroscopic control. The guidepin was placed. The entry reamer was used to open the proximal femur.  I placed the guidewire to the level of the physeal scar of  the knee. I measured the length of the guidewire. Sequential reaming was performed up to a size 12.5 mm with excellent chatter. Therefore, a size 11 by 380 mm nail was selected and assembled to the jig on the back table. The nail was placed without any difficulty. Through a separate stab incision, the cannula was placed down to the bone in preparation for the cephalomedullary device. A guidepin was placed into the femoral head using AP and lateral fluoroscopy views. The pin was measured, and then reaming was performed to the appropriate depth. The lag screw was inserted to the appropriate depth. The fracture was compressed through the jig. The setscrew was tightened. Using perfect circle technique, a distal interlocking screw was placed. The screw did not achieve any bony purchase despite perfect positioning on fluoroscopic views. I removed the screw. Using perfect circle technique, the most distal interlocking screw was placed. The jig was removed. Final AP and lateral fluoroscopy views were obtained to confirm fracture reduction and hardware placement. Tip apex distance was appropriate. There was no chondral penetration.  The wounds were copiously irrigated with saline. The wound was closed in layers with #1 Vicryl for the fascia, 2-0 Monocryl for the deep dermal layer, and skin staples. Dermabond was applied to the skin. Once the glue was fully hardened, sterile dressing was applied. The patient was then awakened from anesthesia and taken to the PACU in stable condition. Sponge needle and instrument counts were correct at the end of the case 2. There were no known complications.  We will readmit the patient to the hospitalist. Weightbearing status will be touchdown weightbearing with a walker. We will begin Lovenox for DVT prophylaxis. The patient  will work with physical therapy and undergo disposition planning.  Please note that a surgical assistant was a medical necessity for this procedure to perform it  in a safe and expeditious manner. Assistant was necessary to provide appropriate retraction of vital neurovascular structures, to prevent femoral fracture, and to allow for anatomic placement of the prosthesis.

## 2022-09-09 NOTE — Anesthesia Postprocedure Evaluation (Signed)
Anesthesia Post Note  Patient: Elaine Byrd  Procedure(s) Performed: INTRAMEDULLARY (IM) NAIL FEMORAL (Right: Hip)     Patient location during evaluation: PACU Anesthesia Type: General Level of consciousness: awake and alert Pain management: pain level controlled Vital Signs Assessment: post-procedure vital signs reviewed and stable Respiratory status: spontaneous breathing, nonlabored ventilation, respiratory function stable and patient connected to nasal cannula oxygen Cardiovascular status: blood pressure returned to baseline and stable Postop Assessment: no apparent nausea or vomiting Anesthetic complications: no   No notable events documented.  Last Vitals:  Vitals:   09/09/22 1645 09/09/22 1700  BP: (!) 174/85 (!) 174/83  Pulse: 98 90  Resp: 13 11  Temp:    SpO2: (!) 86% 100%    Last Pain:  Vitals:   09/09/22 1613  TempSrc:   PainSc: 0-No pain                 Kourtney Montesinos,W. EDMOND

## 2022-09-09 NOTE — Progress Notes (Signed)
Pt has arrived back to 1330 from PACU s/p right femur IM nailing.  Pt remains alert and oriented.  Dinner ordered for pt and call bell placed at bedside.

## 2022-09-09 NOTE — Progress Notes (Signed)
Contacted Echo to see if patients Echo could be completed earlier before surgery today.  Per anesthesia Echo will need to be completed and read before proceeding with surgery today.  Jasmine Pang BSN, Academic librarian - Perioperative Services [place] [phone]

## 2022-09-09 NOTE — Transfer of Care (Signed)
Immediate Anesthesia Transfer of Care Note  Patient: Elaine Byrd  Procedure(s) Performed: INTRAMEDULLARY (IM) NAIL FEMORAL (Right: Hip)  Patient Location: PACU  Anesthesia Type:General  Level of Consciousness: drowsy  Airway & Oxygen Therapy: Patient Spontanous Breathing and Patient connected to face mask oxygen  Post-op Assessment: Report given to RN and Post -op Vital signs reviewed and stable  Post vital signs: Reviewed and stable  Last Vitals:  Vitals Value Taken Time  BP 181/85 09/09/22 1613  Temp    Pulse 96 09/09/22 1613  Resp 14 09/09/22 1613  SpO2 100 % 09/09/22 1613  Vitals shown include unvalidated device data.  Last Pain:  Vitals:   09/09/22 0957  TempSrc: Oral  PainSc:       Patients Stated Pain Goal: 3 (96/29/52 8413)  Complications: No notable events documented.

## 2022-09-09 NOTE — Progress Notes (Signed)
Initial Nutrition Assessment  DOCUMENTATION CODES:   Obesity unspecified  INTERVENTION:  -Advance diet to regular as clinically appropriate -Provide ONS if average meal acceptance <75% -Provide diet education for healing as appropriate  NUTRITION DIAGNOSIS:  Increased nutrient needs related to hip fracture as evidenced by other (comment) (increased metabolic need for healing).  GOAL:  Patient will meet greater than or equal to 90% of their needs  MONITOR:  PO intake, Diet advancement  REASON FOR ASSESSMENT:  Consult Assessment of nutrition requirement/status  ASSESSMENT:  Pt is a 76yo F with PMH of asthma, allergic rhinitis, atopic dermatitis, GERD, depression, hypothyroidism, HTN, neuropathy, and gout who presents with right hip pain after a mechanical fall.  Attempted visit with pt at bedside this AM but was with other staff and now in surgery. Defer NFPE until after surgery. Weight history in EMR reviewed and stable. Nutrition related lab values within acceptable limits. Anticipate diet advancement following surgery. Monitor intake and provide ONS as needed. Will discuss adequate nutrition for healing with pt when available.   Medications reviewed and include: LR @75mL /hr, prn dilaudid  Labs reviewed: BG:174, likely stress response and not fasting. If BG remains high, consider checking A1c  NUTRITION - FOCUSED PHYSICAL EXAM: Complete after surgery  Diet Order:   Diet Order             Diet NPO time specified Except for: Sips with Meds  Diet effective midnight                   EDUCATION NEEDS:   Not appropriate for education at this time  Skin:  Skin Assessment: Reviewed RN Assessment  Last BM:  10/11  Height:  Ht Readings from Last 1 Encounters:  09/08/22 5\' 2"  (1.575 m)   Weight:  Wt Readings from Last 1 Encounters:  09/08/22 89.8 kg    Ideal Body Weight:  50 kg  BMI:  Body mass index is 36.21 kg/m.  Estimated Nutritional Needs:   Kcal:   1600-1975kcal  Protein:  75-100g  Fluid:  >1639mL  Candise Bowens, MS, RD, LDN, CNSC See AMi[facility] for contact information

## 2022-09-09 NOTE — Plan of Care (Signed)

## 2022-09-09 NOTE — Interval H&P Note (Signed)
History and Physical Interval Note:  09/09/2022 1:20 PM  Elaine Byrd  has presented today for surgery, with the diagnosis of right intertrochanteric fracture.  The various methods of treatment have been discussed with the patient and family. After consideration of risks, benefits and other options for treatment, the patient has consented to  Procedure(s): INTRAMEDULLARY (IM) NAIL FEMORAL (Right) as a surgical intervention.  The patient's history has been reviewed, patient examined, no change in status, stable for surgery.  I have reviewed the patient's chart and labs.  Questions were answered to the patient's satisfaction.    The risks, benefits, and alternatives were discussed with the patient. There are risks associated with the surgery including, but not limited to, problems with anesthesia (death), infection, differences in leg length/angulation/rotation, fracture of bones, loosening or failure of implants, malunion, nonunion, hematoma (blood accumulation) which may require surgical drainage, blood clots, pulmonary embolism, nerve injury (foot drop), and blood vessel injury. The patient understands these risks and elects to proceed.    Hilton Cork Latitia Housewright

## 2022-09-09 NOTE — Anesthesia Preprocedure Evaluation (Addendum)
Anesthesia Evaluation  Patient identified by MRN, date of birth, ID band Patient awake    Reviewed: Allergy & Precautions, H&P , NPO status , Patient's Chart, lab work & pertinent test results  Airway Mallampati: III  TM Distance: >3 FB Neck ROM: Full    Dental no notable dental hx. (+) Teeth Intact, Dental Advisory Given   Pulmonary asthma , former smoker,    Pulmonary exam normal breath sounds clear to auscultation       Cardiovascular hypertension,  Rhythm:Regular Rate:Normal     Neuro/Psych Anxiety Depression negative neurological ROS     GI/Hepatic Neg liver ROS, GERD  Medicated and Controlled,  Endo/Other  Hypothyroidism Morbid obesity  Renal/GU negative Renal ROS  negative genitourinary   Musculoskeletal  (+) Arthritis , Osteoarthritis,    Abdominal   Peds  Hematology negative hematology ROS (+)   Anesthesia Other Findings   Reproductive/Obstetrics negative OB ROS                            Anesthesia Physical Anesthesia Plan  ASA: 3  Anesthesia Plan: General   Post-op Pain Management: Tylenol PO (pre-op)*   Induction: Intravenous  PONV Risk Score and Plan: 4 or greater and Ondansetron, Dexamethasone and Treatment may vary due to age or medical condition  Airway Management Planned: Oral ETT  Additional Equipment:   Intra-op Plan:   Post-operative Plan: Extubation in OR  Informed Consent: I have reviewed the patients History and Physical, chart, labs and discussed the procedure including the risks, benefits and alternatives for the proposed anesthesia with the patient or authorized representative who has indicated his/her understanding and acceptance.   Patient has DNR.  Discussed DNR with patient and Suspend DNR.   Dental advisory given  Plan Discussed with: CRNA  Anesthesia Plan Comments:        Anesthesia Quick Evaluation

## 2022-09-09 NOTE — Progress Notes (Signed)
Echocardiogram 2D Echocardiogram has been performed.  Oneal Deputy Roby Spalla RDCS 09/09/2022, 9:48 AM   Notified Dr. Oval Linsey for expedited report

## 2022-09-10 ENCOUNTER — Encounter (HOSPITAL_COMMUNITY): Payer: Self-pay | Admitting: Orthopedic Surgery

## 2022-09-10 DIAGNOSIS — S72001A Fracture of unspecified part of neck of right femur, initial encounter for closed fracture: Secondary | ICD-10-CM | POA: Diagnosis not present

## 2022-09-10 DIAGNOSIS — R600 Localized edema: Secondary | ICD-10-CM | POA: Diagnosis not present

## 2022-09-10 DIAGNOSIS — R079 Chest pain, unspecified: Secondary | ICD-10-CM | POA: Diagnosis not present

## 2022-09-10 DIAGNOSIS — I10 Essential (primary) hypertension: Secondary | ICD-10-CM | POA: Diagnosis not present

## 2022-09-10 LAB — CBC
HCT: 31.1 % — ABNORMAL LOW (ref 36.0–46.0)
Hemoglobin: 9.9 g/dL — ABNORMAL LOW (ref 12.0–15.0)
MCH: 33 pg (ref 26.0–34.0)
MCHC: 31.8 g/dL (ref 30.0–36.0)
MCV: 103.7 fL — ABNORMAL HIGH (ref 80.0–100.0)
Platelets: 209 10*3/uL (ref 150–400)
RBC: 3 MIL/uL — ABNORMAL LOW (ref 3.87–5.11)
RDW: 13.9 % (ref 11.5–15.5)
WBC: 14.7 10*3/uL — ABNORMAL HIGH (ref 4.0–10.5)
nRBC: 0 % (ref 0.0–0.2)

## 2022-09-10 LAB — BASIC METABOLIC PANEL
Anion gap: 7 (ref 5–15)
BUN: 16 mg/dL (ref 8–23)
CO2: 26 mmol/L (ref 22–32)
Calcium: 8.3 mg/dL — ABNORMAL LOW (ref 8.9–10.3)
Chloride: 103 mmol/L (ref 98–111)
Creatinine, Ser: 0.81 mg/dL (ref 0.44–1.00)
GFR, Estimated: >60 mL/min (ref >60)
Glucose, Bld: 137 mg/dL — ABNORMAL HIGH (ref 70–99)
Potassium: 4.1 mmol/L (ref 3.5–5.1)
Sodium: 136 mmol/L (ref 135–145)

## 2022-09-10 MED ORDER — ASPIRIN 81 MG PO CHEW: 81.0000 mg | CHEWABLE_TABLET | Freq: Two times a day (BID) | ORAL | 0 refills | Status: AC

## 2022-09-10 MED ORDER — HYDROCODONE-ACETAMINOPHEN 10-325 MG PO TABS: 0.5000 | ORAL_TABLET | ORAL | 0 refills | Status: AC | PRN

## 2022-09-10 MED ORDER — SODIUM CHLORIDE 0.9 % IV SOLN: INTRAVENOUS | Status: DC | PRN

## 2022-09-10 NOTE — Plan of Care (Signed)
  Problem: Education: Goal: Knowledge of General Education information will improve Description Including pain rating scale, medication(s)/side effects and non-pharmacologic comfort measures Outcome: Progressing   

## 2022-09-10 NOTE — TOC PASRR Note (Signed)
Transition of Care (TOC) -30 day Note       Patient Details  Name: Elaine Byrd MRN:  [id number] Date of Birth:  [birth year]   Transition of Care [facility]) CM/SW Contact  Name:  Lennart Pall, [place] Phone Number:  [phone] Date:  09/10/2022 Time:  8756   MUST ID: [id number]   To Whom it May Concern:   Please be advised that the above patient will require a short-term nursing home stay, anticipated 30 days or less rehabilitation and strengthening. The plan is for return home.

## 2022-09-10 NOTE — Progress Notes (Signed)
PROGRESS NOTE    Elaine Byrd  [id number] DOB: 09/29/1946 DOA: 09/08/2022 PCP: Karleen Hampshire., MD    Brief Narrative:  76 y/o female admitted to the hospital after mechanical fall resulting in right hip fracture. She also complains of chest pain that appears to musculoskeletal in origin. Ortho following for operative management    Assessment & Plan:   Principal Problem:   Hip fracture (HCC) Active Problems:   Depression   Hypothyroidism   GERD (gastroesophageal reflux disease)   Essential hypertension   GAD (generalized anxiety disorder)   Moderate persistent asthma   Chest pain   Leukocytosis   Bilateral lower extremity edema   Right hip fracture Mechanical fall at home -orthopedics following for operative management -pain control -PT/OT with recommendations for skilled nursing facility  Chest pain -appears to be quite atypical and suggestive of musculoskeletal origin -trops negative and EKG non acute -Echo done with report pending -D dimer elevated, but likely related to recent injury -she is not hypoxic, tachycardic and pain is reproducible on palpation -non contrast CT chest done on admission did not show any acute bony or parenchymal abnormalities -Wells score for PE and DVT is very low -Echocardiogram shows normal EF and normal RV function -Suspect pain is musculoskeletal in origin.  Will need further pain management  Chronic b/l LE edema -appears to have stasis changes bilaterally -chronically takes lasix and Aldactone -echo with normal EF  Leukocytosis -suspect stress response due to hip fracture -procalcitonin negative -no other signs of infection -Trending down -continue to monitor  Moderate persistent asthma -no wheezing at this time -continue bronchodilators, inhaled steroids  Hypothyroidism -continue on synthroid  Gout -continue allopurinol   DVT prophylaxis: enoxaparin (LOVENOX) injection 40 mg Start: 09/10/22 0800 SCDs Start:  09/09/22 1809to begin after surgery per ortho  Code Status: DNR Family Communication: discussed with patient Disposition Plan: Status is: Inpatient Remains inpatient appropriate because: operative management of hip fracture, may need placement after surgery     Consultants:  ortho  Procedures:  Echo Intramedullary fixation, Right femur.  Antimicrobials:      Subjective: Patient is lying in bed.  She has been working with physical therapy, but has severe pain when she tried to sit up on the side of the bed.  She says that overall pain is better with pain medicines.  Objective: Vitals:   09/09/22 2006 09/09/22 2356 09/10/22 0544 09/10/22 1009  BP: (!) 159/89 (!) 140/69 (!) 150/74 (!) 157/79  Pulse: 99 97 91 99  Resp: 16 15 18    Temp: 98.1 F (36.7 C) 98.3 F (36.8 C) 98.6 F (37 C)   TempSrc:  Oral Oral   SpO2: 100% 97% 99%   Weight:      Height:        Intake/Output Summary (Last 24 hours) at 09/10/2022 1315 Last data filed at 09/10/2022 1029 Gross per 24 hour  Intake 2620 ml  Output 850 ml  Net 1770 ml   Filed Weights   09/08/22 1912  Weight: 89.8 kg    Examination:  General exam: Appears calm and comfortable  Respiratory system: Clear to auscultation. Respiratory effort normal. Cardiovascular system: S1 & S2 heard, RRR. No JVD, murmurs, rubs, gallops or clicks.  Gastrointestinal system: Abdomen is nondistended, soft and nontender. No organomegaly or masses felt. Normal bowel sounds heard. Central nervous system: Alert and oriented. No focal neurological deficits. Extremities: bilateral chronic edema with stasis changes Skin: No rashes, lesions or ulcers Psychiatry: Judgement and insight  appear normal. Mood & affect appropriate.     Data Reviewed: I have personally reviewed following labs and imaging studies  CBC: Recent Labs  Lab 09/08/22 2030 09/09/22 0353 09/10/22 0325  WBC 25.3* 18.8* 14.7*  NEUTROABS  --  15.8*  --   HGB 13.4 12.8 9.9*   HCT 40.9 39.2 31.1*  MCV 100.5* 102.6* 103.7*  PLT 323 283 [id number]   Basic Metabolic Panel: Recent Labs  Lab 09/08/22 2030 09/10/22 0325  NA 137 136  K 3.6 4.1  CL 103 103  CO2 23 26  GLUCOSE 174* 137*  BUN 15 16  CREATININE 0.92 0.81  CALCIUM 9.2 8.3*   GFR: Estimated Creatinine Clearance: 63.5 mL/min (by C-G formula based on SCr of 0.81 mg/dL). Liver Function Tests: No results for input(s): "AST", "ALT", "ALKPHOS", "BILITOT", "PROT", "ALBUMIN" in the last 168 hours. No results for input(s): "LIPASE", "AMYLASE" in the last 168 hours. No results for input(s): "AMMONIA" in the last 168 hours. Coagulation Profile: No results for input(s): "INR", "PROTIME" in the last 168 hours. Cardiac Enzymes: No results for input(s): "CKTOTAL", "CKMB", "CKMBINDEX", "TROPONINI" in the last 168 hours. BNP (last 3 results) No results for input(s): "PROBNP" in the last 8760 hours. HbA1C: No results for input(s): "HGBA1C" in the last 72 hours. CBG: No results for input(s): "GLUCAP" in the last 168 hours. Lipid Profile: No results for input(s): "CHOL", "HDL", "LDLCALC", "TRIG", "CHOLHDL", "LDLDIRECT" in the last 72 hours. Thyroid Function Tests: Recent Labs    09/09/22 0353  TSH 3.157   Anemia Panel: No results for input(s): "VITAMINB12", "FOLATE", "FERRITIN", "TIBC", "IRON", "RETICCTPCT" in the last 72 hours. Sepsis Labs: Recent Labs  Lab 09/09/22 0353  PROCALCITON <0.10    Recent Results (from the past 240 hour(s))  MRSA Next Gen by PCR, Nasal     Status: None   Collection Time: 09/09/22  9:37 AM   Specimen: Nasal Mucosa; Nasal Swab  Result Value Ref Range Status   MRSA by PCR Next Gen NOT DETECTED NOT DETECTED Final    Comment: (NOTE) The GeneXpert MRSA Assay (FDA approved for NASAL specimens only), is one component of a comprehensive MRSA colonization surveillance program. It is not intended to diagnose MRSA infection nor to guide or monitor treatment for MRSA  infections. Test performance is not FDA approved in patients less than 33 years old. Performed at [facility], [place] [address] [place], [place] [address]          Radiology Studies: Pelvis Portable  Result Date: 09/09/2022 CLINICAL DATA:  Closed. Trochanteric fracture of femur. Post right IM nail. EXAM: PORTABLE PELVIS 1-2 VIEWS COMPARISON:  09/08/2022 FINDINGS: Postoperative changes with interval intramedullary rod and compression screw fixation of an inter trochanteric fracture of the proximal right femur. Inferior aspect of the intramedullary rod is not included within the field of view. Fracture lines remain visible. Soft tissue gas consistent with recent surgery. Mild valgus orientation is suggested. No dislocation of the hip joint. Degenerative changes in the hips and lower lumbar spine. IMPRESSION: Postoperative internal fixation of inter trochanteric right hip fracture. Electronically Signed   By: Lucienne Capers M.D.   On: 09/09/2022 19:04   DG FEMUR PORT, MIN 2 VIEWS RIGHT  Result Date: 09/09/2022 CLINICAL DATA:  Closed peritrochanteric fracture of femur, right, initial encounter. Status post intramedullary nail. EXAM: RIGHT FEMUR PORTABLE 2 VIEW COMPARISON:  Right hip radiographs 09/08/2022 FINDINGS: Interval long cephalomedullary nail fixation of the previously seen distal right intertrochanteric fracture. There  is improved, near anatomic alignment. No perihardware lucency is seen to indicate hardware failure or loosening. Severe right femoroacetabular joint space narrowing. Diffuse decreased bone mineralization. IMPRESSION: Interval long cephalomedullary nail fixation of the previously seen distal right intertrochanteric fracture with improved, near anatomic alignment. Electronically Signed   By: Yvonne Kendall M.D.   On: 09/09/2022 17:15   DG FEMUR, MIN 2 VIEWS RIGHT  Result Date: 09/09/2022 CLINICAL DATA:  Fluoroscopic assistance for internal fixation of  fracture of right femur EXAM: RIGHT FEMUR 2 VIEWS COMPARISON:  09/08/2022 FINDINGS: Fluoroscopic images show reduction and internal fixation of fracture of proximal shaft of right femur with intramedullary rod. Fluoroscopic time 1 minutes and 29 seconds. Radiation dose 14.62 mGy. IMPRESSION: Fluoroscopic assistance was provided for reduction and internal fixation of fracture of proximal right femur. Electronically Signed   By: Elmer Picker M.D.   On: 09/09/2022 15:32   DG C-Arm 1-60 Min-No Report  Result Date: 09/09/2022 Fluoroscopy was utilized by the requesting physician.  No radiographic interpretation.   DG C-Arm 1-60 Min-No Report  Result Date: 09/09/2022 Fluoroscopy was utilized by the requesting physician.  No radiographic interpretation.   ECHOCARDIOGRAM COMPLETE  Result Date: 09/09/2022    ECHOCARDIOGRAM REPORT   Patient Name:   TASMIA BLUMER Date of Exam: 09/09/2022 Medical Rec #:  [id number]    Height:       62.0 in Accession #:    [id number]   Weight:       198.0 lb Date of Birth:  26-Sep-1946    BSA:          1.904 m Patient Age:    20 years     BP:           130/75 mmHg Patient Gender: F            HR:           76 bpm. Exam Location:  Inpatient Procedure: 2D Echo, Color Doppler and Cardiac Doppler Indications:    R07.9* Chest pain, unspecified, Pre-op evaluation  History:        Patient has no prior history of Echocardiogram examinations.  Sonographer:    Raquel Sarna Senior RDCS Referring Phys: [id number] [place]  1. Left ventricular ejection fraction, by estimation, is 60 to 65%. The left ventricle has normal function. The left ventricle has no regional wall motion abnormalities. Left ventricular diastolic parameters are consistent with Grade I diastolic dysfunction (impaired relaxation).  2. Right ventricular systolic function is normal. The right ventricular size is normal. There is normal pulmonary artery systolic pressure.  3. The mitral valve is normal in  structure. Trivial mitral valve regurgitation. No evidence of mitral stenosis.  4. The aortic valve is tricuspid. There is moderate calcification of the aortic valve. Aortic valve regurgitation is not visualized. No aortic stenosis is present.  5. Aortic dilatation noted. There is mild dilatation of the aortic root, measuring 37 mm.  6. The inferior vena cava is normal in size with greater than 50% respiratory variability, suggesting right atrial pressure of 3 mmHg. FINDINGS  Left Ventricle: Left ventricular ejection fraction, by estimation, is 60 to 65%. The left ventricle has normal function. The left ventricle has no regional wall motion abnormalities. The left ventricular internal cavity size was normal in size. There is  no left ventricular hypertrophy. Left ventricular diastolic parameters are consistent with Grade I diastolic dysfunction (impaired relaxation). Indeterminate filling pressures. Right Ventricle: The right ventricular size is normal. No increase in right  ventricular wall thickness. Right ventricular systolic function is normal. There is normal pulmonary artery systolic pressure. The tricuspid regurgitant velocity is 2.57 m/s, and  with an assumed right atrial pressure of 3 mmHg, the estimated right ventricular systolic pressure is [id number]. Left Atrium: Left atrial size was normal in size. Right Atrium: Right atrial size was normal in size. Pericardium: There is no evidence of pericardial effusion. Mitral Valve: The mitral valve is normal in structure. Trivial mitral valve regurgitation. No evidence of mitral valve stenosis. Tricuspid Valve: The tricuspid valve is normal in structure. Tricuspid valve regurgitation is trivial. No evidence of tricuspid stenosis. Aortic Valve: The aortic valve is tricuspid. There is moderate calcification of the aortic valve. Aortic valve regurgitation is not visualized. No aortic stenosis is present. Pulmonic Valve: The pulmonic valve was normal in structure.  Pulmonic valve regurgitation is not visualized. No evidence of pulmonic stenosis. Aorta: Aortic dilatation noted. There is mild dilatation of the aortic root, measuring 37 mm. Venous: The inferior vena cava is normal in size with greater than 50% respiratory variability, suggesting right atrial pressure of 3 mmHg. IAS/Shunts: No atrial level shunt detected by color flow Doppler.  LEFT VENTRICLE PLAX 2D LVIDd:         4.50 cm   Diastology LVIDs:         2.90 cm   LV e' medial:    6.85 cm/s LV PW:         0.90 cm   LV E/e' medial:  13.2 LV IVS:        0.80 cm   LV e' lateral:   10.30 cm/s LVOT diam:     1.90 cm   LV E/e' lateral: 8.8 LV SV:         59 LV SV Index:   31 LVOT Area:     2.84 cm  RIGHT VENTRICLE RV S prime:     10.80 cm/s TAPSE (M-mode): 1.8 cm LEFT ATRIUM             Index        RIGHT ATRIUM           Index LA diam:        2.60 cm 1.37 cm/m   RA Area:     15.70 cm LA Vol (A2C):   33.1 ml 17.39 ml/m  RA Volume:   38.50 ml  20.23 ml/m LA Vol (A4C):   43.5 ml 22.85 ml/m LA Biplane Vol: 40.4 ml 21.22 ml/m  AORTIC VALVE LVOT Vmax:   102.00 cm/s LVOT Vmean:  69.000 cm/s LVOT VTI:    0.208 m  AORTA Ao Root diam: 3.70 cm Ao Asc diam:  3.50 cm MITRAL VALVE                TRICUSPID VALVE MV Area (PHT): 3.45 cm     TR Peak grad:   26.4 mmHg MV Decel Time: 220 msec     TR Vmax:        257.00 cm/s MV E velocity: 90.40 cm/s MV A velocity: 103.00 cm/s  SHUNTS MV E/A ratio:  0.88         Systemic VTI:  0.21 m                             Systemic Diam: 1.90 cm Skeet Latch MD Electronically signed by Skeet Latch MD Signature Date/Time: 09/09/2022/10:57:10 AM    Final    DG  Shoulder Right  Result Date: 09/08/2022 CLINICAL DATA:  Pain and limited movement after a fall. EXAM: RIGHT SHOULDER - 2+ VIEW COMPARISON:  None Available. FINDINGS: No acute fracture or dislocation demonstrated in the right shoulder. No focal bone lesion or bone destruction. Soft tissues are unremarkable. IMPRESSION: No acute  bony abnormalities are demonstrated. Electronically Signed   By: Lucienne Capers M.D.   On: 09/08/2022 21:16   DG Knee Complete 4 Views Right  Result Date: 09/08/2022 CLINICAL DATA:  Pain after a fall. EXAM: RIGHT KNEE - COMPLETE 4+ VIEW COMPARISON:  None Available. FINDINGS: Evaluation is limited due to nonstandard positioning. As visualized, no acute displaced fractures are identified. No focal bone lesions. No significant effusion. IMPRESSION: Technically limited study due to nonstandard positioning. No acute displaced fractures are visualized. Electronically Signed   By: Lucienne Capers M.D.   On: 09/08/2022 21:14   DG Hip Unilat W or Wo Pelvis 2-3 Views Right  Result Date: 09/08/2022 CLINICAL DATA:  Pain and deformity after a fall. EXAM: DG HIP (WITH OR WITHOUT PELVIS) 2-3V RIGHT COMPARISON:  None Available. FINDINGS: Comminuted inter trochanteric fractures of the proximal right femur with medial displacement and impaction of the distal fracture fragments resulting in varus angulation. Degenerative changes in the right hip. No dislocation of the hip joint. Degenerative changes in the lower lumbar spine and left hip. SI joints and symphysis pubis are not displaced. IMPRESSION: Comminuted inter trochanteric fractures of the proximal right femur with varus angulation. Electronically Signed   By: Lucienne Capers M.D.   On: 09/08/2022 21:14   CT Chest Wo Contrast  Result Date: 09/08/2022 CLINICAL DATA:  Chest trauma, blunt.  Fall EXAM: CT CHEST WITHOUT CONTRAST TECHNIQUE: Multidetector CT imaging of the chest was performed following the standard protocol without IV contrast. RADIATION DOSE REDUCTION: This exam was performed according to the departmental dose-optimization program which includes automated exposure control, adjustment of the mA and/or kV according to patient size and/or use of iterative reconstruction technique. COMPARISON:  None Available. FINDINGS: Cardiovascular: Mild coronary  artery calcification. Global cardiac size within normal limits. No pericardial effusion. Central pulmonary arteries are enlarged in keeping with changes of pulmonary arterial hypertension. Moderate atherosclerotic calcification within the thoracic aorta. No aortic aneurysm. Mediastinum/Nodes: No pathologic thoracic adenopathy. Esophagus unremarkable. Lungs/Pleura: Lungs are clear. No pleural effusion or pneumothorax. Upper Abdomen: No acute abnormality.  Status post cholecystectomy Musculoskeletal: Remote compression deformities of T11 and T12 are noted. Healed right fifth rib and scapular fracture formed ease are noted. Osseous structures are otherwise age-appropriate. No acute bone abnormality. No lytic or blastic bone lesion IMPRESSION: 1. No acute intrathoracic pathology identified. 2. Mild coronary artery calcification. 3. Enlarged central pulmonary arteries in keeping with changes of pulmonary arterial hypertension. Electronically Signed   By: Fidela Salisbury M.D.   On: 09/08/2022 20:54        Scheduled Meds:  allopurinol  300 mg Oral Daily   docusate sodium  100 mg Oral BID   enoxaparin (LOVENOX) injection  40 mg Subcutaneous Q24H   fluticasone furoate-vilanterol  1 puff Inhalation Daily   And   umeclidinium bromide  1 puff Inhalation Daily   furosemide  80 mg Oral Daily   hydrOXYzine  25 mg Oral TID   levothyroxine  100 mcg Oral QAC breakfast   pantoprazole  40 mg Oral Daily   potassium chloride  10 mEq Oral BID   primidone  50 mg Oral BID   senna  1 tablet Oral BID  spironolactone  25 mg Oral Daily   tiZANidine  2 mg Oral TID   Continuous Infusions:  sodium chloride 10 mL/hr at 09/10/22 1028   methocarbamol (ROBAXIN) IV     methocarbamol (ROBAXIN) IV       LOS: 2 days    Time spent: 45mins    Kathie Dike, MD [facility]   If 7PM-7AM, please contact night-coverage [email or web address]  09/10/2022, 1:15 PM

## 2022-09-10 NOTE — Discharge Instructions (Signed)
Dr. Rod Can [facility] K[facility] Specialist [facility] [address], [place], [place] [address] [phone]   POSTOPERATIVE DIRECTIONS    Hip Rehabilitation, Guidelines Following Surgery   WEIGHT BEARING Other:  Touchdown weightbearing on right lower extremity with walker.    HOME CARE INSTRUCTIONS  Remove items at home which could result in a fall. This includes throw rugs or furniture in walking pathways.  Continue medications as instructed at time of discharge. You may have some home medications which will be placed on hold until you complete the course of blood thinner medication. 4 days after discharge, you may start showering. No tub baths or soaking your incisions. Do not put on socks or shoes without following the instructions of your caregivers.   Sit on chairs with arms. Use the chair arms to help push yourself up when arising.  Arrange for the use of a toilet seat elevator so you are not sitting low.  Walk with walker as instructed.  You may resume a sexual relationship in one month or when given the OK by your caregiver.  Use walker as long as suggested by your caregivers.  Avoid periods of inactivity such as sitting longer than an hour when not asleep. This helps prevent blood clots.  You may return to work once you are cleared by [employer], production.  Do not drive a car for 6 weeks or until released by your surgeon.  Do not drive while taking narcotics.  Wear elastic stockings for two weeks following surgery during the day but you may remove then at night.  Make sure you keep all of your appointments after your operation with all of your doctors and caregivers. You should call the office at the above phone number and make an appointment for approximately two weeks after the date of your surgery. Please pick up a stool softener and laxative for home use as long as you are requiring pain medications. ICE to the affected hip every three hours  for 30 minutes at a time and then as needed for pain and swelling. Continue to use ice on the hip for pain and swelling from surgery. You may notice swelling that will progress down to the foot and ankle.  This is normal after surgery.  Elevate the leg when you are not up walking on it.   It is important for you to complete the blood thinner medication as prescribed by your doctor. Continue to use the breathing machine which will help keep your temperature down.  It is common for your temperature to cycle up and down following surgery, especially at night when you are not up moving around and exerting yourself.  The breathing machine keeps your lungs expanded and your temperature down.  RANGE OF MOTION AND STRENGTHENING EXERCISES  These exercises are designed to help you keep full movement of your hip joint. Follow your caregiver's or physical therapist's instructions. Perform all exercises about fifteen times, three times per day or as directed. Exercise both hips, even if you have had only one joint replacement. These exercises can be done on a training (exercise) mat, on the floor, on a table or on a bed. Use whatever works the best and is most comfortable for you. Use music or television while you are exercising so that the exercises are a pleasant break in your day. This will make your life better with the exercises acting as a break in routine you can look forward to.  Lying on  your back, slowly slide your foot toward your buttocks, raising your knee up off the floor. Then slowly slide your foot back down until your leg is straight again.  Lying on your back spread your legs as far apart as you can without causing discomfort.  Lying on your side, raise your upper leg and foot straight up from the floor as far as is comfortable. Slowly lower the leg and repeat.  Lying on your back, tighten up the muscle in the front of your thigh (quadriceps muscles). You can do this by keeping your leg straight and  trying to raise your heel off the floor. This helps strengthen the largest muscle supporting your knee.  Lying on your back, tighten up the muscles of your buttocks both with the legs straight and with the knee bent at a comfortable angle while keeping your heel on the floor.   SKILLED REHAB INSTRUCTIONS: If the patient is transferred to a skilled rehab facility following release from the hospital, a list of the current medications will be sent to the facility for the patient to continue.  When discharged from the skilled rehab facility, please have the facility set up the patient's Home Health Physical Therapy prior to being released. Also, the skilled facility will be responsible for providing the patient with their medications at time of release from the facility to include their pain medication and their blood thinner medication. If the patient is still at the rehab facility at time of the two week follow up appointment, the skilled rehab facility will also need to assist the patient in arranging follow up appointment in our office and any transportation needs.  MAKE SURE YOU:  Understand these instructions.  Will watch your condition.  Will get help right away if you are not doing well or get worse.  Pick up stool softner and laxative for home use following surgery while on pain medications. Daily dry dressing changes as needed. In 4 days, you may remove your dressings and begin taking showers - no tub baths or soaking the incisions. Continue to use ice for pain and swelling after surgery. Do not use any lotions or creams on the incision until instructed by your surgeon.

## 2022-09-10 NOTE — Plan of Care (Signed)
?  Problem: Clinical Measurements: ?Goal: Will remain free from infection ?Outcome: Progressing ?  ?

## 2022-09-10 NOTE — Evaluation (Signed)
Occupational Therapy Evaluation Patient Details Name: Elaine Byrd MRN: [id number] DOB: [birth year] Today's Date: 09/10/2022   History of Present Illness Pt. is a 76 y/o female admitted after fall resulting in Right reverse obliquity peritrochanteric femur fracture and s/p R femur IM nail on 09/09/22   Clinical Impression   Elaine Byrd is a 76 y/o female with above medical history. Prior to hospitalization patient was mod I and required increased time, effort, and DME to complete ADL/IADLs tasks. 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 Patient now presents with pain in RLE , decreased mobility, decreased strength, and decreased activity tolerance, increased edema, and obesity. On evaluation patient c/o pain during any movement of RLE. Patient was unable to complete supine to sit with +2 assistance due to pain. During bed mobility patient stated "I am going to pass out" due to pain, and was returned to bed in supine. Therapist educated patient to increase breathing during therapy to relax and reduce pain. Patient had received pain medication prior to therapy. Patient requires significant assistance with ADLs in comparison to baseline due to pain. Patient will benefit from skilled OT services while in hospital to improve deficits and learn compensatory strategies as needed in order to return to PLOF.  Recommend SNF at discharge.     Recommendations for follow up therapy are one component of a multi-disciplinary discharge planning process, led by the attending physician.  Recommendations may be updated based on patient status, additional functional criteria and insurance authorization.   Follow Up Recommendations  Skilled nursing-short term rehab (<3 hours/day)    Assistance Recommended at Discharge Frequent or constant Supervision/Assistance  Patient can return home with the following A lot of help with bathing/dressing/bathroom;A lot of help with walking and/or transfers;Assistance with  cooking/housework;Help with stairs or ramp for entrance    Functional Status Assessment  Patient has had a recent decline in their functional status and demonstrates the ability to make significant improvements in function in a reasonable and predictable amount of time.  Equipment Recommendations  None recommended by OT    Recommendations for Other Services       Precautions / Restrictions Precautions Precautions: Fall Restrictions Weight Bearing Restrictions: Yes RLE Weight Bearing: Touchdown weight bearing      Mobility Bed Mobility Overal bed mobility: Needs Assistance Bed Mobility: Supine to Sit     Supine to sit: Total assist, +2 for physical assistance     General bed mobility comments: patient is unable to complete supine to sit due to pain.    Transfers                          Balance Overall balance assessment:  (unable to test due to pain)                                         ADL either performed or assessed with clinical judgement   ADL Overall ADL's : Needs assistance/impaired Eating/Feeding: Set up;Bed level   Grooming: Set up;Bed level   Upper Body Bathing: Bed level;Set up   Lower Body Bathing: Bed level;Maximal assistance   Upper Body Dressing : Set up;Bed level   Lower Body Dressing: Bed level;Total assistance   Toilet Transfer: Total assistance   Toileting- Clothing Manipulation and Hygiene: Total assistance         General ADL Comments:  patient unable to ambulate due to pain     Vision   Vision Assessment?: No apparent visual deficits     Perception     Praxis      Pertinent Vitals/Pain Pain Assessment Pain Assessment: 0-10 Pain Score: 10-Worst pain ever Pain Location: R hip with movement Pain Descriptors / Indicators: Discomfort, Throbbing, Guarding, Stabbing Pain Intervention(s): Limited activity within patient's tolerance, Premedicated before session     Hand Dominance Right    Extremity/Trunk Assessment Upper Extremity Assessment Upper Extremity Assessment: RUE deficits/detail;LUE deficits/detail RUE Deficits / Details: WFL-ROM RUE Sensation: WNL RUE Coordination: WNL LUE Deficits / Details: WFL-ROM LUE Sensation: WNL LUE Coordination: WNL   Lower Extremity Assessment Lower Extremity Assessment: Defer to PT       Communication Communication Communication: HOH   Cognition Arousal/Alertness: Awake/alert Behavior During Therapy: WFL for tasks assessed/performed Overall Cognitive Status: Within Functional Limits for tasks assessed                                       General Comments       Exercises     Shoulder Instructions      Home Living Family/patient expects to be discharged to:: Private residence Living Arrangements: Alone Available Help at Discharge: Available PRN/intermittently Type of Home: Apartment Home Access: Level entry     Home Layout: One level     Bathroom Shower/Tub: Teacher, early years/pre: Standard     Home Equipment: Toilet riser;Grab bars - tub/shower;Cane - single point;Shower seat;Rollator (4 wheels);Rolling Walker (2 wheels)          Prior Functioning/Environment Prior Level of Function : Independent/Modified Independent             Mobility Comments: uses rollator ADLs Comments: has only been taking sponge baths since last year due to edema and effects of long covid.        OT Problem List: Decreased strength;Decreased range of motion;Pain;Decreased activity tolerance;Obesity;Increased edema      OT Treatment/Interventions: Self-care/ADL training;Therapeutic exercise;Modalities;Therapeutic activities;DME and/or AE instruction;Patient/family education;Energy conservation    OT Goals(Current goals can be found in the care plan section) Acute Rehab OT Goals Patient Stated Goal: to get better OT Goal Formulation: With patient Time For Goal Achievement:  09/24/22 Potential to Achieve Goals: Good ADL Goals Pt Will Perform Lower Body Dressing: with mod assist;with adaptive equipment Pt Will Transfer to Toilet: bedside commode;with mod assist Additional ADL Goal #1: patient will complete grooming task at EOB as evidence of improving activity tolerance  OT Frequency: Min 2X/week    Co-evaluation              AM-PAC OT "6 Clicks" Daily Activity     Outcome Measure Help from another person eating meals?: A Little Help from another person taking care of personal grooming?: A Little Help from another person toileting, which includes using toliet, bedpan, or urinal?: Total Help from another person bathing (including washing, rinsing, drying)?: A Lot Help from another person to put on and taking off regular upper body clothing?: A Little Help from another person to put on and taking off regular lower body clothing?: Total 6 Click Score: 13   End of Session Nurse Communication:  (patient will need increased pain meds and SNF reccomendation)  Activity Tolerance: Patient limited by pain Patient left: in bed;with call bell/phone within reach;with bed alarm set  OT Visit Diagnosis: Muscle  weakness (generalized) (M62.81);Pain Pain - Right/Left: Right Pain - part of body: Hip                Time: 0827-0911 OT Time Calculation (min): 44 min Charges:  OT General Charges $OT Visit: 1 Visit OT Evaluation $OT Eval Low Complexity: 1 Low OT Treatments $Therapeutic Activity: 23-37 mins  Jolly Mango, OTS Acute rehab services   Jolly Mango 09/10/2022, 2:09 PM

## 2022-09-10 NOTE — TOC Initial Note (Signed)
Transition of Care [facility]) - Initial/Assessment Note    Patient Details  Name: Elaine Byrd MRN: [id number] Date of Birth: 1946-08-25  Transition of Care [facility]) CM/SW Contact:    Lennart Pall, LCSW Phone Number: 09/10/2022, 3:52 PM  Clinical Narrative:                 Met with pt today to introduce TOC/CSW role with dc planning needs.  Pt reports that she lives alone and has a son living locally who can only provide intermittent support (currently out of town for the week).  Pt feels she will need to go to SNF rehab and the process was reviewed with her.  Have begun SNF bed search process and will secure insurance authorization once bed choice made.  Expected Discharge Plan: Skilled Nursing Facility Barriers to Discharge: Continued Medical Work up, SNF Pending bed offer, Insurance Authorization   Patient Goals and CMS Choice Patient states their goals for this hospitalization and ongoing recovery are:: return home following SNF rehab CMS Medicare.gov Compare Post Acute Care list provided to:: Patient Choice offered to / list presented to : Patient  Expected Discharge Plan and Services Expected Discharge Plan: [place] In-house Referral: Clinical Social Work   Post Acute Care Choice: [place] Living arrangements for the past 2 months: [place]                 DME Arranged: N/A DME Agency: NA                  Prior Living Arrangements/Services Living arrangements for the past 2 months: Single Family Home Lives with:: Self Patient language and need for interpreter reviewed:: Yes Do you feel safe going back to the place where you live?: Yes      Need for Family Participation in Patient Care: Yes (Comment) Care giver support system in place?: No (comment)   Criminal Activity/Legal Involvement Pertinent to Current Situation/Hospitalization: No - Comment as needed  Activities of Daily Living Home Assistive Devices/Equipment:  Eyeglasses, Environmental consultant (specify type), Shower chair without back, Cane (specify quad or straight), Grab bars in shower, Raised toilet seat with rails ADL Screening (condition at time of admission) Patient's cognitive ability adequate to safely complete daily activities?: Yes Is the patient deaf or have difficulty hearing?: Yes Does the patient have difficulty seeing, even when wearing glasses/contacts?: No Does the patient have difficulty concentrating, remembering, or making decisions?: No Patient able to express need for assistance with ADLs?: Yes Does the patient have difficulty dressing or bathing?: Yes Independently performs ADLs?: No Communication: Independent Dressing (OT): Needs assistance Is this a change from baseline?: Pre-admission baseline Grooming: Needs assistance Is this a change from baseline?: Pre-admission baseline Feeding: Independent Bathing: Needs assistance Is this a change from baseline?: Pre-admission baseline Toileting: Independent In/Out Bed: Independent Walks in Home: Independent with device (comment) Does the patient have difficulty walking or climbing stairs?: Yes Weakness of Legs: Both Weakness of Arms/Hands: Both  Permission Sought/Granted Permission sought to share information with : Family Supports Permission granted to share information with : Yes, Verbal Permission Granted  Share Information with NAME: Berlinda Farve     Permission granted to share info w Relationship: son  Permission granted to share info w Contact Information: [phone]  Emotional Assessment Appearance:: Appears stated age Attitude/Demeanor/Rapport: Gracious Affect (typically observed): Accepting Orientation: : Oriented to Self, Oriented to Place, Oriented to  Time, Oriented to Situation Alcohol / Substance Use: Not Applicable Psych Involvement:  No (comment)  Admission diagnosis:  Hip fracture ([place]) [S72.009A] Closed fracture of right hip, initial encounter [facility])  [S72.001A] Patient Active Problem List   Diagnosis Date Noted   Bilateral lower extremity edema 09/09/2022   Hip fracture ([place]) 09/08/2022   Chest pain 09/08/2022   Leukocytosis 09/08/2022   Allergic reaction to insect sting 08/12/2021   Moderate persistent asthma 03/19/2020   Nontraumatic complete tear of right rotator cuff 12/06/2019   Right shoulder injury, initial encounter 11/01/2019   Severe persistent asthma without complication 36/14/4315   Toxic effect of venom of bees, unintentional 01/29/2019   Intrinsic atopic dermatitis 01/29/2019   Seasonal and perennial allergic rhinitis 01/29/2019   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   Drug-induced obesity 05/17/2018   Myalgia 04/12/2018   Gout of right foot 06/14/2017   Generalized weakness 05/20/2017   Great toe pain, right 05/20/2017   Hypertriglyceridemia 01/18/2017   Screening for osteoporosis 09/16/2016   Essential hypertension 06/15/2016   Excessive thirst 06/15/2016   Large liver 06/15/2016   GAD (generalized anxiety disorder) 04/11/2016   Hypokalemia 04/11/2016   Extrapyramidal and movement disorder, 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/16/2016   Bilateral pseudophakia 02/17/2016   Controlled substance agreement signed 02/17/2016   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 02/17/2016   Presbyopia 02/17/2016   GERD (gastroesophageal reflux disease) 02/02/2016   Idiopathic peripheral neuropathy 02/02/2016   Alcohol use 02/02/2016   Recurrent major depressive disorder, in remission ([place]) 02/02/2016   Major depressive disorder, recurrent episode, moderate ([place]) 01/10/2015   Tylenol overdose 02/01/2013   Suicidal ideation 02/01/2013   Depression 02/01/2013   Hypothyroidism 02/01/2013   PCP:  Karleen Hampshire., MD Pharmacy:   [facility] [place], [place] [address] Phone: [phone] Fax: [phone]  [place], [place] [address] Phone: [phone] Fax: [phone]     Social Determinants of Health (SDOH) Interventions    Readmission Risk Interventions    09/10/2022    1:43 PM  Readmission Risk Prevention Plan  Transportation Screening Complete  PCP or Specialist Appt within 5-7 Days Complete  Home Care Screening Complete  Medication Review (RN CM) Complete

## 2022-09-10 NOTE — Evaluation (Signed)
Physical Therapy Evaluation Patient Details Name: Elaine Byrd MRN: [id number] DOB: 1946-02-13 Today's Date: 09/10/2022  History of Present Illness  Pt. is a 76 y/o female admitted after fall resulting in Right reverse obliquity peritrochanteric femur fracture and s/p R femur IM nail on 09/09/22  Clinical Impression  Pt admitted with above diagnosis.  Pt currently with functional limitations due to the deficits listed below (see PT Problem List). Pt will benefit from skilled PT to increase their independence and safety with mobility to allow discharge to the venue listed below.  Pt extremely limited by pain and unable to tolerate any R LE movement at this time.  Pt also with new weight bearing status of TDWB which was explained to pt.  Pt reports she lives alone and has a flat way to enter her apartment however would have to traverse through grass and uneven terrain.  Pt does not appear able to function safely at home upon d/c with current precautions and assist level.  Recommend SNF upon d/c.        Recommendations for follow up therapy are one component of a multi-disciplinary discharge planning process, led by the attending physician.  Recommendations may be updated based on patient status, additional functional criteria and insurance authorization.  Follow Up Recommendations Skilled nursing-short term rehab (<3 hours/day) Can patient physically be transported by private vehicle: No    Assistance Recommended at Discharge Frequent or constant Supervision/Assistance  Patient can return home with the following  Two people to help with walking and/or transfers;Two people to help with bathing/dressing/bathroom    Equipment Recommendations Wheelchair (measurements PT);Wheelchair cushion (measurements PT)  Recommendations for Other Services       Functional Status Assessment Patient has had a recent decline in their functional status and demonstrates the ability to make significant  improvements in function in a reasonable and predictable amount of time.     Precautions / Restrictions Precautions Precautions: Fall Restrictions Weight Bearing Restrictions: Yes RLE Weight Bearing: Touchdown weight bearing      Mobility  Bed Mobility Overal bed mobility: Needs Assistance Bed Mobility: Supine to Sit     Supine to sit: Total assist, +2 for physical assistance     General bed mobility comments: pt unable to tolerate any movement or assist of R LE, not able to perform/complete bed mobility due to pain    Transfers                        Ambulation/Gait                  Stairs            Wheelchair Mobility    Modified Rankin (Stroke Patients Only)       Balance Overall balance assessment: History of Falls                                           Pertinent Vitals/Pain Pain Assessment Pain Assessment: 0-10 Pain Score: 10-Worst pain ever Pain Location: R hip with movement Pain Descriptors / Indicators: Discomfort, Throbbing, Guarding, Stabbing Pain Intervention(s): Repositioned, Monitored during session    Home Living Family/patient expects to be discharged to:: Private residence Living Arrangements: Alone Available Help at Discharge: Available PRN/intermittently Type of Home: Apartment Home Access: Level entry       Home Layout: One level Home Equipment: Toilet  riser;Grab bars - tub/shower;Cane - single point;Shower seat;Rollator (4 wheels);Rolling Walker (2 wheels)      Prior Function Prior Level of Function : Independent/Modified Independent             Mobility Comments: uses rollator ADLs Comments: has only been taking sponge baths since last year due to edema and effects of long covid.     Hand Dominance   Dominant Hand: Right    Extremity/Trunk Assessment   Upper Extremity Assessment Upper Extremity Assessment: RUE deficits/detail;LUE deficits/detail RUE Deficits / Details:  WFL-ROM RUE Sensation: WNL RUE Coordination: WNL LUE Deficits / Details: WFL-ROM LUE Sensation: WNL LUE Coordination: WNL    Lower Extremity Assessment Lower Extremity Assessment: RLE deficits/detail (pt reports chronic LE edema since Covid) RLE Deficits / Details: able to perform ankle pumps, not able to tolerate any R LE movement at knee or hip RLE: Unable to fully assess due to pain       Communication   Communication: HOH  Cognition Arousal/Alertness: Awake/alert Behavior During Therapy: WFL for tasks assessed/performed Overall Cognitive Status: Within Functional Limits for tasks assessed                                          General Comments      Exercises     Assessment/Plan    PT Assessment Patient needs continued PT services  PT Problem List Decreased strength;Decreased balance;Decreased activity tolerance;Decreased mobility;Decreased knowledge of precautions;Decreased knowledge of use of DME;Pain       PT Treatment Interventions Gait training;DME instruction;Therapeutic exercise;Functional mobility training;Patient/family education;Balance training;Wheelchair mobility training    PT Goals (Current goals can be found in the Care Plan section)  Acute Rehab PT Goals PT Goal Formulation: With patient Time For Goal Achievement: 09/24/22 Potential to Achieve Goals: Good    Frequency Min 3X/week     Co-evaluation               AM-PAC PT "6 Clicks" Mobility  Outcome Measure Help needed turning from your back to your side while in a flat bed without using bedrails?: Total Help needed moving from lying on your back to sitting on the side of a flat bed without using bedrails?: Total Help needed moving to and from a bed to a chair (including a wheelchair)?: Total Help needed standing up from a chair using your arms (e.g., wheelchair or bedside chair)?: Total Help needed to walk in hospital room?: Total Help needed climbing 3-5 steps with  a railing? : Total 6 Click Score: 6    End of Session   Activity Tolerance: Patient tolerated treatment well;Patient limited by pain Patient left: in bed;with call bell/phone within reach   PT Visit Diagnosis: Difficulty in walking, not elsewhere classified (R26.2);Pain Pain - Right/Left: Right Pain - part of body: Hip    Time: 2130-8657 PT Time Calculation (min) (ACUTE ONLY): 19 min   Charges:   PT Evaluation $PT Eval Low Complexity: 1 Low        Kati PT, DPT Physical Therapist Acute Rehabilitation Services Preferred contact method: Secure Chat Weekend Pager Only: [phone] Office: [place] 09/10/2022, 1:43 PM

## 2022-09-10 NOTE — [place] FL2 (Signed)
[place] MEDICAID FL2 LEVEL OF CARE SCREENING TOOL     IDENTIFICATION  Patient Name: Elaine Byrd Birthdate: 1946-04-05 Sex: female Admission Date (Current Location): 09/08/2022  [facility] and [place] Number:  Producer, television/film/video and Address:  [facility],  [address] [place], [place] [address]      Provider Number: [id number]  Attending Physician Name and Address:  Erick Blinks, MD  Relative Name and Phone Number:  Delmy Holdren, son, [phone]    Current Level of Care: Hospital Recommended Level of Care: Skilled Nursing Facility Prior Approval Number:    Date Approved/Denied:   PASRR Number: pending  Discharge Plan: SNF    Current Diagnoses: Patient Active Problem List   Diagnosis Date Noted   Bilateral lower extremity edema 09/09/2022   Hip fracture (HCC) 09/08/2022   Chest pain 09/08/2022   Leukocytosis 09/08/2022   Allergic reaction to insect sting 08/12/2021   Moderate persistent asthma 03/19/2020   Nontraumatic complete tear of right rotator cuff 12/06/2019   Right shoulder injury, initial encounter 11/01/2019   Severe persistent asthma without complication 03/21/2019   Toxic effect of venom of bees, unintentional 01/29/2019   Intrinsic atopic dermatitis 01/29/2019   Seasonal and perennial allergic rhinitis 01/29/2019   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   Drug-induced obesity 05/17/2018   Myalgia 04/12/2018   Gout of right foot 06/14/2017   Generalized weakness 05/20/2017   Great toe pain, right 05/20/2017   Hypertriglyceridemia 01/18/2017   Screening for osteoporosis 09/16/2016   Essential hypertension 06/15/2016   Excessive thirst 06/15/2016   Large liver 06/15/2016   GAD (generalized anxiety disorder) 04/11/2016   Hypokalemia 04/11/2016   Extrapyramidal and movement disorder, 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/16/2016   Bilateral pseudophakia 02/17/2016   Controlled substance agreement signed 02/17/2016   Keratoconjunctivitis sicca of both eyes not  specified as Sjogren's 02/17/2016   Presbyopia 02/17/2016   GERD (gastroesophageal reflux disease) 02/02/2016   Idiopathic peripheral neuropathy 02/02/2016   Alcohol use 02/02/2016   Recurrent major depressive disorder, in remission (HCC) 02/02/2016   Major depressive disorder, recurrent episode, moderate (HCC) 01/10/2015   Tylenol overdose 02/01/2013   Suicidal ideation 02/01/2013   Depression 02/01/2013   Hypothyroidism 02/01/2013    Orientation RESPIRATION BLADDER Height & Weight     Self, Time, Situation, Place  Normal Incontinent, External catheter (currently with purewick) Weight: 198 lb (89.8 kg) Height:  5\' 2"  (157.5 cm)  BEHAVIORAL SYMPTOMS/MOOD NEUROLOGICAL BOWEL NUTRITION STATUS      Continent    AMBULATORY STATUS COMMUNICATION OF NEEDS Skin   Extensive Assist Verbally Other (Comment) (surgical incision)                       Personal Care Assistance Level of Assistance  Bathing, Dressing Bathing Assistance: Limited assistance   Dressing Assistance: Limited assistance     Functional Limitations Info             SPECIAL CARE FACTORS FREQUENCY  PT (By licensed PT), OT (By licensed OT)     PT Frequency: 5x/wk OT Frequency: 5x/wk            Contractures Contractures Info: Not present    Additional Factors Info  Code Status, Allergies, Psychotropic Code Status Info: DNR Allergies Info: Morphine And Related, Penicillins, Sulfa Antibiotics Psychotropic Info: see MAR         Current Medications (09/10/2022):  This is the current hospital active medication list Current Facility-Administered Medications  Medication Dose Route Frequency Provider Last Rate  Last Admin   0.9 %  sodium chloride infusion   Intravenous PRN Erick Blinks, MD 10 mL/hr at 09/10/22 1028 New Bag at 09/10/22 1028   acetaminophen (TYLENOL) tablet 325-650 mg  325-650 mg Oral Q6H PRN Samson Frederic, MD       albuterol (PROVENTIL) (2.5 MG/3ML) 0.083% nebulizer solution 2.5 mg   2.5 mg Nebulization Q6H PRN Swinteck, Arlys John, MD       allopurinol (ZYLOPRIM) tablet 300 mg  300 mg Oral Daily Swinteck, Arlys John, MD   300 mg at 09/10/22 1009   clonazePAM (KLONOPIN) tablet 1 mg  1 mg Oral TID PRN Samson Frederic, MD       docusate sodium (COLACE) capsule 100 mg  100 mg Oral BID Samson Frederic, MD       enoxaparin (LOVENOX) injection 40 mg  40 mg Subcutaneous Q24H Swinteck, Arlys John, MD   40 mg at 09/10/22 1014   fluticasone furoate-vilanterol (BREO ELLIPTA) 200-25 MCG/ACT 1 puff  1 puff Inhalation Daily Swinteck, Arlys John, MD   1 puff at 09/10/22 0801   And   umeclidinium bromide (INCRUSE ELLIPTA) 62.5 MCG/ACT 1 puff  1 puff Inhalation Daily Swinteck, Arlys John, MD   1 puff at 09/10/22 0801   furosemide (LASIX) tablet 80 mg  80 mg Oral Daily Samson Frederic, MD   80 mg at 09/10/22 1011   hydrALAZINE (APRESOLINE) injection 5 mg  5 mg Intravenous Q6H PRN Swinteck, Arlys John, MD       HYDROmorphone (DILAUDID) injection 0.5-1 mg  0.5-1 mg Intravenous Q4H PRN Samson Frederic, MD   1 mg at 09/10/22 1012   hydrOXYzine (ATARAX) tablet 25 mg  25 mg Oral TID Samson Frederic, MD   25 mg at 09/10/22 1010   levothyroxine (SYNTHROID) tablet 100 mcg  100 mcg Oral QAC breakfast Samson Frederic, MD   100 mcg at 09/10/22 0537   menthol-cetylpyridinium (CEPACOL) lozenge 3 mg  1 lozenge Oral PRN Swinteck, Arlys John, MD       Or   phenol (CHLORASEPTIC) mouth spray 1 spray  1 spray Mouth/Throat PRN Swinteck, Arlys John, MD       methocarbamol (ROBAXIN) tablet 500 mg  500 mg Oral Q6H PRN Swinteck, Arlys John, MD       Or   methocarbamol (ROBAXIN) 500 mg in dextrose 5 % 50 mL IVPB  500 mg Intravenous Q6H PRN Swinteck, Arlys John, MD       methocarbamol (ROBAXIN) tablet 500 mg  500 mg Oral Q6H PRN Swinteck, Arlys John, MD       Or   methocarbamol (ROBAXIN) 500 mg in dextrose 5 % 50 mL IVPB  500 mg Intravenous Q6H PRN Swinteck, Arlys John, MD       metoCLOPramide (REGLAN) tablet 5-10 mg  5-10 mg Oral Q8H PRN Swinteck, Arlys John, MD       Or    metoCLOPramide (REGLAN) injection 5-10 mg  5-10 mg Intravenous Q8H PRN Swinteck, Arlys John, MD       ondansetron (ZOFRAN) tablet 4 mg  4 mg Oral Q6H PRN Swinteck, Arlys John, MD       Or   ondansetron (ZOFRAN) injection 4 mg  4 mg Intravenous Q6H PRN Swinteck, Arlys John, MD       oxyCODONE (Oxy IR/ROXICODONE) immediate release tablet 10-15 mg  10-15 mg Oral Q4H PRN Samson Frederic, MD   10 mg at 09/10/22 1404   oxyCODONE (Oxy IR/ROXICODONE) immediate release tablet 5-10 mg  5-10 mg Oral Q4H PRN Samson Frederic, MD       pantoprazole (PROTONIX) EC  tablet 40 mg  40 mg Oral Daily Swinteck, Aaron Edelman, MD   40 mg at 09/10/22 1011   polyethylene glycol (MIRALAX / GLYCOLAX) packet 17 g  17 g Oral Daily PRN Swinteck, Aaron Edelman, MD       potassium chloride (KLOR-CON M) CR tablet 10 mEq  10 mEq Oral BID Rod Can, MD   10 mEq at 09/10/22 1011   primidone (MYSOLINE) tablet 50 mg  50 mg Oral BID Rod Can, MD   50 mg at 09/10/22 1014   senna (SENOKOT) tablet 8.6 mg  1 tablet Oral BID Swinteck, Aaron Edelman, MD       spironolactone (ALDACTONE) tablet 25 mg  25 mg Oral Daily Swinteck, Aaron Edelman, MD   25 mg at 09/10/22 1011   tiZANidine (ZANAFLEX) tablet 2 mg  2 mg Oral TID Rod Can, MD   2 mg at 09/10/22 1012     Discharge Medications: Please see discharge summary for a list of discharge medications.  Relevant Imaging Results:  Relevant Lab Results:   Additional Information SS# [id number]  Lennart Pall, LCSW

## 2022-09-10 NOTE — Progress Notes (Signed)
    Subjective:  Patient reports pain as mild to moderate.  Denies N/V/CP/SOB/Abd pain. She reports pain in her leg this morning. Reports numbness in LE bilaterally in her feet at baseline.  Objective:   VITALS:   Vitals:   09/09/22 2356 09/10/22 0544 09/10/22 1009 09/10/22 1344  BP: (!) 140/69 (!) 150/74 (!) 157/79 (!) 132/58  Pulse: 97 91 99 90  Resp: 15 18  14   Temp: 98.3 F (36.8 C) 98.6 F (37 C)  98.8 F (37.1 C)  TempSrc: Oral Oral  Oral  SpO2: 97% 99%  91%  Weight:      Height:        Patient sitting up in bed this morning. NAD Neurologically intact ABD soft Neurovascular intact Sensation intact distally Intact pulses distally Dorsiflexion/Plantar flexion intact Compartment soft Mepilex dressings x3 C/D/I.  She has bilateral pitting edema in LE at baseline that is present this morning.  Reports numbness in LE bilaterally in her feet at baseline. No change from baseline.   Lab Results  Component Value Date   WBC 14.7 (H) 09/10/2022   HGB 9.9 (L) 09/10/2022   HCT 31.1 (L) 09/10/2022   MCV 103.7 (H) 09/10/2022   PLT 209 09/10/2022   BMET    Component Value Date/Time   NA 136 09/10/2022 0325   K 4.1 09/10/2022 0325   CL 103 09/10/2022 0325   CO2 26 09/10/2022 0325   GLUCOSE 137 (H) 09/10/2022 0325   BUN 16 09/10/2022 0325   CREATININE 0.81 09/10/2022 0325   CALCIUM 8.3 (L) 09/10/2022 0325   GFRNONAA >60 09/10/2022 0325     Assessment/Plan: 1 Day Post-Op   Principal Problem:   Hip fracture (HCC) Active Problems:   Depression   Hypothyroidism   GERD (gastroesophageal reflux disease)   Essential hypertension   GAD (generalized anxiety disorder)   Moderate persistent asthma   Chest pain   Leukocytosis   Bilateral lower extremity edema   TDWB RLE with walker DVT ppx: Lovenox, SCDs, TEDS. Will transition to aspirin at discharge.  PO pain control PT/OT: PT has not seen yet. PT to come by today.  Dispo: Patient under care of the medial team,  disposition per their recommendation. Pain medication and DVT ppx printed in chart.    Charlott Rakes, PA-C 09/10/2022, 1:54 PM   [facility]  [facility] [address], [place], [place] [address] Phone: [phone] [email or web address] Facebook  [employer]

## 2022-09-11 ENCOUNTER — Inpatient Hospital Stay (HOSPITAL_COMMUNITY): Payer: Medicare HMO

## 2022-09-11 DIAGNOSIS — R079 Chest pain, unspecified: Secondary | ICD-10-CM | POA: Diagnosis not present

## 2022-09-11 DIAGNOSIS — S72001A Fracture of unspecified part of neck of right femur, initial encounter for closed fracture: Secondary | ICD-10-CM | POA: Diagnosis not present

## 2022-09-11 DIAGNOSIS — R600 Localized edema: Secondary | ICD-10-CM | POA: Diagnosis not present

## 2022-09-11 DIAGNOSIS — I10 Essential (primary) hypertension: Secondary | ICD-10-CM | POA: Diagnosis not present

## 2022-09-11 LAB — BASIC METABOLIC PANEL
Anion gap: 6 (ref 5–15)
BUN: 13 mg/dL (ref 8–23)
CO2: 30 mmol/L (ref 22–32)
Calcium: 8.3 mg/dL — ABNORMAL LOW (ref 8.9–10.3)
Chloride: 100 mmol/L (ref 98–111)
Creatinine, Ser: 0.71 mg/dL (ref 0.44–1.00)
GFR, Estimated: >60 mL/min (ref >60)
Glucose, Bld: 105 mg/dL — ABNORMAL HIGH (ref 70–99)
Potassium: 3.8 mmol/L (ref 3.5–5.1)
Sodium: 136 mmol/L (ref 135–145)

## 2022-09-11 LAB — CBC
HCT: 26.9 % — ABNORMAL LOW (ref 36.0–46.0)
Hemoglobin: 8.5 g/dL — ABNORMAL LOW (ref 12.0–15.0)
MCH: 32.7 pg (ref 26.0–34.0)
MCHC: 31.6 g/dL (ref 30.0–36.0)
MCV: 103.5 fL — ABNORMAL HIGH (ref 80.0–100.0)
Platelets: 168 10*3/uL (ref 150–400)
RBC: 2.6 MIL/uL — ABNORMAL LOW (ref 3.87–5.11)
RDW: 13.4 % (ref 11.5–15.5)
WBC: 9.7 10*3/uL (ref 4.0–10.5)
nRBC: 0 % (ref 0.0–0.2)

## 2022-09-11 LAB — BRAIN NATRIURETIC PEPTIDE: B Natriuretic Peptide: 82.5 pg/mL (ref 0.0–100.0)

## 2022-09-11 LAB — FOLATE: Folate: 5.8 ng/mL — ABNORMAL LOW (ref >5.9)

## 2022-09-11 LAB — VITAMIN B12: Vitamin B-12: 68 pg/mL — ABNORMAL LOW (ref 180–914)

## 2022-09-11 MED ORDER — IPRATROPIUM-ALBUTEROL 0.5-2.5 (3) MG/3ML IN SOLN
3.0000 mL | Freq: Four times a day (QID) | RESPIRATORY_TRACT | Status: DC
  Administered 2022-09-11 (×2): 3 mL via RESPIRATORY_TRACT
  Filled 2022-09-11 (×2): qty 3

## 2022-09-11 MED ORDER — FOLIC ACID 1 MG PO TABS
1.0000 mg | ORAL_TABLET | Freq: Every day | ORAL | Status: DC
  Administered 2022-09-11 – 2022-09-14 (×4): 1 mg via ORAL
  Filled 2022-09-11 (×4): qty 1

## 2022-09-11 MED ORDER — ALBUTEROL SULFATE (2.5 MG/3ML) 0.083% IN NEBU: 2.5000 mg | INHALATION_SOLUTION | Freq: Two times a day (BID) | RESPIRATORY_TRACT | Status: DC

## 2022-09-11 MED ORDER — ALBUTEROL SULFATE (2.5 MG/3ML) 0.083% IN NEBU
2.5000 mg | INHALATION_SOLUTION | Freq: Two times a day (BID) | RESPIRATORY_TRACT | Status: DC
  Administered 2022-09-12 – 2022-09-14 (×5): 2.5 mg via RESPIRATORY_TRACT
  Filled 2022-09-11 (×6): qty 3

## 2022-09-11 MED ORDER — TIZANIDINE HCL 4 MG PO TABS: 2.0000 mg | ORAL_TABLET | Freq: Three times a day (TID) | ORAL | Status: DC | PRN

## 2022-09-11 MED ORDER — CYANOCOBALAMIN 1000 MCG/ML IJ SOLN
1000.0000 ug | Freq: Once | INTRAMUSCULAR | Status: AC
  Administered 2022-09-11: 1000 ug via INTRAMUSCULAR
  Filled 2022-09-11: qty 1

## 2022-09-11 NOTE — Progress Notes (Signed)
During shift assessment, patient c/o hard sharp pain in chest when coughing and has noticed increased mucous when coughing. Patient's 02 sat is between 89 and 91% on room air. Patient put back on 2L 02 via nasal canula and 02 sat is 97%. Called respiratory to come do nebulizer treatment for patient. Encouraged coughing/deep breathing and reinforced incentive spirometer use. Respiratory came and did breathing treatment. Notified Zoe Osipenko, Rapid response RN at 9:38 AM and Dr. Kathie Dike, MD at 9:42 AM. Gwendolyn Fill, rapid response RN came to assess patient. Patient informed rapid RN that patient has cough flair up during asthma. Dr. Roderic Palau, MD made aware of rapid response call and patient's symptoms. Patient's lung sounds diminished. Waiting on chest x-ray order from Dr. Roderic Palau, MD. Patient is alert and oriented x4. Patient stable after breathing treatment. Will continue to monitor patient.

## 2022-09-11 NOTE — Plan of Care (Signed)
  Problem: Pain Managment: Goal: General experience of comfort will improve Outcome: Progressing   

## 2022-09-11 NOTE — Plan of Care (Signed)
  Problem: Nutrition: Goal: Adequate nutrition will be maintained Outcome: Progressing   Problem: Coping: Goal: Level of anxiety will decrease Outcome: Progressing   Problem: Pain Managment: Goal: General experience of comfort will improve Outcome: Progressing   

## 2022-09-11 NOTE — Progress Notes (Addendum)
PROGRESS NOTE    Elaine Byrd  ZOX:[id number] DOB: [birth year] DOA: 09/08/2022 PCP: Laqueta Due., MD    Brief Narrative:  76 y/o female admitted to the hospital after mechanical fall resulting in right hip fracture. She also complains of chest pain that appears to musculoskeletal in origin. Ortho following for operative management    Assessment & Plan:   Principal Problem:   Hip fracture (HCC) Active Problems:   Depression   Hypothyroidism   GERD (gastroesophageal reflux disease)   Essential hypertension   GAD (generalized anxiety disorder)   Moderate persistent asthma   Chest pain   Leukocytosis   Bilateral lower extremity edema   Right hip fracture Mechanical fall at home -orthopedics following for operative management -pain control -PT/OT with recommendations for skilled nursing facility  Chest pain -appears to be quite atypical and suggestive of musculoskeletal origin -trops negative and EKG non acute -Echo done with report pending -D dimer elevated, but likely related to recent injury -she is not hypoxic, tachycardic and pain is reproducible on palpation -non contrast CT chest done on admission did not show any acute bony or parenchymal abnormalities -Wells score for PE and DVT is very low -Echocardiogram shows normal EF and normal RV function -Suspect pain is musculoskeletal in origin.  Will need further pain management  Chronic b/l LE edema -appears to have stasis changes bilaterally -chronically takes lasix and Aldactone -echo with normal EF -BNP normal  Leukocytosis -suspect stress response due to hip fracture -procalcitonin negative -no other signs of infection -back to normal range  Moderate persistent asthma -no wheezing at this time -continue bronchodilators, inhaled steroids -since she has worsening cough, will check chest xray -continue incentive spirometry  Hypothyroidism -continue on synthroid  Gout -continue allopurinol  Anemia,  macrocytic -likely multifactorial -B12 low at 68 -> replace -Folate low 5.8 -> replace -also likely has component of blood loss from surgery -continue to monitor   DVT prophylaxis: enoxaparin (LOVENOX) injection 40 mg Start: 09/10/22 0800 SCDs Start: 09/09/22 1809to begin after surgery per ortho  Code Status: DNR Family Communication: discussed with patient Disposition Plan: Status is: Inpatient Remains inpatient appropriate because: operative management of hip fracture, may need placement after surgery     Consultants:  ortho  Procedures:  Echo Intramedullary fixation, Right femur.  Antimicrobials:      Subjective: Reported by staff that patient was complaining of chest pain with coughing this morning. Symptoms improved after neb treatment  Objective: Vitals:   09/11/22 0523 09/11/22 0809 09/11/22 0946 09/11/22 1018  BP: 119/60   (!) 164/80  Pulse: 88   90  Resp: 18   14  Temp: 99.5 F (37.5 C)   98.1 F (36.7 C)  TempSrc: Oral   Oral  SpO2: 97% 97% 97% 97%  Weight:      Height:        Intake/Output Summary (Last 24 hours) at 09/11/2022 1125 Last data filed at 09/11/2022 1042 Gross per 24 hour  Intake 771.39 ml  Output 900 ml  Net -128.61 ml   Filed Weights   09/08/22 1912  Weight: 89.8 kg    Examination:  General exam: Appears calm and comfortable  Respiratory system: Clear to auscultation. Respiratory effort normal. Cardiovascular system: S1 & S2 heard, RRR. No JVD, murmurs, rubs, gallops or clicks.  Gastrointestinal system: Abdomen is nondistended, soft and nontender. No organomegaly or masses felt. Normal bowel sounds heard. Central nervous system: Alert and oriented. No focal neurological deficits. Extremities: bilateral chronic  edema with stasis changes Skin: No rashes, lesions or ulcers Psychiatry: Judgement and insight appear normal. Mood & affect appropriate.     Data Reviewed: I have personally reviewed following labs and imaging  studies  CBC: Recent Labs  Lab 09/08/22 2030 09/09/22 0353 09/10/22 0325 09/11/22 0323  WBC 25.3* 18.8* 14.7* 9.7  NEUTROABS  --  15.8*  --   --   HGB 13.4 12.8 9.9* 8.5*  HCT 40.9 39.2 31.1* 26.9*  MCV 100.5* 102.6* 103.7* 103.5*  PLT 323 283 209 168   Basic Metabolic Panel: Recent Labs  Lab 09/08/22 2030 09/10/22 0325 09/11/22 0323  NA 137 136 136  K 3.6 4.1 3.8  CL 103 103 100  CO2 23 26 30   GLUCOSE 174* 137* 105*  BUN 15 16 13   CREATININE 0.92 0.81 0.71  CALCIUM 9.2 8.3* 8.3*   GFR: Estimated Creatinine Clearance: 64.3 mL/min (by C-G formula based on SCr of 0.71 mg/dL). Liver Function Tests: No results for input(s): "AST", "ALT", "ALKPHOS", "BILITOT", "PROT", "ALBUMIN" in the last 168 hours. No results for input(s): "LIPASE", "AMYLASE" in the last 168 hours. No results for input(s): "AMMONIA" in the last 168 hours. Coagulation Profile: No results for input(s): "INR", "PROTIME" in the last 168 hours. Cardiac Enzymes: No results for input(s): "CKTOTAL", "CKMB", "CKMBINDEX", "TROPONINI" in the last 168 hours. BNP (last 3 results) No results for input(s): "PROBNP" in the last 8760 hours. HbA1C: No results for input(s): "HGBA1C" in the last 72 hours. CBG: No results for input(s): "GLUCAP" in the last 168 hours. Lipid Profile: No results for input(s): "CHOL", "HDL", "LDLCALC", "TRIG", "CHOLHDL", "LDLDIRECT" in the last 72 hours. Thyroid Function Tests: Recent Labs    09/09/22 0353  TSH 3.157   Anemia Panel: Recent Labs    09/11/22 0323  VITAMINB12 68*  FOLATE 5.8*   Sepsis Labs: Recent Labs  Lab 09/09/22 0353  PROCALCITON <0.10    Recent Results (from the past 240 hour(s))  MRSA Next Gen by PCR, Nasal     Status: None   Collection Time: 09/09/22  9:37 AM   Specimen: Nasal Mucosa; Nasal Swab  Result Value Ref Range Status   MRSA by PCR Next Gen NOT DETECTED NOT DETECTED Final    Comment: (NOTE) The GeneXpert MRSA Assay (FDA approved for NASAL  specimens only), is one component of a comprehensive MRSA colonization surveillance program. It is not intended to diagnose MRSA infection nor to guide or monitor treatment for MRSA infections. Test performance is not FDA approved in patients less than 44 years old. Performed at [facility], [address] [place], [place]          Radiology Studies: Pelvis Portable  Result Date: 09/09/2022 CLINICAL DATA:  Closed. Trochanteric fracture of femur. Post right IM nail. EXAM: PORTABLE PELVIS 1-2 VIEWS COMPARISON:  09/08/2022 FINDINGS: Postoperative changes with interval intramedullary rod and compression screw fixation of an inter trochanteric fracture of the proximal right femur. Inferior aspect of the intramedullary rod is not included within the field of view. Fracture lines remain visible. Soft tissue gas consistent with recent surgery. Mild valgus orientation is suggested. No dislocation of the hip joint. Degenerative changes in the hips and lower lumbar spine. IMPRESSION: Postoperative internal fixation of inter trochanteric right hip fracture. Electronically Signed   By: 11/09/2022 M.D.   On: 09/09/2022 19:04   DG FEMUR PORT, MIN 2 VIEWS RIGHT  Result Date: 09/09/2022 CLINICAL DATA:  Closed peritrochanteric fracture of femur,  right, initial encounter. Status post intramedullary nail. EXAM: RIGHT FEMUR PORTABLE 2 VIEW COMPARISON:  Right hip radiographs 09/08/2022 FINDINGS: Interval long cephalomedullary nail fixation of the previously seen distal right intertrochanteric fracture. There is improved, near anatomic alignment. No perihardware lucency is seen to indicate hardware failure or loosening. Severe right femoroacetabular joint space narrowing. Diffuse decreased bone mineralization. IMPRESSION: Interval long cephalomedullary nail fixation of the previously seen distal right intertrochanteric fracture with improved, near anatomic alignment.  Electronically Signed   By: Yvonne Kendall M.D.   On: 09/09/2022 17:15   DG FEMUR, MIN 2 VIEWS RIGHT  Result Date: 09/09/2022 CLINICAL DATA:  Fluoroscopic assistance for internal fixation of fracture of right femur EXAM: RIGHT FEMUR 2 VIEWS COMPARISON:  09/08/2022 FINDINGS: Fluoroscopic images show reduction and internal fixation of fracture of proximal shaft of right femur with intramedullary rod. Fluoroscopic time 1 minutes and 29 seconds. Radiation dose 14.62 mGy. IMPRESSION: Fluoroscopic assistance was provided for reduction and internal fixation of fracture of proximal right femur. Electronically Signed   By: Elmer Picker M.D.   On: 09/09/2022 15:32   DG C-Arm 1-60 Min-No Report  Result Date: 09/09/2022 Fluoroscopy was utilized by the requesting physician.  No radiographic interpretation.   DG C-Arm 1-60 Min-No Report  Result Date: 09/09/2022 Fluoroscopy was utilized by the requesting physician.  No radiographic interpretation.        Scheduled Meds:  allopurinol  300 mg Oral Daily   docusate sodium  100 mg Oral BID   enoxaparin (LOVENOX) injection  40 mg Subcutaneous Q24H   fluticasone furoate-vilanterol  1 puff Inhalation Daily   And   umeclidinium bromide  1 puff Inhalation Daily   furosemide  80 mg Oral Daily   hydrOXYzine  25 mg Oral TID   ipratropium-albuterol  3 mL Nebulization Q6H   levothyroxine  100 mcg Oral QAC breakfast   pantoprazole  40 mg Oral Daily   potassium chloride  10 mEq Oral BID   primidone  50 mg Oral BID   senna  1 tablet Oral BID   spironolactone  25 mg Oral Daily   Continuous Infusions:  sodium chloride Stopped (09/10/22 2156)   methocarbamol (ROBAXIN) IV     methocarbamol (ROBAXIN) IV       LOS: 3 days    Time spent: 27mins    Kathie Dike, MD [facility]   If 7PM-7AM, please contact night-coverage [email or web address]  09/11/2022, 11:25 AM

## 2022-09-12 DIAGNOSIS — S72001A Fracture of unspecified part of neck of right femur, initial encounter for closed fracture: Secondary | ICD-10-CM | POA: Diagnosis not present

## 2022-09-12 DIAGNOSIS — I10 Essential (primary) hypertension: Secondary | ICD-10-CM | POA: Diagnosis not present

## 2022-09-12 DIAGNOSIS — R600 Localized edema: Secondary | ICD-10-CM | POA: Diagnosis not present

## 2022-09-12 DIAGNOSIS — R079 Chest pain, unspecified: Secondary | ICD-10-CM | POA: Diagnosis not present

## 2022-09-12 LAB — BASIC METABOLIC PANEL
Anion gap: 8 (ref 5–15)
BUN: 13 mg/dL (ref 8–23)
CO2: 31 mmol/L (ref 22–32)
Calcium: 8.6 mg/dL — ABNORMAL LOW (ref 8.9–10.3)
Chloride: 98 mmol/L (ref 98–111)
Creatinine, Ser: 0.81 mg/dL (ref 0.44–1.00)
GFR, Estimated: >60 mL/min (ref >60)
Glucose, Bld: 126 mg/dL — ABNORMAL HIGH (ref 70–99)
Potassium: 3.9 mmol/L (ref 3.5–5.1)
Sodium: 137 mmol/L (ref 135–145)

## 2022-09-12 LAB — CBC
HCT: 29.2 % — ABNORMAL LOW (ref 36.0–46.0)
Hemoglobin: 9.3 g/dL — ABNORMAL LOW (ref 12.0–15.0)
MCH: 33.1 pg (ref 26.0–34.0)
MCHC: 31.8 g/dL (ref 30.0–36.0)
MCV: 103.9 fL — ABNORMAL HIGH (ref 80.0–100.0)
Platelets: 193 10*3/uL (ref 150–400)
RBC: 2.81 MIL/uL — ABNORMAL LOW (ref 3.87–5.11)
RDW: 13.3 % (ref 11.5–15.5)
WBC: 10 10*3/uL (ref 4.0–10.5)
nRBC: 0 % (ref 0.0–0.2)

## 2022-09-12 MED ORDER — POLYVINYL ALCOHOL 1.4 % OP SOLN
1.0000 [drp] | OPHTHALMIC | Status: DC | PRN
  Filled 2022-09-12: qty 15

## 2022-09-12 MED ORDER — CYANOCOBALAMIN 1000 MCG/ML IJ SOLN
1000.0000 ug | Freq: Once | INTRAMUSCULAR | Status: AC
  Administered 2022-09-12: 1000 ug via INTRAMUSCULAR
  Filled 2022-09-12: qty 1

## 2022-09-12 NOTE — Progress Notes (Signed)
Patient NSR on the monitor, but she said she her chest was feeling funny and she was having pain in her rib cage. On the monitor, it shows "missed beats." Did EKG.  EKG results:NSR. Pt is wondering if her body is not liking the BP meds. Messaged Dr. Kathie Dike, MD to make aware. Patient is alert and oriented and not having any shortness of breath. Will continue to monitor patient.

## 2022-09-12 NOTE — Consult Note (Signed)
WOC Nurse Consult Note: Reason for Consult:skin tear to left forearm. Approximated with steri strips in ED. Wound type:Trauma Pressure Injury POA: NA Measurement:4cm x 3cm skin flap, avulsion Wound bed:N/A Drainage (amount, consistency, odor) none Periwound:intact with bruising Dressing procedure/placement/frequency:Topical care is to leave steri strips in place until they fall off, approximately 10-14 days.  Cover with dry gauze and secure with a few turns of Kerlix roll gauze/paper tape. At home, patient may protect arm with a clean sock with the foot cut off rather than Kerlix if she elects.  [place] nursing team will not follow, but will remain available to this patient, the nursing and medical teams.  Please re-consult if needed.  Thank you for inviting [place] to participate in this patient's Plan of Care.  Maudie Flakes, MSN, RN, CNS, [place], Serita Grammes, [employer], [employer] phone:  [phone]

## 2022-09-12 NOTE — Progress Notes (Signed)
Reached out to PT to see if they can come do a PT session with patient today. PT tied up, but will try to work her in. Will continue to monitor patient.

## 2022-09-12 NOTE — Plan of Care (Signed)
  Problem: Education: Goal: Knowledge of General Education information will improve Description Including pain rating scale, medication(s)/side effects and non-pharmacologic comfort measures Outcome: Progressing   

## 2022-09-12 NOTE — Progress Notes (Signed)
EKG results placed in patient's chart. Results: Normal sinus rhythm.

## 2022-09-12 NOTE — Progress Notes (Signed)
PROGRESS NOTE    Elaine Byrd  YOV:[id number] DOB: [birth year] DOA: 09/08/2022 PCP: Laqueta Due., MD    Brief Narrative:  76 y/o female admitted to the hospital after mechanical fall resulting in right hip fracture. She also complains of chest pain that appears to musculoskeletal in origin. Ortho following for operative management    Assessment & Plan:   Principal Problem:   Hip fracture (HCC) Active Problems:   Depression   Hypothyroidism   GERD (gastroesophageal reflux disease)   Essential hypertension   GAD (generalized anxiety disorder)   Moderate persistent asthma   Chest pain   Leukocytosis   Bilateral lower extremity edema   Right hip fracture Mechanical fall at home -orthopedics following for operative management -pain control -PT/OT with recommendations for skilled nursing facility  Chest pain -appears to be quite atypical and suggestive of musculoskeletal origin -trops negative and EKG non acute -Echo done with report pending -D dimer elevated, but likely related to recent injury -she is not hypoxic, tachycardic and pain is reproducible on palpation -non contrast CT chest done on admission did not show any acute bony or parenchymal abnormalities -Wells score for PE and DVT is very low -Echocardiogram shows normal EF and normal RV function -Suspect pain is musculoskeletal in origin.  Will need further pain management  Chronic b/l LE edema -appears to have stasis changes bilaterally -chronically takes lasix and Aldactone -echo with normal EF -BNP normal  Leukocytosis -suspect stress response due to hip fracture -procalcitonin negative -no other signs of infection -back to normal range  Moderate persistent asthma -no wheezing at this time -continue bronchodilators, inhaled steroids -Chest x-ray without any signs of pneumonia -continue incentive spirometry  Hypothyroidism -continue on synthroid  Gout -continue allopurinol  Anemia,  macrocytic -likely multifactorial -B12 low at 68 -> replace -Folate low 5.8 -> replace -also likely has component of blood loss from surgery -Hemoglobin currently stable -continue to monitor   DVT prophylaxis: enoxaparin (LOVENOX) injection 40 mg Start: 09/10/22 0800 SCDs Start: 09/09/22 1809  Code Status: DNR Family Communication: discussed with patient Disposition Plan: Status is: Inpatient Remains inpatient appropriate because: operative management of hip fracture, may need placement after surgery     Consultants:  ortho  Procedures:  Echo Intramedullary fixation, Right femur.  Antimicrobials:      Subjective: Reports that her "chest felt funny" after receiving hydralazine.  Denies any worsening shortness of breath.  Continues to have pain in her hip.  Objective: Vitals:   09/12/22 0834 09/12/22 0919 09/12/22 1225 09/12/22 1341  BP: (!) 186/74 (!) 148/55 (!) 145/62 (!) 145/60  Pulse: 87 92 85 87  Resp:  19  16  Temp:  99.3 F (37.4 C)  99.5 F (37.5 C)  TempSrc:  Oral  Oral  SpO2:  100% 99% 100%  Weight:      Height:        Intake/Output Summary (Last 24 hours) at 09/12/2022 1701 Last data filed at 09/12/2022 1600 Gross per 24 hour  Intake 1080 ml  Output 3000 ml  Net -1920 ml   Filed Weights   09/08/22 1912  Weight: 89.8 kg    Examination:  General exam: Appears calm and comfortable Respiratory system: Clear to auscultation. Respiratory effort normal. Cardiovascular system: S1 & S2 heard, RRR. No JVD, murmurs, rubs, gallops or clicks.  Gastrointestinal system: Abdomen is nondistended, soft and nontender. No organomegaly or masses felt. Normal bowel sounds heard. Central nervous system: Alert and oriented. No focal neurological deficits.  Extremities: bilateral chronic edema with stasis changes Skin: No rashes, lesions or ulcers Psychiatry: Judgement and insight appear normal. Mood & affect appropriate.     Data Reviewed: I have personally  reviewed following labs and imaging studies  CBC: Recent Labs  Lab 09/08/22 2030 09/09/22 0353 09/10/22 0325 09/11/22 0323 09/12/22 0324  WBC 25.3* 18.8* 14.7* 9.7 10.0  NEUTROABS  --  15.8*  --   --   --   HGB 13.4 12.8 9.9* 8.5* 9.3*  HCT 40.9 39.2 31.1* 26.9* 29.2*  MCV 100.5* 102.6* 103.7* 103.5* 103.9*  PLT 323 283 209 168 951   Basic Metabolic Panel: Recent Labs  Lab 09/08/22 2030 09/10/22 0325 09/11/22 0323 09/12/22 0324  NA 137 136 136 137  K 3.6 4.1 3.8 3.9  CL 103 103 100 98  CO2 23 26 30 31   GLUCOSE 174* 137* 105* 126*  BUN 15 16 13 13   CREATININE 0.92 0.81 0.71 0.81  CALCIUM 9.2 8.3* 8.3* 8.6*   GFR: Estimated Creatinine Clearance: 63.5 mL/min (by C-G formula based on SCr of 0.81 mg/dL). Liver Function Tests: No results for input(s): "AST", "ALT", "ALKPHOS", "BILITOT", "PROT", "ALBUMIN" in the last 168 hours. No results for input(s): "LIPASE", "AMYLASE" in the last 168 hours. No results for input(s): "AMMONIA" in the last 168 hours. Coagulation Profile: No results for input(s): "INR", "PROTIME" in the last 168 hours. Cardiac Enzymes: No results for input(s): "CKTOTAL", "CKMB", "CKMBINDEX", "TROPONINI" in the last 168 hours. BNP (last 3 results) No results for input(s): "PROBNP" in the last 8760 hours. HbA1C: No results for input(s): "HGBA1C" in the last 72 hours. CBG: No results for input(s): "GLUCAP" in the last 168 hours. Lipid Profile: No results for input(s): "CHOL", "HDL", "LDLCALC", "TRIG", "CHOLHDL", "LDLDIRECT" in the last 72 hours. Thyroid Function Tests: No results for input(s): "TSH", "T4TOTAL", "FREET4", "T3FREE", "THYROIDAB" in the last 72 hours.  Anemia Panel: Recent Labs    09/11/22 0323  VITAMINB12 68*  FOLATE 5.8*   Sepsis Labs: Recent Labs  Lab 09/09/22 0353  PROCALCITON <0.10    Recent Results (from the past 240 hour(s))  MRSA Next Gen by PCR, Nasal     Status: None   Collection Time: 09/09/22  9:37 AM   Specimen:  Nasal Mucosa; Nasal Swab  Result Value Ref Range Status   MRSA by PCR Next Gen NOT DETECTED NOT DETECTED Final    Comment: (NOTE) The GeneXpert MRSA Assay (FDA approved for NASAL specimens only), is one component of a comprehensive MRSA colonization surveillance program. It is not intended to diagnose MRSA infection nor to guide or monitor treatment for MRSA infections. Test performance is not FDA approved in patients less than 47 years old. Performed at [facility], [place] [address] [place], [place] [address]          Radiology Studies: DG CHEST PORT 1 VIEW  Result Date: 09/11/2022 CLINICAL DATA:  Chest pain and shortness of breath. EXAM: PORTABLE CHEST 1 VIEW COMPARISON:  September 08, 2022 FINDINGS: The heart size and mediastinal contours are stable. Heart size is enlarged. Both lungs are clear. The visualized skeletal structures are unremarkable. IMPRESSION: No active cardiopulmonary disease identified. Electronically Signed   By: Abelardo Diesel M.D.   On: 09/11/2022 12:00        Scheduled Meds:  albuterol  2.5 mg Nebulization BID   allopurinol  300 mg Oral Daily   docusate sodium  100 mg Oral BID   enoxaparin (LOVENOX) injection  40  mg Subcutaneous Q24H   fluticasone furoate-vilanterol  1 puff Inhalation Daily   And   umeclidinium bromide  1 puff Inhalation Daily   folic acid  1 mg Oral Daily   furosemide  80 mg Oral Daily   hydrOXYzine  25 mg Oral TID   levothyroxine  100 mcg Oral QAC breakfast   pantoprazole  40 mg Oral Daily   potassium chloride  10 mEq Oral BID   primidone  50 mg Oral BID   senna  1 tablet Oral BID   spironolactone  25 mg Oral Daily   Continuous Infusions:  sodium chloride Stopped (09/10/22 2156)   methocarbamol (ROBAXIN) IV       LOS: 4 days    Time spent:    Erick Blinks, MD [facility]   If 7PM-7AM, please contact night-coverage [email or web address]  09/12/2022, 5:01 PM

## 2022-09-12 NOTE — Progress Notes (Signed)
PT has not seen the patient today. Talked to patient about dangling on the side of the bed. Patient refused after getting washed up/full linen change. Patient willing to dangle on the side of the bed tomorrow. Will continue to monitor patient.

## 2022-09-12 NOTE — TOC Progression Note (Signed)
Transition of Care [facility]) - Progression Note    Patient Details  Name: Elaine Byrd MRN: [id number] Date of Birth: [birth year]  Transition of Care [facility]) CM/SW Contact  Ross Ludwig, [place] Phone Number: 09/12/2022, 11:14 AM  Clinical Narrative:     CSW checked to see if [place] number is back yet, it is still being reviewed.  CSW to continue to follow patient's progress.  Expected Discharge Plan: [place] Barriers to Discharge: Continued Medical Work up, SNF Pending bed offer, Ship broker  Expected Discharge Plan and Services Expected Discharge Plan: [place] In-house Referral: Clinical Social Work   Post Acute Care Choice: [place] Living arrangements for the past 2 months: Single Family Home                 DME Arranged: N/A DME Agency: NA                   Social Determinants of Health (SDOH) Interventions    Readmission Risk Interventions    09/10/2022    1:43 PM  Readmission Risk Prevention Plan  Transportation Screening Complete  PCP or Specialist Appt within 5-7 Days Complete  Home Care Screening Complete  Medication Review (RN CM) Complete

## 2022-09-12 NOTE — Plan of Care (Signed)
  Problem: Education: Goal: Knowledge of General Education information will improve Description: Including pain rating scale, medication(s)/side effects and non-pharmacologic comfort measures Outcome: Progressing   Problem: Clinical Measurements: Goal: Respiratory complications will improve Outcome: Progressing   Problem: Nutrition: Goal: Adequate nutrition will be maintained Outcome: Progressing   Problem: Coping: Goal: Level of anxiety will decrease Outcome: Progressing   Problem: Elimination: Goal: Will not experience complications related to urinary retention Outcome: Progressing   Problem: Pain Managment: Goal: General experience of comfort will improve Outcome: Progressing   

## 2022-09-13 DIAGNOSIS — S72001A Fracture of unspecified part of neck of right femur, initial encounter for closed fracture: Secondary | ICD-10-CM | POA: Diagnosis not present

## 2022-09-13 DIAGNOSIS — R079 Chest pain, unspecified: Secondary | ICD-10-CM | POA: Diagnosis not present

## 2022-09-13 DIAGNOSIS — R600 Localized edema: Secondary | ICD-10-CM | POA: Diagnosis not present

## 2022-09-13 DIAGNOSIS — I10 Essential (primary) hypertension: Secondary | ICD-10-CM | POA: Diagnosis not present

## 2022-09-13 MED ORDER — CLONAZEPAM 2 MG PO TABS: 1.0000 mg | ORAL_TABLET | Freq: Three times a day (TID) | ORAL | 0 refills | Status: AC | PRN

## 2022-09-13 MED ORDER — POLYETHYLENE GLYCOL 3350 17 G PO PACK: 17.0000 g | PACK | Freq: Every day | ORAL | 0 refills | Status: AC

## 2022-09-13 MED ORDER — TIZANIDINE HCL 4 MG PO TABS: 2.0000 mg | ORAL_TABLET | Freq: Three times a day (TID) | ORAL | 0 refills | Status: AC | PRN

## 2022-09-13 MED ORDER — MAGNESIUM HYDROXIDE 400 MG/5ML PO SUSP
30.0000 mL | Freq: Every day | ORAL | Status: DC
  Administered 2022-09-13 – 2022-09-14 (×2): 30 mL via ORAL
  Filled 2022-09-13 (×2): qty 30

## 2022-09-13 MED ORDER — SENNA 8.6 MG PO TABS
2.0000 | ORAL_TABLET | Freq: Two times a day (BID) | ORAL | Status: DC
  Administered 2022-09-13 – 2022-09-14 (×3): 17.2 mg via ORAL
  Filled 2022-09-13 (×3): qty 2

## 2022-09-13 MED ORDER — POLYETHYLENE GLYCOL 3350 17 G PO PACK
17.0000 g | PACK | Freq: Two times a day (BID) | ORAL | Status: DC
  Administered 2022-09-13 – 2022-09-14 (×2): 17 g via ORAL
  Filled 2022-09-13 (×3): qty 1

## 2022-09-13 MED ORDER — CYANOCOBALAMIN 1000 MCG PO TABS: 1000.0000 ug | ORAL_TABLET | Freq: Every day | ORAL | Status: AC

## 2022-09-13 MED ORDER — FOLIC ACID 1 MG PO TABS: 1.0000 mg | ORAL_TABLET | Freq: Every day | ORAL | Status: AC

## 2022-09-13 MED ORDER — VITAMIN B-12 1000 MCG PO TABS
1000.0000 ug | ORAL_TABLET | Freq: Every day | ORAL | Status: DC
  Administered 2022-09-13 – 2022-09-14 (×2): 1000 ug via ORAL
  Filled 2022-09-13 (×2): qty 1

## 2022-09-13 MED ORDER — DOCUSATE SODIUM 100 MG PO CAPS: 100.0000 mg | ORAL_CAPSULE | Freq: Two times a day (BID) | ORAL | 0 refills | Status: AC

## 2022-09-13 NOTE — Progress Notes (Signed)
    Subjective:  Patient reports pain as mild to moderate.  Denies N/V/CP/SOB. She reports she is still having difficulty moving leg due to pain. Encouraged to continue to work on movement and with PT.   Objective:   VITALS:   Vitals:   09/12/22 2035 09/12/22 2118 09/13/22 0603 09/13/22 0722  BP:  (!) 162/66 (!) 176/74   Pulse:  95 87   Resp:  18 18   Temp:  98.3 F (36.8 C) 98.6 F (37 C)   TempSrc:  Oral    SpO2: 99% 99% 98% 98%  Weight:      Height:        Patient lying in bed. NAD.  Neurologically intact ABD soft Neurovascular intact Intact pulses distally Dorsiflexion/Plantar flexion intact Compartment soft Mepilex dressings x3 C/D/I.  She has bilateral pitting edema in LE at baseline that is present this morning.  Reports numbness in LE bilaterally in her feet at baseline. No change from baseline.     Lab Results  Component Value Date   WBC 10.0 09/12/2022   HGB 9.3 (L) 09/12/2022   HCT 29.2 (L) 09/12/2022   MCV 103.9 (H) 09/12/2022   PLT 193 09/12/2022   BMET    Component Value Date/Time   NA 137 09/12/2022 0324   K 3.9 09/12/2022 0324   CL 98 09/12/2022 0324   CO2 31 09/12/2022 0324   GLUCOSE 126 (H) 09/12/2022 0324   BUN 13 09/12/2022 0324   CREATININE 0.81 09/12/2022 0324   CALCIUM 8.6 (L) 09/12/2022 0324   GFRNONAA >60 09/12/2022 0324     Assessment/Plan: 4 Days Post-Op   Principal Problem:   Hip fracture (HCC) Active Problems:   Depression   Hypothyroidism   GERD (gastroesophageal reflux disease)   Essential hypertension   GAD (generalized anxiety disorder)   Moderate persistent asthma   Chest pain   Leukocytosis   Bilateral lower extremity edema   TDWB RLE with walker DVT ppx: Lovenox, SCDs, TEDS. Will transition to aspirin at discharge.  PO pain control PT/OT: Continue PT.  Dispo: Patient under care of the medial team, disposition per their recommendation. Pain medication and DVT ppx printed in chart.    Charlott Rakes,  PA-C 09/13/2022, 8:04 AM   [facility]  [place] [address], [address], [place], [place] [address] Phone: [phone] [email or web address] Facebook  [employer]

## 2022-09-13 NOTE — Progress Notes (Signed)
PROGRESS NOTE    Elaine Byrd  FAO:[id number] DOB: 22-Oct-1946 DOA: 09/08/2022 PCP: Laqueta Due., MD    Brief Narrative:  76 y/o female admitted to the hospital after mechanical fall resulting in right hip fracture. She also complains of chest pain that appears to musculoskeletal in origin. She underwent operative management of her hip that was uncomplicated. She is ready for discharge to SNF when bed available   Assessment & Plan:   Principal Problem:   Hip fracture (HCC) Active Problems:   Depression   Hypothyroidism   GERD (gastroesophageal reflux disease)   Essential hypertension   GAD (generalized anxiety disorder)   Moderate persistent asthma   Chest pain   Leukocytosis   Bilateral lower extremity edema   Right hip fracture Mechanical fall at home -orthopedics following for operative management -pain control -PT/OT with recommendations for skilled nursing facility  Chest pain -appears to be quite atypical and suggestive of musculoskeletal origin -trops negative and EKG non acute -Echo done with report pending -D dimer elevated, but likely related to recent injury -she is not hypoxic, tachycardic and pain is reproducible on palpation -non contrast CT chest done on admission did not show any acute bony or parenchymal abnormalities -Wells score for PE and DVT is very low -Echocardiogram shows normal EF and normal RV function -Suspect pain is musculoskeletal in origin.  Will need further pain management  Chronic b/l LE edema -appears to have stasis changes bilaterally -chronically takes lasix and Aldactone -echo with normal EF -BNP normal  Leukocytosis -suspect stress response due to hip fracture -procalcitonin negative -no other signs of infection -back to normal range  Moderate persistent asthma -no wheezing at this time -continue bronchodilators, inhaled steroids -Chest x-ray without any signs of pneumonia -continue incentive  spirometry  Hypothyroidism -continue on synthroid  Gout -continue allopurinol  Anemia, macrocytic -likely multifactorial -B12 low at 68 -> replace -Folate low 5.8 -> replace -also likely has component of blood loss from surgery -Hemoglobin currently stable -continue to monitor   DVT prophylaxis: enoxaparin (LOVENOX) injection 40 mg Start: 09/10/22 0800 SCDs Start: 09/09/22 1809  Code Status: DNR Family Communication: discussed with patient Disposition Plan: Status is: Inpatient Remains inpatient appropriate because: Awaiting placement to skilled nursing facility     Consultants:  ortho  Procedures:  Echo Intramedullary fixation, Right femur.  Antimicrobials:      Subjective: She says she feels better today.  Shortness of breath improving.  She did more with physical therapy today.  Continues to have pain.  Objective: Vitals:   09/12/22 2118 09/13/22 0603 09/13/22 0722 09/13/22 1331  BP: (!) 162/66 (!) 176/74  (!) 152/71  Pulse: 95 87  90  Resp: 18 18  18   Temp: 98.3 F (36.8 C) 98.6 F (37 C)  98.6 F (37 C)  TempSrc: Oral     SpO2: 99% 98% 98% 93%  Weight:      Height:        Intake/Output Summary (Last 24 hours) at 09/13/2022 1625 Last data filed at 09/13/2022 0946 Gross per 24 hour  Intake 0 ml  Output 400 ml  Net -400 ml   Filed Weights   09/08/22 1912  Weight: 89.8 kg    Examination:  General exam: Appears calm and comfortable Respiratory system: Clear to auscultation. Respiratory effort normal. Cardiovascular system: S1 & S2 heard, RRR. No JVD, murmurs, rubs, gallops or clicks.  Gastrointestinal system: Abdomen is nondistended, soft and nontender. No organomegaly or masses felt. Normal bowel  sounds heard. Central nervous system: Alert and oriented. No focal neurological deficits. Extremities: bilateral chronic edema with stasis changes Skin: No rashes, lesions or ulcers Psychiatry: Judgement and insight appear normal. Mood & affect  appropriate.     Data Reviewed: I have personally reviewed following labs and imaging studies  CBC: Recent Labs  Lab 09/08/22 2030 09/09/22 0353 09/10/22 0325 09/11/22 0323 09/12/22 0324  WBC 25.3* 18.8* 14.7* 9.7 10.0  NEUTROABS  --  15.8*  --   --   --   HGB 13.4 12.8 9.9* 8.5* 9.3*  HCT 40.9 39.2 31.1* 26.9* 29.2*  MCV 100.5* 102.6* 103.7* 103.5* 103.9*  PLT 323 283 209 168 193   Basic Metabolic Panel: Recent Labs  Lab 09/08/22 2030 09/10/22 0325 09/11/22 0323 09/12/22 0324  NA 137 136 136 137  K 3.6 4.1 3.8 3.9  CL 103 103 100 98  CO2 23 26 30 31   GLUCOSE 174* 137* 105* 126*  BUN 15 16 13 13   CREATININE 0.92 0.81 0.71 0.81  CALCIUM 9.2 8.3* 8.3* 8.6*   GFR: Estimated Creatinine Clearance: 63.5 mL/min (by C-G formula based on SCr of 0.81 mg/dL). Liver Function Tests: No results for input(s): "AST", "ALT", "ALKPHOS", "BILITOT", "PROT", "ALBUMIN" in the last 168 hours. No results for input(s): "LIPASE", "AMYLASE" in the last 168 hours. No results for input(s): "AMMONIA" in the last 168 hours. Coagulation Profile: No results for input(s): "INR", "PROTIME" in the last 168 hours. Cardiac Enzymes: No results for input(s): "CKTOTAL", "CKMB", "CKMBINDEX", "TROPONINI" in the last 168 hours. BNP (last 3 results) No results for input(s): "PROBNP" in the last 8760 hours. HbA1C: No results for input(s): "HGBA1C" in the last 72 hours. CBG: No results for input(s): "GLUCAP" in the last 168 hours. Lipid Profile: No results for input(s): "CHOL", "HDL", "LDLCALC", "TRIG", "CHOLHDL", "LDLDIRECT" in the last 72 hours. Thyroid Function Tests: No results for input(s): "TSH", "T4TOTAL", "FREET4", "T3FREE", "THYROIDAB" in the last 72 hours.  Anemia Panel: Recent Labs    09/11/22 0323  VITAMINB12 68*  FOLATE 5.8*   Sepsis Labs: Recent Labs  Lab 09/09/22 0353  PROCALCITON <0.10    Recent Results (from the past 240 hour(s))  MRSA Next Gen by PCR, Nasal     Status:  None   Collection Time: 09/09/22  9:37 AM   Specimen: Nasal Mucosa; Nasal Swab  Result Value Ref Range Status   MRSA by PCR Next Gen NOT DETECTED NOT DETECTED Final    Comment: (NOTE) The GeneXpert MRSA Assay (FDA approved for NASAL specimens only), is one component of a comprehensive MRSA colonization surveillance program. It is not intended to diagnose MRSA infection nor to guide or monitor treatment for MRSA infections. Test performance is not FDA approved in patients less than 44 years old. Performed at [facility], [address], [place], [place]          Radiology Studies: No results found.      Scheduled Meds:  albuterol  2.5 mg Nebulization BID   allopurinol  300 mg Oral Daily   docusate sodium  100 mg Oral BID   enoxaparin (LOVENOX) injection  40 mg Subcutaneous Q24H   fluticasone furoate-vilanterol  1 puff Inhalation Daily   And   umeclidinium bromide  1 puff Inhalation Daily   folic acid  1 mg Oral Daily   furosemide  80 mg Oral Daily   hydrOXYzine  25 mg Oral TID   levothyroxine  100 mcg Oral QAC breakfast  magnesium hydroxide  30 mL Oral Daily   pantoprazole  40 mg Oral Daily   polyethylene glycol  17 g Oral BID   potassium chloride  10 mEq Oral BID   primidone  50 mg Oral BID   senna  2 tablet Oral BID   spironolactone  25 mg Oral Daily   Continuous Infusions:  sodium chloride Stopped (09/10/22 2156)   methocarbamol (ROBAXIN) IV       LOS: 5 days    Time spent: 85mins    Kathie Dike, MD [facility]   If 7PM-7AM, please contact night-coverage [email or web address]  09/13/2022, 4:25 PM

## 2022-09-13 NOTE — Progress Notes (Signed)
Physical Therapy Treatment Patient Details Name: Elaine Byrd MRN: [id number] DOB: 14-May-1946 Today's Date: 09/13/2022   History of Present Illness Pt. is a 76 y/o female admitted after fall resulting in Right reverse obliquity peritrochanteric femur fracture and s/p R femur IM nail on 09/09/22    PT Comments    Pt demonstrates improved ability to self assist this session attempting to advance LLE off bed and use rail to elevate trunk.   Recommendations for follow up therapy are one component of a multi-disciplinary discharge planning process, led by the attending physician.  Recommendations may be updated based on patient status, additional functional criteria and insurance authorization.  Follow Up Recommendations  Skilled nursing-short term rehab (<3 hours/day) Can patient physically be transported by private vehicle: No   Assistance Recommended at Discharge Frequent or constant Supervision/Assistance  Patient can return home with the following Two people to help with walking and/or transfers;Two people to help with bathing/dressing/bathroom   Equipment Recommendations  Wheelchair (measurements PT);Wheelchair cushion (measurements PT)    Recommendations for Other Services       Precautions / Restrictions Precautions Precautions: Fall Restrictions RLE Weight Bearing: Touchdown weight bearing     Mobility  Bed Mobility Overal bed mobility: Needs Assistance Bed Mobility: Supine to Sit, Sit to Supine     Supine to sit: Total assist, +2 for physical assistance Sit to supine: +2 for physical assistance, +2 for safety/equipment, Total assist   General bed mobility comments: assist with bil LEs and trunk to upright. pt able to assist progressing LLE off bed as well as reaching for rail. assist to control trunk descent and lift LEs on to bed    Transfers                   General transfer comment: pt dizzy upon sitting, improved after being upright for ~ 3 minutes.  pt declined any attempt to stand    Ambulation/Gait                   Stairs             Wheelchair Mobility    Modified Rankin (Stroke Patients Only)       Balance                                            Cognition Arousal/Alertness: Awake/alert Behavior During Therapy: WFL for tasks assessed/performed Overall Cognitive Status: Within Functional Limits for tasks assessed                                          Exercises      General Comments        Pertinent Vitals/Pain Pain Assessment Pain Assessment: Faces Faces Pain Scale: Hurts even more Pain Location: R hip with movement Pain Descriptors / Indicators: Discomfort, Grimacing, Guarding Pain Intervention(s): Limited activity within patient's tolerance, Monitored during session, Premedicated before session, Repositioned, Ice applied    Home Living                          Prior Function            PT Goals (current goals can now be found in the care plan section) Acute Rehab PT  Goals PT Goal Formulation: With patient Time For Goal Achievement: 09/24/22 Potential to Achieve Goals: Good Progress towards PT goals: Progressing toward goals    Frequency    Min 3X/week      PT Plan Current plan remains appropriate    Co-evaluation              AM-PAC PT "6 Clicks" Mobility   Outcome Measure  Help needed turning from your back to your side while in a flat bed without using bedrails?: Total Help needed moving from lying on your back to sitting on the side of a flat bed without using bedrails?: Total Help needed moving to and from a bed to a chair (including a wheelchair)?: Total Help needed standing up from a chair using your arms (e.g., wheelchair or bedside chair)?: Total Help needed to walk in hospital room?: Total Help needed climbing 3-5 steps with a railing? : Total 6 Click Score: 6    End of Session   Activity Tolerance:  Patient tolerated treatment well Patient left: with call bell/phone within reach;in bed;with bed alarm set Nurse Communication: Mobility status PT Visit Diagnosis: Difficulty in walking, not elsewhere classified (R26.2);Pain Pain - Right/Left: Right Pain - part of body: Hip     Time: 2979-8921 PT Time Calculation (min) (ACUTE ONLY): 17 min  Charges:  $Therapeutic Activity: 8-22 mins                     Baxter Flattery, PT  Acute Rehab Dept [place]) [phone]  [facility] Weekend [facility] only)  [phone]  09/13/2022    [facility] 09/13/2022, 1:04 PM

## 2022-09-13 NOTE — TOC Progression Note (Signed)
Transition of Care [facility]) - Progression Note    Patient Details  Name: Elaine Byrd MRN: [id number] Date of Birth: [birth year]  Transition of [facility] CM/SW Contact  Lennart Pall, LCSW Phone Number: 09/13/2022, 3:00 PM  Clinical Narrative:    Met with pt today to review SNF bed offers and pt has accepted bed at [facility] who can admit her tomorrow.  MD aware.  Have begun [employer].   Expected Discharge Plan: [place] Barriers to Discharge: Continued Medical Work up, SNF Pending bed offer, Ship broker  Expected Discharge Plan and Services Expected Discharge Plan: [place] In-house Referral: Clinical Social Work   Post Acute Care Choice: [place] Living arrangements for the past 2 months: Single Family Home                 DME Arranged: N/A DME Agency: NA                   Social Determinants of Health (SDOH) Interventions    Readmission Risk Interventions    09/10/2022    1:43 PM  Readmission Risk Prevention Plan  Transportation Screening Complete  PCP or Specialist Appt within 5-7 Days Complete  Home Care Screening Complete  Medication Review (RN CM) Complete

## 2022-09-14 DIAGNOSIS — I10 Essential (primary) hypertension: Secondary | ICD-10-CM | POA: Diagnosis not present

## 2022-09-14 DIAGNOSIS — E440 Moderate protein-calorie malnutrition: Secondary | ICD-10-CM | POA: Insufficient documentation

## 2022-09-14 DIAGNOSIS — S72001A Fracture of unspecified part of neck of right femur, initial encounter for closed fracture: Secondary | ICD-10-CM | POA: Diagnosis not present

## 2022-09-14 DIAGNOSIS — R600 Localized edema: Secondary | ICD-10-CM | POA: Diagnosis not present

## 2022-09-14 MED ORDER — ENSURE ENLIVE PO LIQD
237.0000 mL | Freq: Two times a day (BID) | ORAL | Status: DC
  Administered 2022-09-14: 237 mL via ORAL

## 2022-09-14 MED ORDER — ADULT MULTIVITAMIN W/MINERALS CH
1.0000 | ORAL_TABLET | Freq: Every day | ORAL | Status: DC
  Administered 2022-09-14: 1 via ORAL
  Filled 2022-09-14: qty 1

## 2022-09-14 MED ORDER — SORBITOL 70 % SOLN
960.0000 mL | TOPICAL_OIL | Freq: Once | ORAL | Status: AC
  Administered 2022-09-14: 960 mL via RECTAL
  Filled 2022-09-14: qty 240

## 2022-09-14 NOTE — Progress Notes (Addendum)
16:16 Called facility, no answer.  16:42 Gave report to [place], all questions answered.  Pt not in acute distress. Pt to discharge to SNF with belongings via [place].

## 2022-09-14 NOTE — Progress Notes (Signed)
OT Cancellation Note  Patient Details Name: [name] MRN: [id number] DOB: 31-Jan-1946   Cancelled Treatment:    Reason Eval/Treat Not Completed: Other (comment): Pt politely declined OT this AM due to anticipating move to SNF today (Noted discharge summary in chart this morning) and also awaiting an enema.  Will keep on service for tomorrow in case d/c is delayed for any reason.  Julien Girt 09/14/2022, 11:59 AM

## 2022-09-14 NOTE — Plan of Care (Signed)
  Problem: Education: Goal: Knowledge of General Education information will improve Description: Including pain rating scale, medication(s)/side effects and non-pharmacologic comfort measures Outcome: Progressing   Problem: Health Behavior/Discharge Planning: Goal: Ability to manage health-related needs will improve Outcome: Progressing   Problem: Clinical Measurements: Goal: Ability to maintain clinical measurements within normal limits will improve Outcome: Progressing   Problem: Nutrition: Goal: Adequate nutrition will be maintained Outcome: Progressing   Problem: Coping: Goal: Level of anxiety will decrease Outcome: Progressing   Problem: Pain Managment: Goal: General experience of comfort will improve Outcome: Progressing   Problem: Safety: Goal: Ability to remain free from injury will improve Outcome: Progressing   

## 2022-09-14 NOTE — Progress Notes (Addendum)
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  GOAL:  Patient will meet greater than or equal to 90% of their needs continue  MONITOR:  PO intake, Supplement acceptance  REASON FOR ASSESSMENT:  Consult Assessment of nutrition requirement/status  ASSESSMENT:  Pt is a 76yo F with PMH of asthma, allergic rhinitis, atopic dermatitis, GERD, depression, hypothyroidism, HTN, neuropathy, and gout who presents with right hip pain after a mechanical fall.  Visited pt at bedside this afternoon. She reports a poor appetite since having covid 2 years ago. She denies weight loss but c/o generalized edema. Physical exam shows mild fat loss and mild muscle wasting. Reported po intake <75% estimated nutrient needs (yogurt in the morning with juice and maybe a sandwich in the evening or nothing). Pt meets ASPEN criteria for moderate protein calorie malnutrition r/t chronic illness. Pt also c/o significant hair loss and cut her hair very short because of it. Also notes no flavor in food. During admission poor po intake persists.   Currently on heart healthy diet, recommend liberalizing to regular to allow more menu options. Also offered Ensure/Boost supplement pt says she'll drink that all day if it's vanilla. Recommend Ensure Plus HP BID to provide 350kcal and 20g protein/day. Pt also with s/s of micronutrient deficiencies, recommend starting daily mvi, pt agreeable and reports taking one at home along with Mg and K. Pt also notes issues with alternating  diarrhea and constipation, recommend providing a daily probiotic supplement to promote bowel regularity. Discussed the importance of adequate nutrition for healing following surgery. Pt was agreeable to all nutrition interventions mentioned.   NUTRITION - FOCUSED PHYSICAL EXAM:  Flowsheet Row Most Recent Value  Orbital Region Severe depletion  Upper Arm Region Mild depletion  Thoracic and Lumbar Region Mild depletion  Buccal Region No depletion  Temple Region Severe depletion  Clavicle Bone Region Mild depletion  Clavicle and Acromion Bone Region Mild depletion  Scapular Bone Region Unable to assess  Dorsal Hand Moderate depletion  Patellar Region Mild depletion  Anterior Thigh Region No depletion  Posterior Calf Region Mild depletion  Edema (RD Assessment) Mild  Hair Reviewed  [falling out]  Eyes Reviewed  Mouth Reviewed  [no taste]  Skin Reviewed  Nails Reviewed       Diet Order:   Diet Order             Diet regular Room service appropriate? Yes; Fluid consistency: Thin  Diet effective now                   EDUCATION NEEDS:  Education needs have been addressed  Skin:  Skin Assessment: Reviewed RN Assessment  Last BM:  10/11  Height:  Ht Readings from Last 1 Encounters:  09/08/22 5\' 2"  (1.575 m)    Weight:  Wt Readings from Last 1 Encounters:  09/08/22 89.8 kg   Ideal Body Weight:  50 kg  BMI:  Body mass index is 36.21 kg/m.  Estimated Nutritional Needs:  Kcal:  1600-1975kcal  Protein:  75-100g  Fluid:  >1629mL  Candise Bowens, MS, RD, LDN, CNSC  See [facility] for contact information

## 2022-09-14 NOTE — Discharge Summary (Signed)
Physician Discharge Summary  Elaine Byrd [id number] DOB: Aug 13, 1946 DOA: 09/08/2022  PCP: Karleen [place]., MD  Admit date: 09/08/2022 Discharge date: 09/14/2022  Admitted From: home Disposition:  SNF  Recommendations for Outpatient Follow-up:  Follow up with PCP in 1-2 weeks Please obtain BMP/CBC in one week Follow up with [facility] in 1-2 weeks TDWB RLE with walker  Discharge Condition:stable CODE STATUS:DNR Diet recommendation: heart healthy  Brief/Interim Summary: 76 y/o female admitted to the hospital after mechanical fall resulting in right hip fracture. She also complains of chest pain that appears to musculoskeletal in origin. She underwent operative management of her hip that was uncomplicated. She is ready for discharge to SNF when bed available  Discharge Diagnoses:  Principal Problem:   Hip fracture ([place]) Active Problems:   Depression   Hypothyroidism   GERD (gastroesophageal reflux disease)   Essential hypertension   GAD (generalized anxiety disorder)   Moderate persistent asthma   Chest pain   Leukocytosis   Bilateral lower extremity edema  Right hip fracture Mechanical fall at home -orthopedics following for operative management -pain control -PT/OT with recommendations for skilled nursing facility -ASA for DVT prophylaxis per ortho TDWB with walker on RLE -follow up ortho in 1-2 weeks   Chest pain -appears to be quite atypical and suggestive of musculoskeletal origin -trops negative and EKG non acute -Echo done with report pending -D dimer elevated, but likely related to recent injury -she is not hypoxic, tachycardic and pain is reproducible on palpation -non contrast CT chest done on admission did not show any acute bony or parenchymal abnormalities -Wells score for PE and DVT is 0 -Echocardiogram shows normal EF and normal RV function -Suspect pain is musculoskeletal in origin.  Will need further pain management   Chronic b/l LE  edema -appears to have stasis changes bilaterally -chronically takes lasix and Aldactone -echo with normal EF -BNP normal   Leukocytosis -suspect stress response due to hip fracture -procalcitonin negative -no other signs of infection -back to normal range   Moderate persistent asthma -no wheezing at this time -continue bronchodilators, inhaled steroids -Chest x-ray without any signs of pneumonia -continue incentive spirometry -currently on RA   Hypothyroidism -continue on synthroid   Gout -continue allopurinol   Anemia, macrocytic -likely multifactorial -B12 low at 68 -> replace -Folate low 5.8 -> replace -also likely has component of blood loss from surgery -Hemoglobin currently stable -continue to monitor  Discharge Instructions   Allergies as of 09/14/2022       Reactions   Morphine And Related Other (See Comments)   hallucinations   Penicillins Other (See Comments)   Childhood reaction, cannot remember   Sulfa Antibiotics Other (See Comments), Diarrhea   itchiness        Medication List     STOP taking these medications    traMADol 50 MG tablet Commonly known as: ULTRAM       TAKE these medications    acetaminophen 500 MG tablet Commonly known as: TYLENOL Take 1,000 mg by mouth every 6 (six) hours as needed for moderate pain.   albuterol 108 (90 Base) MCG/ACT inhaler Commonly known as: Ventolin HFA Inhale 2 puffs into the lungs every 4 (four) hours as needed for wheezing or shortness of breath.   allopurinol 300 MG tablet Commonly known as: ZYLOPRIM Take 300 mg by mouth daily.   aspirin 81 MG chewable tablet Commonly known as: Aspirin Childrens Chew 1 tablet (81 mg total) by mouth 2 (two) times  daily with a meal.   B COMPLETE PO Take 50 mg by mouth daily.   Breztri Aerosphere 160-9-4.8 MCG/ACT Aero Generic drug: Budeson-Glycopyrrol-Formoterol INHALE 2 PUFFS TWICE DAILY   CLARITIN-D 12 HOUR PO Take 1 tablet by mouth daily.    clonazePAM 2 MG tablet Commonly known as: KLONOPIN Take 0.5 tablets (1 mg total) by mouth 3 (three) times daily as needed for anxiety. What changed:  when to take this reasons to take this   co-enzyme Q-10 30 MG capsule Take 100 mg by mouth daily.   colchicine 0.6 MG tablet Take 0.6 mg by mouth daily.   CULTURELLE PROBIOTICS PO Take 1 capsule by mouth 2 (two) times daily.   cyanocobalamin 1000 MCG tablet Take 1 tablet (1,000 mcg total) by mouth daily.   docusate sodium 100 MG capsule Commonly known as: COLACE Take 1 capsule (100 mg total) by mouth 2 (two) times daily.   Dupixent 300 MG/2ML prefilled syringe Generic drug: dupilumab Inject 300 mg into the skin every 14 (fourteen) days.   EPINEPHrine 0.3 mg/0.3 mL Soaj injection Commonly known as: EPI-PEN Use as directed for severe allergic reactions   Ester-C 500-550 MG Tabs Take 1 tablet by mouth daily.   folic acid 1 MG tablet Commonly known as: FOLVITE Take 1 tablet (1 mg total) by mouth daily.   furosemide 20 MG tablet Commonly known as: LASIX Take 80 mg by mouth daily.   HYDROcodone-acetaminophen 10-325 MG tablet Commonly known as: Norco Take 0.5 tablets by mouth every 4 (four) hours as needed for up to 7 days for severe pain.   hydrOXYzine 25 MG tablet Commonly known as: ATARAX Take 1 tablet (25 mg total) by mouth 3 (three) times daily.   levothyroxine 100 MCG tablet Commonly known as: SYNTHROID Take 100 mcg by mouth daily before breakfast.   magnesium 30 MG tablet Take 30 mg by mouth daily.   Multivitamin Adult Tabs Take 1 tablet by mouth daily.   pantoprazole 40 MG tablet Commonly known as: PROTONIX Take 1 tablet by mouth daily.   polyethylene glycol 17 g packet Commonly known as: MIRALAX / GLYCOLAX Take 17 g by mouth daily.   potassium chloride 10 MEQ tablet Commonly known as: KLOR-CON M Take 10 mEq by mouth 2 (two) times daily.   primidone 50 MG tablet Commonly known as:  MYSOLINE Take 50 mg by mouth 2 (two) times daily.   spironolactone 25 MG tablet Commonly known as: ALDACTONE Take 25 mg by mouth daily.   tiZANidine 4 MG tablet Commonly known as: ZANAFLEX Take 0.5 tablets (2 mg total) by mouth 3 (three) times daily as needed for muscle spasms. What changed:  when to take this reasons to take this   VITAMIN K2-VITAMIN D3 PO Take 5,000 mg by mouth daily.        Contact information for follow-up providers     Rod Can, MD Follow up in 2 week(s).   Specialty: Orthopedic Surgery Why: For suture removal, For wound re-check Contact information: [address] [place] [address] [phone]              Contact information for after-discharge care     Destination     [facility] .   Service: Skilled Nursing Contact information: [place] [address] [phone]                    Allergies  Allergen Reactions   Morphine And Related  Other (See Comments)    hallucinations   Penicillins Other (See Comments)    Childhood reaction, cannot remember   Sulfa Antibiotics Other (See Comments) and Diarrhea    itchiness    Consultations: ortho   Procedures/Studies: DG CHEST PORT 1 VIEW  Result Date: 09/11/2022 CLINICAL DATA:  Chest pain and shortness of breath. EXAM: PORTABLE CHEST 1 VIEW COMPARISON:  September 08, 2022 FINDINGS: The heart size and mediastinal contours are stable. Heart size is enlarged. Both lungs are clear. The visualized skeletal structures are unremarkable. IMPRESSION: No active cardiopulmonary disease identified. Electronically Signed   By: Abelardo Diesel M.D.   On: 09/11/2022 12:00   Pelvis Portable  Result Date: 09/09/2022 CLINICAL DATA:  Closed. Trochanteric fracture of femur. Post right IM nail. EXAM: PORTABLE PELVIS 1-2 VIEWS COMPARISON:  09/08/2022 FINDINGS: Postoperative changes with interval intramedullary rod and  compression screw fixation of an inter trochanteric fracture of the proximal right femur. Inferior aspect of the intramedullary rod is not included within the field of view. Fracture lines remain visible. Soft tissue gas consistent with recent surgery. Mild valgus orientation is suggested. No dislocation of the hip joint. Degenerative changes in the hips and lower lumbar spine. IMPRESSION: Postoperative internal fixation of inter trochanteric right hip fracture. Electronically Signed   By: Lucienne Capers M.D.   On: 09/09/2022 19:04   DG FEMUR PORT, MIN 2 VIEWS RIGHT  Result Date: 09/09/2022 CLINICAL DATA:  Closed peritrochanteric fracture of femur, right, initial encounter. Status post intramedullary nail. EXAM: RIGHT FEMUR PORTABLE 2 VIEW COMPARISON:  Right hip radiographs 09/08/2022 FINDINGS: Interval long cephalomedullary nail fixation of the previously seen distal right intertrochanteric fracture. There is improved, near anatomic alignment. No perihardware lucency is seen to indicate hardware failure or loosening. Severe right femoroacetabular joint space narrowing. Diffuse decreased bone mineralization. IMPRESSION: Interval long cephalomedullary nail fixation of the previously seen distal right intertrochanteric fracture with improved, near anatomic alignment. Electronically Signed   By: Yvonne Kendall M.D.   On: 09/09/2022 17:15   DG FEMUR, MIN 2 VIEWS RIGHT  Result Date: 09/09/2022 CLINICAL DATA:  Fluoroscopic assistance for internal fixation of fracture of right femur EXAM: RIGHT FEMUR 2 VIEWS COMPARISON:  09/08/2022 FINDINGS: Fluoroscopic images show reduction and internal fixation of fracture of proximal shaft of right femur with intramedullary rod. Fluoroscopic time 1 minutes and 29 seconds. Radiation dose 14.62 mGy. IMPRESSION: Fluoroscopic assistance was provided for reduction and internal fixation of fracture of proximal right femur. Electronically Signed   By: Elmer Picker M.D.   On:  09/09/2022 15:32   DG C-Arm 1-60 Min-No Report  Result Date: 09/09/2022 Fluoroscopy was utilized by the requesting physician.  No radiographic interpretation.   DG C-Arm 1-60 Min-No Report  Result Date: 09/09/2022 Fluoroscopy was utilized by the requesting physician.  No radiographic interpretation.   ECHOCARDIOGRAM COMPLETE  Result Date: 09/09/2022    ECHOCARDIOGRAM REPORT   Patient Name:   OAKLEY ORBAN Date of Exam: 09/09/2022 Medical Rec #:  [id number]    Height:       62.0 in Accession #:    [id number]   Weight:       198.0 lb Date of Birth:  August 18, 1946    BSA:          1.904 m Patient Age:    22 years     BP:           130/75 mmHg Patient Gender: F  HR:           76 bpm. Exam Location:  Inpatient Procedure: 2D Echo, Color Doppler and Cardiac Doppler Indications:    R07.9* Chest pain, unspecified, Pre-op evaluation  History:        Patient has no prior history of Echocardiogram examinations.  Sonographer:    Raquel Sarna Senior RDCS Referring Phys: [id number] [place]  1. Left ventricular ejection fraction, by estimation, is 60 to 65%. The left ventricle has normal function. The left ventricle has no regional wall motion abnormalities. Left ventricular diastolic parameters are consistent with Grade I diastolic dysfunction (impaired relaxation).  2. Right ventricular systolic function is normal. The right ventricular size is normal. There is normal pulmonary artery systolic pressure.  3. The mitral valve is normal in structure. Trivial mitral valve regurgitation. No evidence of mitral stenosis.  4. The aortic valve is tricuspid. There is moderate calcification of the aortic valve. Aortic valve regurgitation is not visualized. No aortic stenosis is present.  5. Aortic dilatation noted. There is mild dilatation of the aortic root, measuring 37 mm.  6. The inferior vena cava is normal in size with greater than 50% respiratory variability, suggesting right atrial pressure of 3 mmHg.  FINDINGS  Left Ventricle: Left ventricular ejection fraction, by estimation, is 60 to 65%. The left ventricle has normal function. The left ventricle has no regional wall motion abnormalities. The left ventricular internal cavity size was normal in size. There is  no left ventricular hypertrophy. Left ventricular diastolic parameters are consistent with Grade I diastolic dysfunction (impaired relaxation). Indeterminate filling pressures. Right Ventricle: The right ventricular size is normal. No increase in right ventricular wall thickness. Right ventricular systolic function is normal. There is normal pulmonary artery systolic pressure. The tricuspid regurgitant velocity is 2.57 m/s, and  with an assumed right atrial pressure of 3 mmHg, the estimated right ventricular systolic pressure is [id number] mmHg. Left Atrium: Left atrial size was normal in size. Right Atrium: Right atrial size was normal in size. Pericardium: There is no evidence of pericardial effusion. Mitral Valve: The mitral valve is normal in structure. Trivial mitral valve regurgitation. No evidence of mitral valve stenosis. Tricuspid Valve: The tricuspid valve is normal in structure. Tricuspid valve regurgitation is trivial. No evidence of tricuspid stenosis. Aortic Valve: The aortic valve is tricuspid. There is moderate calcification of the aortic valve. Aortic valve regurgitation is not visualized. No aortic stenosis is present. Pulmonic Valve: The pulmonic valve was normal in structure. Pulmonic valve regurgitation is not visualized. No evidence of pulmonic stenosis. Aorta: Aortic dilatation noted. There is mild dilatation of the aortic root, measuring 37 mm. Venous: The inferior vena cava is normal in size with greater than 50% respiratory variability, suggesting right atrial pressure of 3 mmHg. IAS/Shunts: No atrial level shunt detected by color flow Doppler.  LEFT VENTRICLE PLAX 2D LVIDd:         4.50 cm   Diastology LVIDs:         2.90 cm   LV e'  medial:    6.85 cm/s LV PW:         0.90 cm   LV E/e' medial:  13.2 LV IVS:        0.80 cm   LV e' lateral:   10.30 cm/s LVOT diam:     1.90 cm   LV E/e' lateral: 8.8 LV SV:         59 LV SV Index:   31 LVOT Area:  2.84 cm  RIGHT VENTRICLE RV S prime:     10.80 cm/s TAPSE (M-mode): 1.8 cm LEFT ATRIUM             Index        RIGHT ATRIUM           Index LA diam:        2.60 cm 1.37 cm/m   RA Area:     15.70 cm LA Vol (A2C):   33.1 ml 17.39 ml/m  RA Volume:   38.50 ml  20.23 ml/m LA Vol (A4C):   43.5 ml 22.85 ml/m LA Biplane Vol: 40.4 ml 21.22 ml/m  AORTIC VALVE LVOT Vmax:   102.00 cm/s LVOT Vmean:  69.000 cm/s LVOT VTI:    0.208 m  AORTA Ao Root diam: 3.70 cm Ao Asc diam:  3.50 cm MITRAL VALVE                TRICUSPID VALVE MV Area (PHT): 3.45 cm     TR Peak grad:   26.4 mmHg MV Decel Time: 220 msec     TR Vmax:        257.00 cm/s MV E velocity: 90.40 cm/s MV A velocity: 103.00 cm/s  SHUNTS MV E/A ratio:  0.88         Systemic VTI:  0.21 m                             Systemic Diam: 1.90 cm Skeet Latch MD Electronically signed by Skeet Latch MD Signature Date/Time: 09/09/2022/10:57:10 AM    Final    DG Shoulder Right  Result Date: 09/08/2022 CLINICAL DATA:  Pain and limited movement after a fall. EXAM: RIGHT SHOULDER - 2+ VIEW COMPARISON:  None Available. FINDINGS: No acute fracture or dislocation demonstrated in the right shoulder. No focal bone lesion or bone destruction. Soft tissues are unremarkable. IMPRESSION: No acute bony abnormalities are demonstrated. Electronically Signed   By: Lucienne Capers M.D.   On: 09/08/2022 21:16   DG Knee Complete 4 Views Right  Result Date: 09/08/2022 CLINICAL DATA:  Pain after a fall. EXAM: RIGHT KNEE - COMPLETE 4+ VIEW COMPARISON:  None Available. FINDINGS: Evaluation is limited due to nonstandard positioning. As visualized, no acute displaced fractures are identified. No focal bone lesions. No significant effusion. IMPRESSION: Technically  limited study due to nonstandard positioning. No acute displaced fractures are visualized. Electronically Signed   By: Lucienne Capers M.D.   On: 09/08/2022 21:14   DG Hip Unilat W or Wo Pelvis 2-3 Views Right  Result Date: 09/08/2022 CLINICAL DATA:  Pain and deformity after a fall. EXAM: DG HIP (WITH OR WITHOUT PELVIS) 2-3V RIGHT COMPARISON:  None Available. FINDINGS: Comminuted inter trochanteric fractures of the proximal right femur with medial displacement and impaction of the distal fracture fragments resulting in varus angulation. Degenerative changes in the right hip. No dislocation of the hip joint. Degenerative changes in the lower lumbar spine and left hip. SI joints and symphysis pubis are not displaced. IMPRESSION: Comminuted inter trochanteric fractures of the proximal right femur with varus angulation. Electronically Signed   By: Lucienne Capers M.D.   On: 09/08/2022 21:14   CT Chest Wo Contrast  Result Date: 09/08/2022 CLINICAL DATA:  Chest trauma, blunt.  Fall EXAM: CT CHEST WITHOUT CONTRAST TECHNIQUE: Multidetector CT imaging of the chest was performed following the standard protocol without IV contrast. RADIATION DOSE REDUCTION: This exam was performed according  to the departmental dose-optimization program which includes automated exposure control, adjustment of the mA and/or kV according to patient size and/or use of iterative reconstruction technique. COMPARISON:  None Available. FINDINGS: Cardiovascular: Mild coronary artery calcification. Global cardiac size within normal limits. No pericardial effusion. Central pulmonary arteries are enlarged in keeping with changes of pulmonary arterial hypertension. Moderate atherosclerotic calcification within the thoracic aorta. No aortic aneurysm. Mediastinum/Nodes: No pathologic thoracic adenopathy. Esophagus unremarkable. Lungs/Pleura: Lungs are clear. No pleural effusion or pneumothorax. Upper Abdomen: No acute abnormality.  Status post  cholecystectomy Musculoskeletal: Remote compression deformities of T11 and T12 are noted. Healed right fifth rib and scapular fracture formed ease are noted. Osseous structures are otherwise age-appropriate. No acute bone abnormality. No lytic or blastic bone lesion IMPRESSION: 1. No acute intrathoracic pathology identified. 2. Mild coronary artery calcification. 3. Enlarged central pulmonary arteries in keeping with changes of pulmonary arterial hypertension. Electronically Signed   By: Fidela Salisbury M.D.   On: 09/08/2022 20:54      Subjective: Reports pain is reasonably controlled  Discharge Exam: Vitals:   09/13/22 2148 09/14/22 0551 09/14/22 0700 09/14/22 1028  BP: (!) 148/71 (!) 142/63  (!) 153/75  Pulse: 99 91  91  Resp: 17 18  16   Temp: 98.7 F (37.1 C) 98.9 F (37.2 C)  98.6 F (37 C)  TempSrc: Oral Oral  Oral  SpO2: 95% 92% 94% 96%  Weight:      Height:        General: Pt is alert, awake, not in acute distress Cardiovascular: RRR, S1/S2 +, no rubs, no gallops Respiratory: CTA bilaterally, no wheezing, no rhonchi Abdominal: Soft, NT, ND, bowel sounds + Extremities: no edema, no cyanosis    The results of significant diagnostics from this hospitalization (including imaging, microbiology, ancillary and laboratory) are listed below for reference.     Microbiology: Recent Results (from the past 240 hour(s))  MRSA Next Gen by PCR, Nasal     Status: None   Collection Time: 09/09/22  9:37 AM   Specimen: Nasal Mucosa; Nasal Swab  Result Value Ref Range Status   MRSA by PCR Next Gen NOT DETECTED NOT DETECTED Final    Comment: (NOTE) The GeneXpert MRSA Assay (FDA approved for NASAL specimens only), is one component of a comprehensive MRSA colonization surveillance program. It is not intended to diagnose MRSA infection nor to guide or monitor treatment for MRSA infections. Test performance is not FDA approved in patients less than 13 years old. Performed at [facility], [place] [address], [place], [place] [address]      Labs: BNP (last 3 results) Recent Labs    09/11/22 0323  BNP [id number]   Basic Metabolic Panel: Recent Labs  Lab 09/08/22 2030 09/10/22 0325 09/11/22 0323 09/12/22 0324  NA 137 136 136 137  K 3.6 4.1 3.8 3.9  CL 103 103 100 98  CO2 23 26 30 31   GLUCOSE 174* 137* 105* 126*  BUN 15 16 13 13   CREATININE 0.92 0.81 0.71 0.81  CALCIUM 9.2 8.3* 8.3* 8.6*   Liver Function Tests: No results for input(s): "AST", "ALT", "ALKPHOS", "BILITOT", "PROT", "ALBUMIN" in the last 168 hours. No results for input(s): "LIPASE", "AMYLASE" in the last 168 hours. No results for input(s): "AMMONIA" in the last 168 hours. CBC: Recent Labs  Lab 09/08/22 2030 09/09/22 0353 09/10/22 0325 09/11/22 0323 09/12/22 0324  WBC 25.3* 18.8* 14.7* 9.7 10.0  NEUTROABS  --  15.8*  --   --   --  HGB 13.4 12.8 9.9* 8.5* 9.3*  HCT 40.9 39.2 31.1* 26.9* 29.2*  MCV 100.5* 102.6* 103.7* 103.5* 103.9*  PLT 323 283 209 168 193   Cardiac Enzymes: No results for input(s): "CKTOTAL", "CKMB", "CKMBINDEX", "TROPONINI" in the last 168 hours. BNP: Invalid input(s): "POCBNP" CBG: No results for input(s): "GLUCAP" in the last 168 hours. D-Dimer No results for input(s): "DDIMER" in the last 72 hours. Hgb A1c No results for input(s): "HGBA1C" in the last 72 hours. Lipid Profile No results for input(s): "CHOL", "HDL", "LDLCALC", "TRIG", "CHOLHDL", "LDLDIRECT" in the last 72 hours. Thyroid function studies No results for input(s): "TSH", "T4TOTAL", "T3FREE", "THYROIDAB" in the last 72 hours.  Invalid input(s): "FREET3" Anemia work up No results for input(s): "VITAMINB12", "FOLATE", "FERRITIN", "TIBC", "IRON", "RETICCTPCT" in the last 72 hours. Urinalysis    Component Value Date/Time   COLORURINE YELLOW 09/09/2022 1842   APPEARANCEUR HAZY (A) 09/09/2022 1842   LABSPEC 1.032 (H) 09/09/2022 1842   PHURINE 5.0 09/09/2022 1842   GLUCOSEU NEGATIVE  09/09/2022 1842   HGBUR NEGATIVE 09/09/2022 1842   BILIRUBINUR NEGATIVE 09/09/2022 1842   KETONESUR 5 (A) 09/09/2022 1842   PROTEINUR 30 (A) 09/09/2022 1842   UROBILINOGEN 0.2 02/01/2013 2002   NITRITE NEGATIVE 09/09/2022 1842   LEUKOCYTESUR NEGATIVE 09/09/2022 1842   Sepsis Labs Recent Labs  Lab 09/09/22 0353 09/10/22 0325 09/11/22 0323 09/12/22 0324  WBC 18.8* 14.7* 9.7 10.0   Microbiology Recent Results (from the past 240 hour(s))  MRSA Next Gen by PCR, Nasal     Status: None   Collection Time: 09/09/22  9:37 AM   Specimen: Nasal Mucosa; Nasal Swab  Result Value Ref Range Status   MRSA by PCR Next Gen NOT DETECTED NOT DETECTED Final    Comment: (NOTE) The GeneXpert MRSA Assay (FDA approved for NASAL specimens only), is one component of a comprehensive MRSA colonization surveillance program. It is not intended to diagnose MRSA infection nor to guide or monitor treatment for MRSA infections. Test performance is not FDA approved in patients less than 34 years old. Performed at [facility], [place] [address], [place], [place] [address]      Time coordinating discharge: 29mins  SIGNED:   Kathie Dike, MD  [facility] 09/14/2022, 10:58 AM   If 7PM-7AM, please contact night-coverage [email or web address]

## 2022-10-28 ENCOUNTER — Ambulatory Visit: Payer: Medicare HMO | Admitting: Family Medicine

## 2022-12-11 ENCOUNTER — Other Ambulatory Visit: Payer: Self-pay | Admitting: Family Medicine

## 2023-01-05 ENCOUNTER — Other Ambulatory Visit: Payer: Self-pay | Admitting: Family Medicine

## 2023-03-16 ENCOUNTER — Other Ambulatory Visit: Payer: Self-pay | Admitting: *Deleted

## 2023-03-16 MED ORDER — DUPIXENT 300 MG/2ML ~~LOC~~ SOSY: 300.0000 mg | PREFILLED_SYRINGE | SUBCUTANEOUS | 11 refills | Status: DC

## 2023-03-21 ENCOUNTER — Other Ambulatory Visit: Payer: Self-pay | Admitting: Family

## 2023-11-30 ENCOUNTER — Other Ambulatory Visit: Payer: Self-pay | Admitting: *Deleted

## 2023-11-30 MED ORDER — DUPIXENT 300 MG/2ML ~~LOC~~ SOSY: 300.0000 mg | PREFILLED_SYRINGE | SUBCUTANEOUS | 11 refills | Status: AC

## 2024-06-22 ENCOUNTER — Ambulatory Visit (INDEPENDENT_AMBULATORY_CARE_PROVIDER_SITE_OTHER): Admitting: Otolaryngology

## 2024-06-22 VITALS — BP 122/77 | HR 82 | Ht 62.0 in | Wt 170.0 lb

## 2024-06-22 DIAGNOSIS — J343 Hypertrophy of nasal turbinates: Secondary | ICD-10-CM

## 2024-06-22 DIAGNOSIS — J310 Chronic rhinitis: Secondary | ICD-10-CM | POA: Diagnosis not present

## 2024-06-22 DIAGNOSIS — J324 Chronic pansinusitis: Secondary | ICD-10-CM

## 2024-06-22 DIAGNOSIS — J3489 Other specified disorders of nose and nasal sinuses: Secondary | ICD-10-CM

## 2024-06-22 DIAGNOSIS — Z87891 Personal history of nicotine dependence: Secondary | ICD-10-CM | POA: Diagnosis not present

## 2024-06-22 DIAGNOSIS — R0981 Nasal congestion: Secondary | ICD-10-CM | POA: Diagnosis not present

## 2024-06-24 DIAGNOSIS — J343 Hypertrophy of nasal turbinates: Secondary | ICD-10-CM | POA: Insufficient documentation

## 2024-06-24 DIAGNOSIS — J324 Chronic pansinusitis: Secondary | ICD-10-CM | POA: Insufficient documentation

## 2024-06-24 NOTE — Progress Notes (Signed)
 Patient ID: Elaine Byrd, female   DOB: 08-14-1946, 78 y.o.   MRN: [id number]  CC: Recurrent sinusitis, chronic nasal obstruction  HPI:  EDLYN Byrd is a 78 y.o. female who presents today complaining of frequent recurrent rhinosinusitis and chronic nasal obstruction.  Her symptoms include facial pain and pressure, nasal congestion, and nasal drainage.  She was treated with multiple courses of antibiotics over the past year.  The last 1 was 1 week ago.  She has a history of environmental allergies.  She uses Claritin -D daily.  She previously underwent sinus surgery more than 30 years ago.  Currently she denies any fever or visual change.  Past Medical History:  Diagnosis Date   Acid reflux    Asthma    Depression    Hypothyroidism    Neuropathy     Past Surgical History:  Procedure Laterality Date   ABDOMINAL HYSTERECTOMY     CATARACT EXTRACTION BILATERAL W/ ANTERIOR VITRECTOMY Bilateral 12/15 and 01/16   CERVICAL LAMINECTOMY     CHOLECYSTECTOMY     FEMUR IM NAIL Right 09/09/2022   Procedure: INTRAMEDULLARY (IM) NAIL FEMORAL;  Surgeon: Fidel Rogue, MD;  Location: [facility];  Service: Orthopedics;  Laterality: Right;   LUMBAR LAMINECTOMY     NASAL SINUS SURGERY     NECK SURGERY     ROTATOR CUFF REPAIR Right 12/18/2019    Family History  Problem Relation Age of Onset   Depression Sister    Suicidality Sister    Bipolar disorder Sister    Depression Mother    Allergic rhinitis Mother    Angioedema Neg Hx    Asthma Neg Hx    Eczema Neg Hx    Immunodeficiency Neg Hx    Urticaria Neg Hx     Social History:  reports that she quit smoking about 43 years ago. Her smoking use included cigarettes. She started smoking about 53 years ago. She has a 10 pack-year smoking history. She has never used smokeless tobacco. She reports current alcohol  use of about 1.0 standard drink of alcohol  per week. She reports that she does not use drugs.  Allergies:  Allergies  Allergen Reactions    Morphine And Codeine Other (See Comments)    hallucinations   Penicillins Other (See Comments)    Childhood reaction, cannot remember   Sulfa Antibiotics Other (See Comments) and Diarrhea    itchiness    Prior to Admission medications   Medication Sig Start Date End Date Taking? Authorizing Provider  acetaminophen  (TYLENOL ) 500 MG tablet Take 1,000 mg by mouth every 6 (six) hours as needed for moderate pain.   Yes [provider]  albuterol  (VENTOLIN  HFA) 108 (90 Base) MCG/ACT inhaler Inhale 2 puffs into the lungs every 4 (four) hours as needed for wheezing or shortness of breath. 12/25/20  Yes Ambs, Arlean HERO, FNP  allopurinol  (ZYLOPRIM ) 300 MG tablet Take 300 mg by mouth daily. 01/26/22  Yes [provider]  B Complex-Biotin-FA (B COMPLETE PO) Take 50 mg by mouth daily.   Yes [provider]  Bioflavonoid Products (ESTER-C) 500-550 MG TABS Take 1 tablet by mouth daily.   Yes [provider]  BREZTRI  AEROSPHERE 160-9-4.8 MCG/ACT AERO INHALE 2 PUFFS TWICE DAILY (NEED AN OFFICE VISIT FOR FURTHER REFILLS) 01/06/23  Yes Cheryl Reusing, FNP  clonazePAM  (KLONOPIN ) 2 MG tablet Take 0.5 tablets (1 mg total) by mouth 3 (three) times daily as needed for anxiety. 09/13/22  Yes Antoinette Doe, MD  co-enzyme Q-10 30  MG capsule Take 100 mg by mouth daily.   Yes [provider]  colchicine 0.6 MG tablet Take 0.6 mg by mouth daily. 07/12/22  Yes [provider]  cyanocobalamin  1000 MCG tablet Take 1 tablet (1,000 mcg total) by mouth daily. 09/13/22  Yes Antoinette Doe, MD  docusate sodium  (COLACE) 100 MG capsule Take 1 capsule (100 mg total) by mouth 2 (two) times daily. 09/13/22  Yes Antoinette Doe, MD  dupilumab  (DUPIXENT ) 300 MG/2ML prefilled syringe Inject 300 mg into the skin every 14 (fourteen) days. 11/30/23  Yes Iva Marty Saltness, MD  EPINEPHrine  0.3 mg/0.3 mL IJ SOAJ injection Use as directed for severe allergic reactions 07/16/22  Yes Ambs, Arlean HERO,  FNP  folic acid  (FOLVITE ) 1 MG tablet Take 1 tablet (1 mg total) by mouth daily. 09/14/22  Yes Antoinette Doe, MD  furosemide  (LASIX ) 20 MG tablet Take 80 mg by mouth daily.   Yes [provider]  hydrOXYzine  (ATARAX /VISTARIL ) 25 MG tablet Take 1 tablet (25 mg total) by mouth 3 (three) times daily. 04/25/19  Yes Bardelas, Jose A, MD  levothyroxine  (SYNTHROID , LEVOTHROID) 100 MCG tablet Take 100 mcg by mouth daily before breakfast.   Yes [provider]  Loratadine -Pseudoephedrine  (CLARITIN -D 12 HOUR PO) Take 1 tablet by mouth daily.   Yes [provider]  magnesium  30 MG tablet Take 30 mg by mouth daily.   Yes [provider]  Multiple Vitamins-Minerals (MULTIVITAMIN ADULT) TABS Take 1 tablet by mouth daily.   Yes [provider]  pantoprazole  (PROTONIX ) 40 MG tablet Take 1 tablet by mouth daily. 08/10/21  Yes [provider]  polyethylene glycol (MIRALAX  / GLYCOLAX ) 17 g packet Take 17 g by mouth daily. 09/13/22  Yes Antoinette Doe, MD  potassium chloride  (KLOR-CON  M) 10 MEQ tablet Take 10 mEq by mouth 2 (two) times daily. 01/17/22  Yes [provider]  primidone  (MYSOLINE ) 50 MG tablet Take 50 mg by mouth 2 (two) times daily. 06/08/22  Yes [provider]  Probiotic Product (CULTURELLE PROBIOTICS PO) Take 1 capsule by mouth 2 (two) times daily.   Yes [provider]  spironolactone  (ALDACTONE ) 25 MG tablet Take 25 mg by mouth daily. 07/05/22  Yes [provider]  tiZANidine  (ZANAFLEX ) 4 MG tablet Take 0.5 tablets (2 mg total) by mouth 3 (three) times daily as needed for muscle spasms. 09/13/22  Yes Antoinette Doe, MD  Vitamin D-Vitamin K (VITAMIN K2-VITAMIN D3 PO) Take 5,000 mg by mouth daily.   Yes [provider]    Blood pressure 122/77, pulse 82, height 5' 2 (1.575 m), weight 170 lb (77.1 kg). Exam: General: Communicates without difficulty, well nourished, no acute distress. Head: Normocephalic,  no evidence injury, no tenderness, facial buttresses intact without stepoff. Face/sinus: No tenderness to palpation and percussion. Facial movement is normal and symmetric. Eyes: PERRL, EOMI. No scleral icterus, conjunctivae clear. Neuro: CN II exam reveals vision grossly intact.  No nystagmus at any point of gaze. Ears: Auricles well formed without lesions.  Ear canals are intact without mass or lesion.  No erythema or edema is appreciated.  The TMs are intact without fluid. Nose: External evaluation reveals normal support and skin without lesions.  Dorsum is intact.  Anterior rhinoscopy reveals congested mucosa over anterior aspect of inferior turbinates and deviated septum.  A septal perforation is noted.  Oral:  Oral cavity and oropharynx are intact, symmetric, without erythema or edema.  Mucosa is moist without lesions. Neck: Full range of motion  without pain.  There is no significant lymphadenopathy.  No masses palpable.  Thyroid bed within normal limits to palpation.  Parotid glands and submandibular glands equal bilaterally without mass.  Trachea is midline. Neuro:  CN 2-12 grossly intact.   Procedure:  Flexible Nasal Endoscopy: Description: Risks, benefits, and alternatives of flexible endoscopy were explained to the patient.  Specific mention was made of the risk of throat numbness with difficulty swallowing, possible bleeding from the nose and mouth, and pain from the procedure.  The patient gave oral consent to proceed.  The flexible scope was inserted into the right nasal cavity.  Endoscopy of the interior nasal cavity, superior, inferior, and middle meatus was performed. The sphenoid-ethmoid recess was examined. Edematous mucosa was noted.  No polyp, mass, or lesion was appreciated.  A septal perforation was noted.  Olfactory cleft was clear.  Nasopharynx was clear.  Turbinates were hypertrophied but without mass.  The procedure was repeated on the contralateral side with similar findings.  The  patient tolerated the procedure well.   Assessment: 1.  Chronic rhinitis with nasal mucosal congestion and bilateral inferior turbinate hypertrophy. 2.  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 3.  No polyps, mass, lesion, or purulent drainage is noted today.  Plan: 1.  The physical exam and nasal endoscopy findings are reviewed with the patient. 2.  The patient is reassured that no acute infection is noted today. 3.  Flonase  nasal spray 2 sprays each nostril daily.  The importance of consistent daily use is discussed. 4.  The patient is encouraged to call with any questions or concerns.  If her symptoms persist, we will proceed with sinus CT scan to evaluate for chronic sinusitis.  Sanyia Dini W Docia Klar 06/24/2024, 7:49 AM

## 2024-12-03 DIAGNOSIS — H4911 Fourth [trochlear] nerve palsy, right eye: Secondary | ICD-10-CM

## 2024-12-07 ENCOUNTER — Ambulatory Visit (HOSPITAL_COMMUNITY)
Admission: RE | Admit: 2024-12-07 | Discharge: 2024-12-07 | Disposition: A | Source: Ambulatory Visit | Attending: Ophthalmology

## 2024-12-07 DIAGNOSIS — D320 Benign neoplasm of cerebral meninges: Secondary | ICD-10-CM | POA: Diagnosis not present

## 2024-12-07 DIAGNOSIS — H4911 Fourth [trochlear] nerve palsy, right eye: Secondary | ICD-10-CM | POA: Diagnosis present

## 2024-12-07 MED ORDER — GADOBUTROL 1 MMOL/ML IV SOLN
7.7000 mL | Freq: Once | INTRAVENOUS | Status: AC | PRN
  Administered 2024-12-07: 7.7 mL via INTRAVENOUS
